# Patient Record
Sex: Male | Born: 1955 | Race: Black or African American | Hispanic: No | Marital: Married | Smoking: Current some day smoker
Health system: Southern US, Community
[De-identification: ages and names within clinical notes are randomized; demographics above are authoritative.]

## PROBLEM LIST (undated history)

## (undated) DIAGNOSIS — I10 Essential (primary) hypertension: Secondary | ICD-10-CM

## (undated) DIAGNOSIS — E119 Type 2 diabetes mellitus without complications: Secondary | ICD-10-CM

## (undated) DIAGNOSIS — D869 Sarcoidosis, unspecified: Secondary | ICD-10-CM

## (undated) DIAGNOSIS — J449 Chronic obstructive pulmonary disease, unspecified: Secondary | ICD-10-CM

## (undated) DIAGNOSIS — J189 Pneumonia, unspecified organism: Secondary | ICD-10-CM

## (undated) DIAGNOSIS — I219 Acute myocardial infarction, unspecified: Secondary | ICD-10-CM

## (undated) DIAGNOSIS — K219 Gastro-esophageal reflux disease without esophagitis: Secondary | ICD-10-CM

## (undated) DIAGNOSIS — G4733 Obstructive sleep apnea (adult) (pediatric): Secondary | ICD-10-CM

---

## 2003-09-30 ENCOUNTER — Emergency Department (HOSPITAL_COMMUNITY): Admission: EM | Admit: 2003-09-30 | Discharge: 2003-09-30 | Payer: Self-pay | Admitting: Emergency Medicine

## 2004-04-18 ENCOUNTER — Emergency Department (HOSPITAL_COMMUNITY): Admission: EM | Admit: 2004-04-18 | Discharge: 2004-04-18 | Payer: Self-pay | Admitting: Emergency Medicine

## 2005-11-07 ENCOUNTER — Emergency Department (HOSPITAL_COMMUNITY): Admission: EM | Admit: 2005-11-07 | Discharge: 2005-11-07 | Payer: Self-pay | Admitting: Emergency Medicine

## 2006-06-25 DIAGNOSIS — D860 Sarcoidosis of lung: Secondary | ICD-10-CM | POA: Diagnosis present

## 2006-08-08 ENCOUNTER — Ambulatory Visit: Payer: Self-pay | Admitting: Internal Medicine

## 2006-08-08 ENCOUNTER — Encounter (INDEPENDENT_AMBULATORY_CARE_PROVIDER_SITE_OTHER): Payer: Self-pay | Admitting: *Deleted

## 2006-08-08 ENCOUNTER — Inpatient Hospital Stay (HOSPITAL_COMMUNITY): Admission: EM | Admit: 2006-08-08 | Discharge: 2006-08-14 | Payer: Self-pay | Admitting: Emergency Medicine

## 2006-08-12 ENCOUNTER — Encounter (INDEPENDENT_AMBULATORY_CARE_PROVIDER_SITE_OTHER): Payer: Self-pay | Admitting: Specialist

## 2006-08-14 ENCOUNTER — Encounter (INDEPENDENT_AMBULATORY_CARE_PROVIDER_SITE_OTHER): Payer: Self-pay | Admitting: Internal Medicine

## 2006-08-14 DIAGNOSIS — K859 Acute pancreatitis without necrosis or infection, unspecified: Secondary | ICD-10-CM | POA: Insufficient documentation

## 2006-08-15 DIAGNOSIS — I10 Essential (primary) hypertension: Secondary | ICD-10-CM | POA: Insufficient documentation

## 2006-08-15 DIAGNOSIS — F1010 Alcohol abuse, uncomplicated: Secondary | ICD-10-CM | POA: Insufficient documentation

## 2006-08-16 ENCOUNTER — Encounter (INDEPENDENT_AMBULATORY_CARE_PROVIDER_SITE_OTHER): Payer: Self-pay | Admitting: Internal Medicine

## 2006-08-16 ENCOUNTER — Ambulatory Visit: Payer: Self-pay | Admitting: Internal Medicine

## 2006-08-16 LAB — CONVERTED CEMR LAB
BUN: 24 mg/dL — ABNORMAL HIGH (ref 6–23)
CO2: 23 meq/L (ref 19–32)
Calcium: 8.9 mg/dL (ref 8.4–10.5)
Creatinine, Ser: 1.84 mg/dL — ABNORMAL HIGH (ref 0.40–1.50)
Glucose, Bld: 123 mg/dL — ABNORMAL HIGH (ref 70–99)

## 2006-08-19 ENCOUNTER — Ambulatory Visit: Payer: Self-pay | Admitting: Internal Medicine

## 2006-08-19 ENCOUNTER — Inpatient Hospital Stay (HOSPITAL_COMMUNITY): Admission: EM | Admit: 2006-08-19 | Discharge: 2006-09-05 | Payer: Self-pay | Admitting: *Deleted

## 2006-08-20 ENCOUNTER — Ambulatory Visit: Payer: Self-pay | Admitting: Gastroenterology

## 2006-08-22 ENCOUNTER — Encounter (INDEPENDENT_AMBULATORY_CARE_PROVIDER_SITE_OTHER): Payer: Self-pay | Admitting: Internal Medicine

## 2006-09-08 ENCOUNTER — Encounter (INDEPENDENT_AMBULATORY_CARE_PROVIDER_SITE_OTHER): Payer: Self-pay | Admitting: Internal Medicine

## 2006-09-09 ENCOUNTER — Encounter (INDEPENDENT_AMBULATORY_CARE_PROVIDER_SITE_OTHER): Payer: Self-pay | Admitting: Internal Medicine

## 2006-09-24 ENCOUNTER — Ambulatory Visit: Payer: Self-pay | Admitting: *Deleted

## 2006-09-24 ENCOUNTER — Encounter (INDEPENDENT_AMBULATORY_CARE_PROVIDER_SITE_OTHER): Payer: Self-pay | Admitting: Internal Medicine

## 2006-09-24 DIAGNOSIS — K862 Cyst of pancreas: Secondary | ICD-10-CM | POA: Insufficient documentation

## 2006-09-24 DIAGNOSIS — K863 Pseudocyst of pancreas: Secondary | ICD-10-CM

## 2006-09-24 DIAGNOSIS — E639 Nutritional deficiency, unspecified: Secondary | ICD-10-CM | POA: Insufficient documentation

## 2006-09-24 LAB — CONVERTED CEMR LAB
Basophils Absolute: 0 10*3/uL (ref 0.0–0.1)
CO2: 27 meq/L (ref 19–32)
Calcium: 9.2 mg/dL (ref 8.4–10.5)
Chloride: 104 meq/L (ref 96–112)
Eosinophils Relative: 3 % (ref 0–5)
Lymphocytes Relative: 42 % (ref 12–46)
Neutro Abs: 3.7 10*3/uL (ref 1.7–7.7)
Neutrophils Relative %: 47 % (ref 43–77)
Platelets: 376 10*3/uL (ref 150–400)
Potassium: 4.2 meq/L (ref 3.5–5.3)
RDW: 15.1 % — ABNORMAL HIGH (ref 11.5–14.0)
Sodium: 138 meq/L (ref 135–145)

## 2006-10-02 ENCOUNTER — Encounter: Admission: RE | Admit: 2006-10-02 | Discharge: 2006-12-31 | Payer: Self-pay | Admitting: Emergency Medicine

## 2006-12-16 ENCOUNTER — Telehealth (INDEPENDENT_AMBULATORY_CARE_PROVIDER_SITE_OTHER): Payer: Self-pay | Admitting: Pharmacy Technician

## 2007-11-27 IMAGING — CT CT ABDOMEN W/ CM
2 of 5 series · 17 of 46 positions shown, 19 images · IV contrast (APPLIED)
Comparison: none

CLINICAL DATA: Pancreatitis, small bowel obstruction, abdominal pain

[Series 2: abd/pelv with 5.0 b31f st · axial · 0.72mm/px · z∈[-458,-8]mm · 14 of 102 slices shown, 16 images]
[im 6/102  soft-tissue]
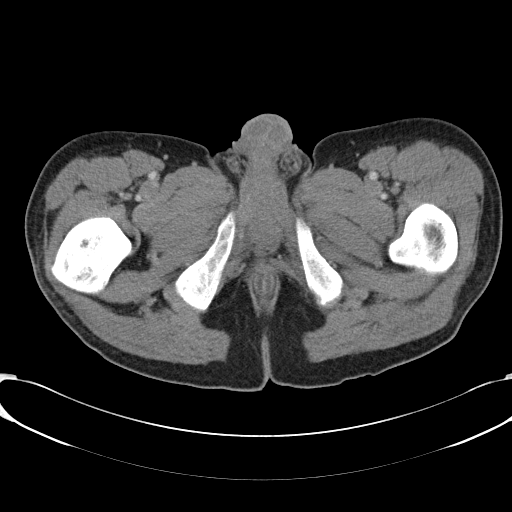
[im 6/102  bone]
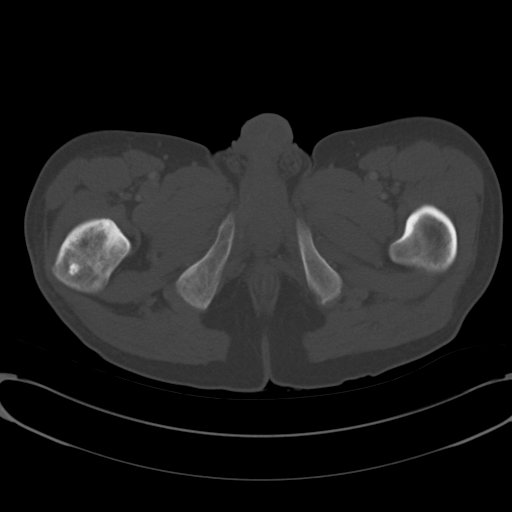
[im 16/102  soft-tissue]
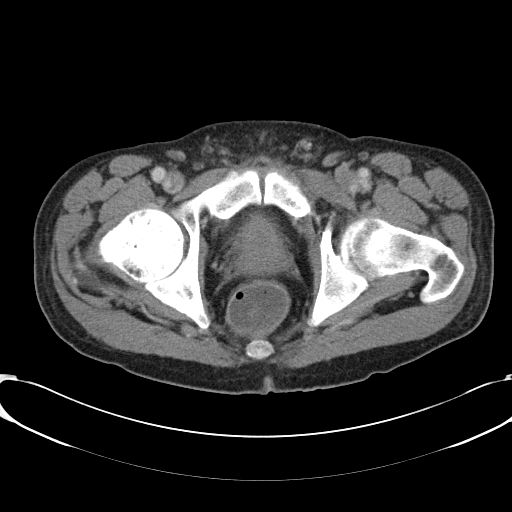
[im 21/102  soft-tissue]
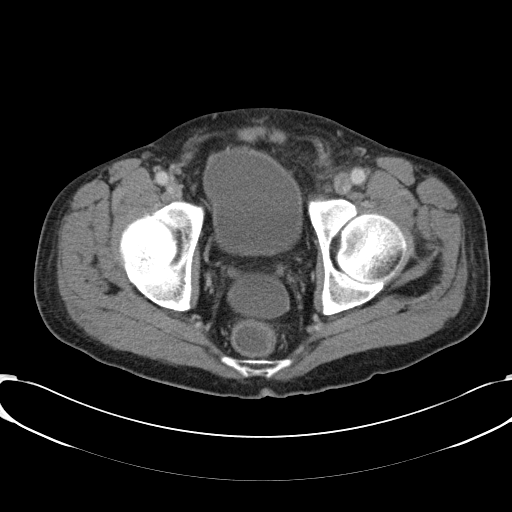
[im 26/102  soft-tissue]
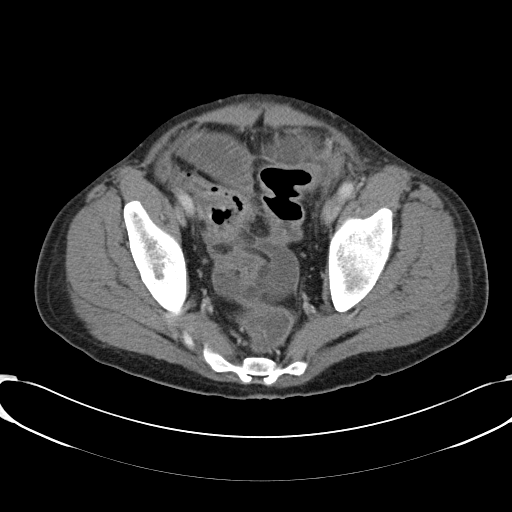
[im 36/102  soft-tissue]
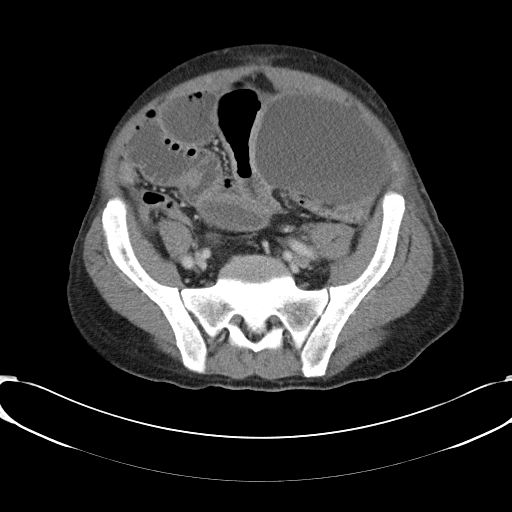
[im 41/102  soft-tissue]
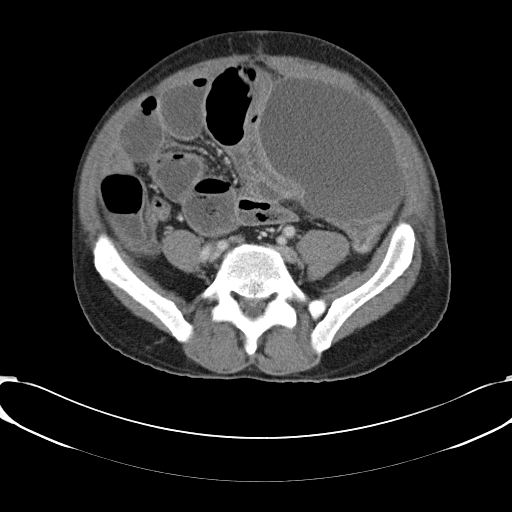
[im 46/102  soft-tissue]
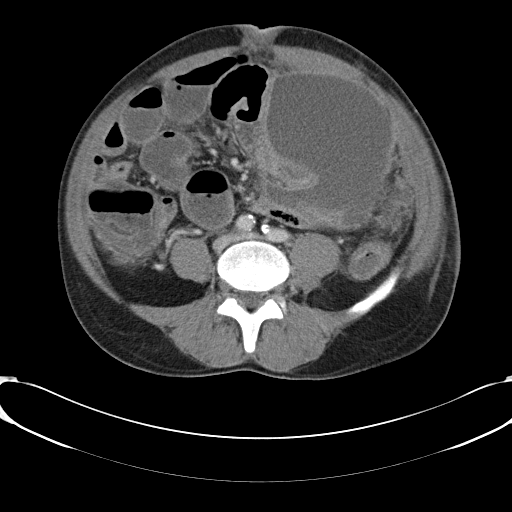
[im 56/102  soft-tissue]
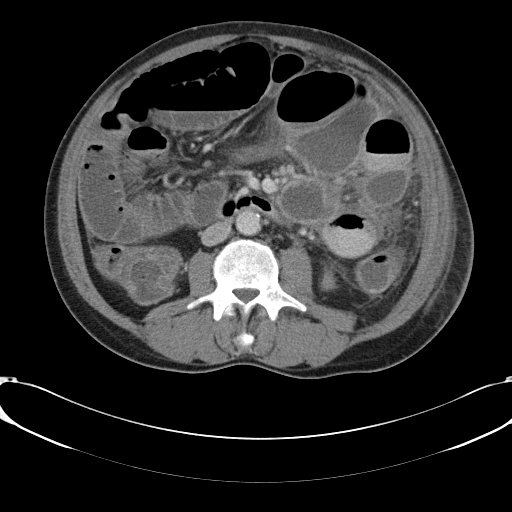
[im 61/102  soft-tissue]
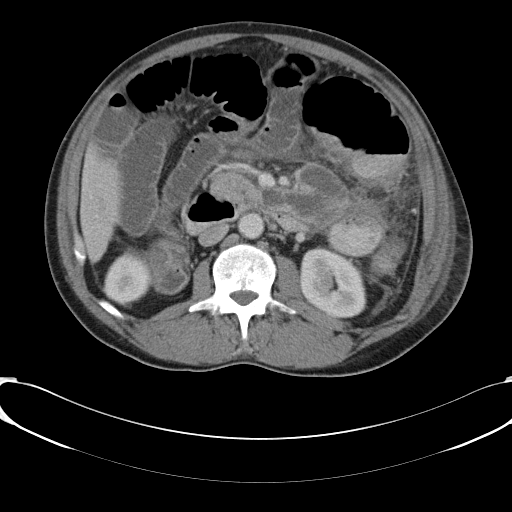
[im 61/102  bone]
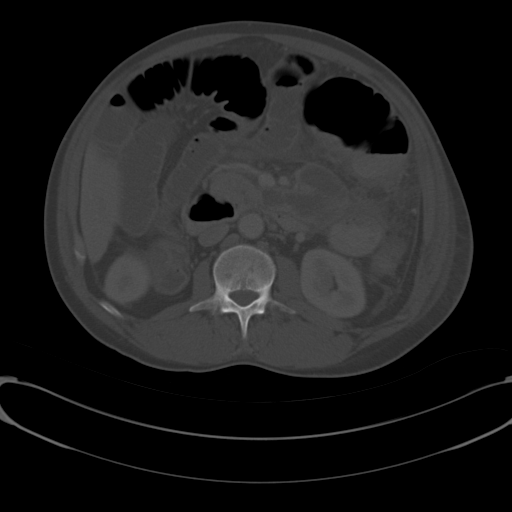
[im 66/102  soft-tissue]
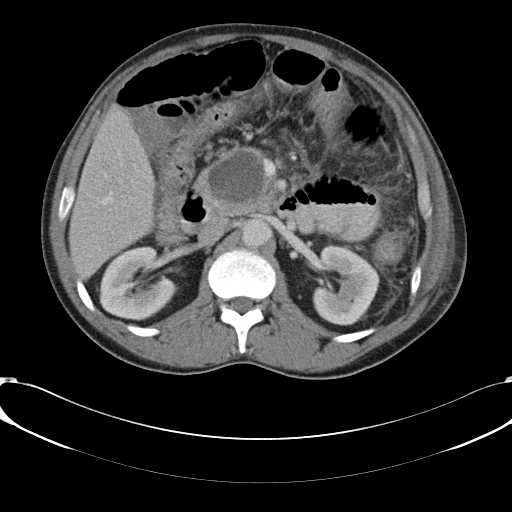
[im 76/102  soft-tissue]
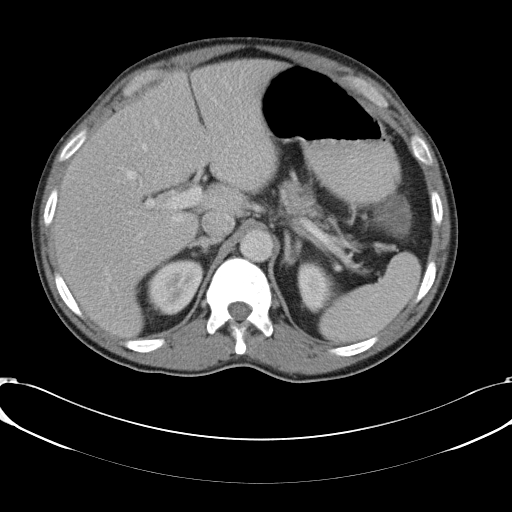
[im 81/102  soft-tissue]
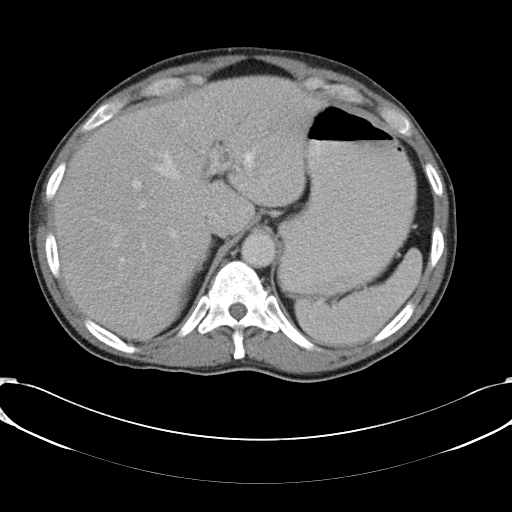
[im 86/102  soft-tissue]
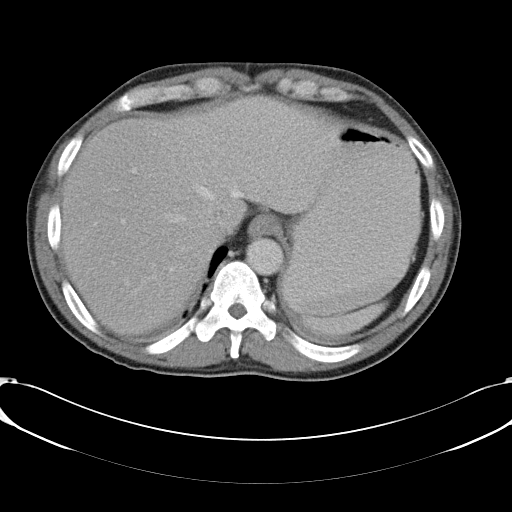
[im 96/102  soft-tissue]
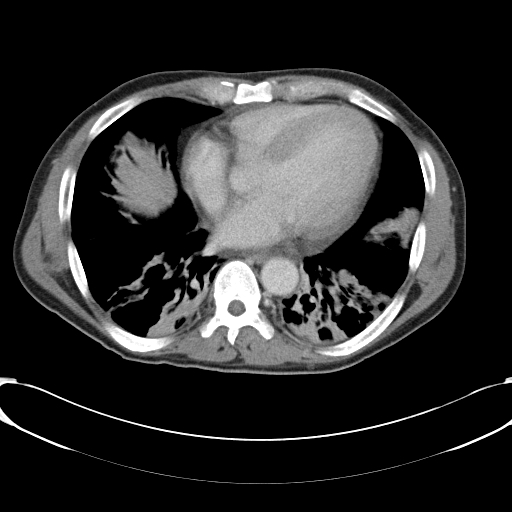

[Series 603: cor · coronal · 1.00mm/px · 3 of 132 slices shown]
[im 44/132  soft-tissue]
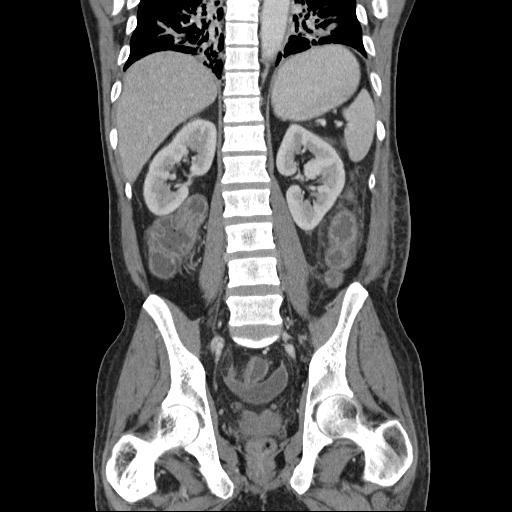
[im 59/132  soft-tissue]
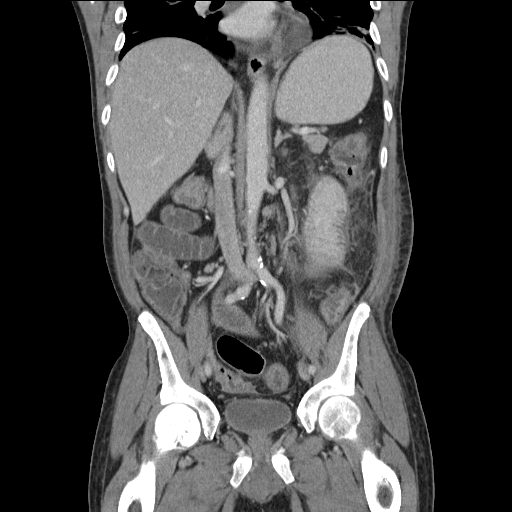
[im 73/132  soft-tissue]
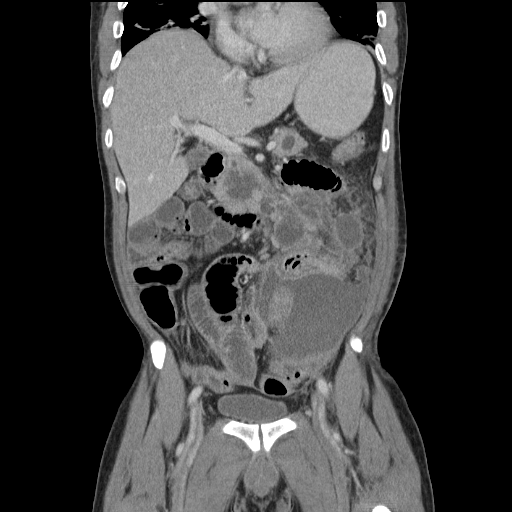

[17 of 46 positions shown; findings below may reference images not displayed]

CT abdomen with contrast:

Multidetector helical CT after 100 ml Imnipaque-WNN IV.
Comparison 08/08/2006. Patchy consolidation or atelectasis posteriorly in both
lower lobes with tiny bilateral pleural effusions. Unremarkable liver,
gallbladder, spleen, adrenal glands, kidneys. There's been development of a
number of  well-defined fluid collections adjacent to or within the pancreatic
parenchyma, largest in the region of the head measuring 4.2 cm diameter, 15 and
11 mm cystic collections noted the region the pancreatic body and tail. There
has been progression of interloop fluid collections in the left midabdomen, some
loculated and separate, others possibly confluent with a new large left lower
peritoneal fluid collection measured   14 cm transverse diameter. It  displaces
adjacent loops of bowel. There is poor progression of oral contrast material
with multiple fluid filled distended  mid small bowel loops. The distal ileum is
decompressed. There seems to be a transition zone in the right midabdomen
without any discrete etiology. No free air.
IMPRESSION: 1. Interval development of multiple peripancreatic and peritoneal fluid
collections which may represent pseudocysts or abscesses, largest in the left
lower abdomen 14 cm diameter.
2. Small bowel ileus or obstruction.

Findings were discussed Dr. Marlin at the time of interpretation.

CT pelvis with contrast:

The colon is nondilated, unremarkable. Small amount of pelvic fluid. Urinary
bladder incompletely distended. No adenopathy.
IMPRESSION: 1. Small amount of pelvic fluid.

## 2007-11-29 IMAGING — CT CT ABCESS DRAINAGE
1 series · 16 of 32 positions shown, 20 images · non-contrast
Comparison: none

CLINICAL DATA: Pancreatitis. Intraabdominal abscess.
 CT GUIDED LEFT LOWER QUADRANT ABSCESS DRAIN:
 Procedure:  The left lower quadrant was prepped and draped in a sterile fashion and Lidocaine was utilized for local anesthesia.  Under CT guidance, a 19-gauge needle was inserted into the left lower quadrant abscess and removed over an Amplatz wire.  A 12 French multipurpose locking drain was inserted. It was looped and string-fixed. It was sewn to the skin.  No complications. Frank pus was aspirated.

[Series 2: abd pelvis · axial · 0.70mm/px · z∈[-306,-146]mm · 16 of 74 slices shown, 20 images]
[im 5/74  soft-tissue]
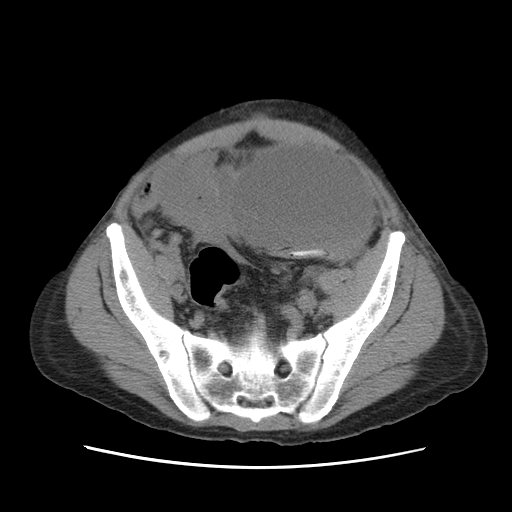
[im 5/74  bone]
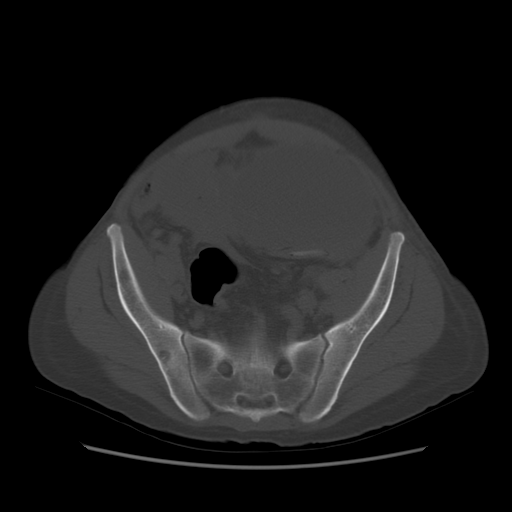
[im 10/74  soft-tissue]
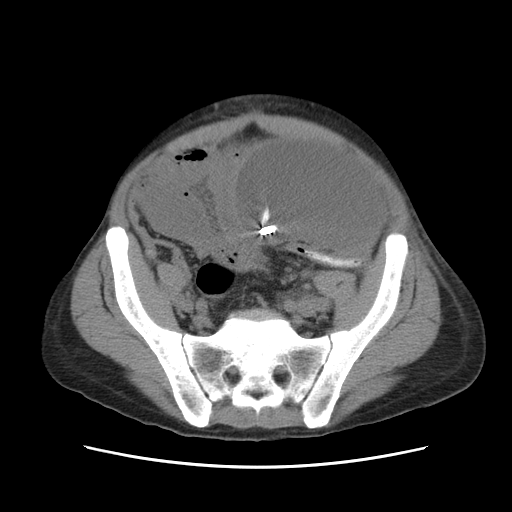
[im 15/74  soft-tissue]
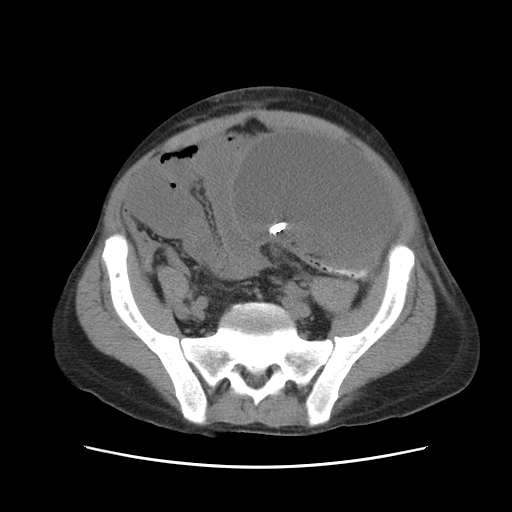
[im 19/74  soft-tissue]
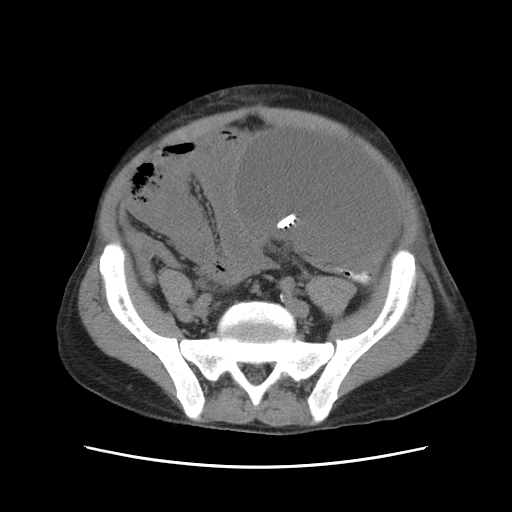
[im 24/74  soft-tissue]
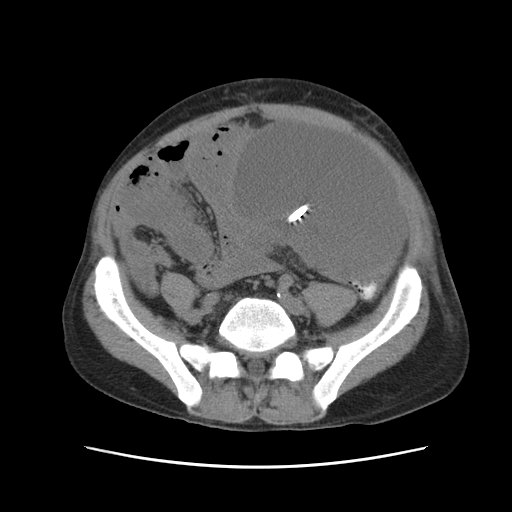
[im 29/74  soft-tissue]
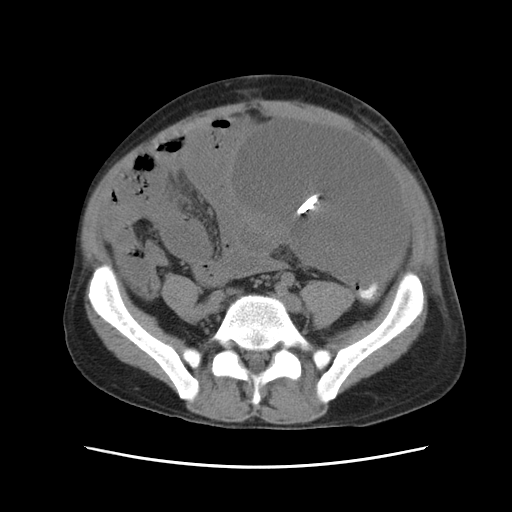
[im 33/74  soft-tissue]
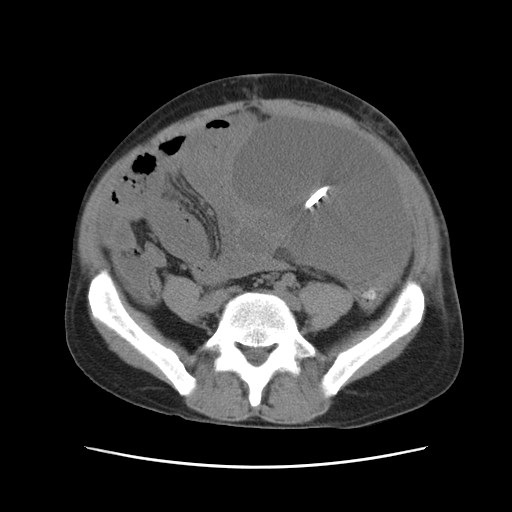
[im 41/74  soft-tissue]
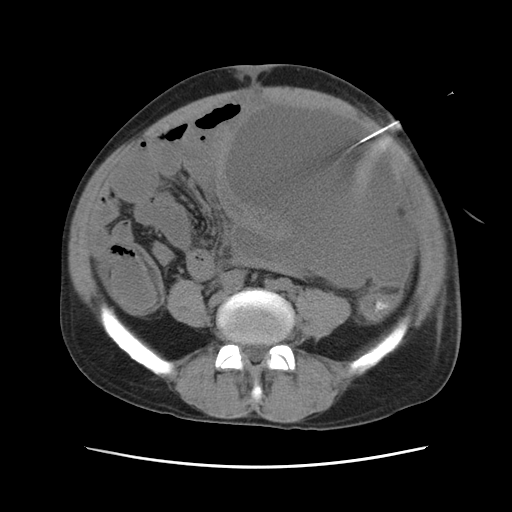
[im 45/74  soft-tissue]
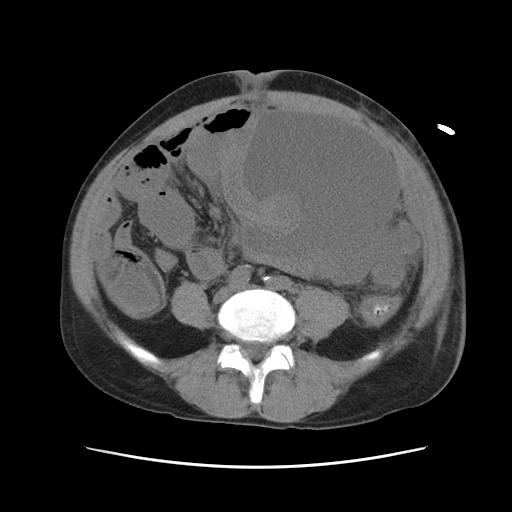
[im 45/74  bone]
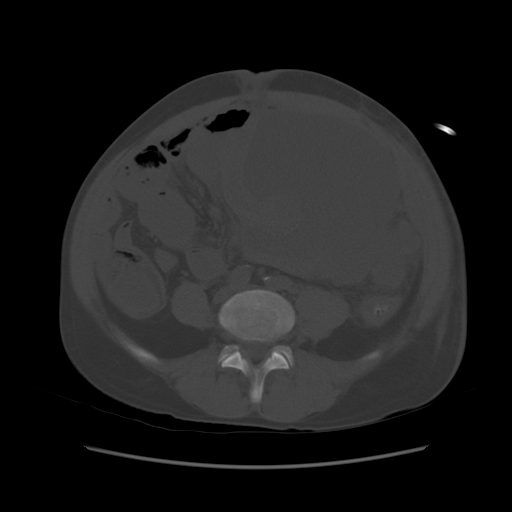
[im 50/74  soft-tissue]
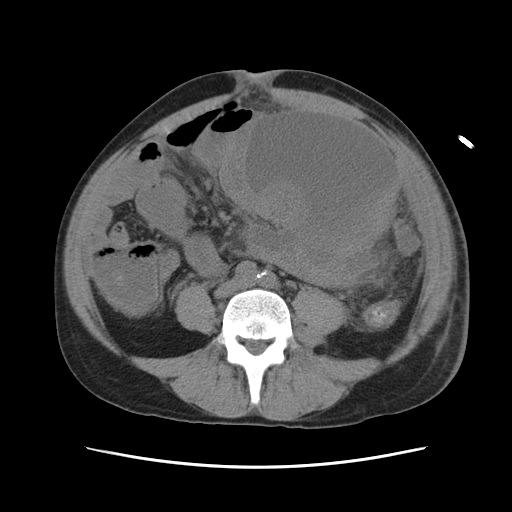
[im 55/74  soft-tissue]
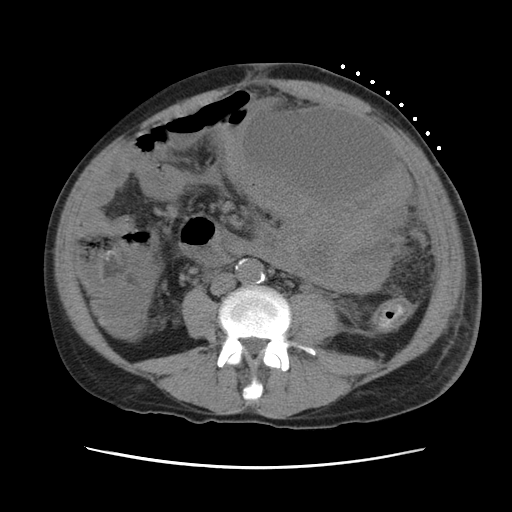
[im 59/74  soft-tissue]
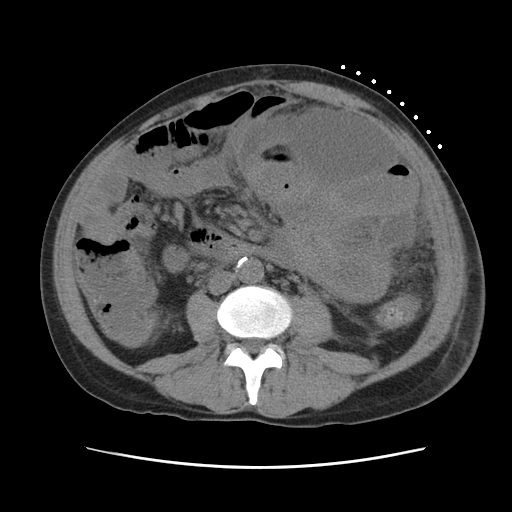
[im 64/74  soft-tissue]
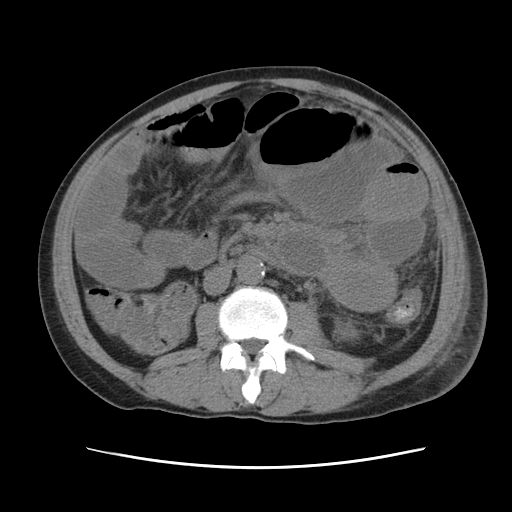
[im 64/74  lung]
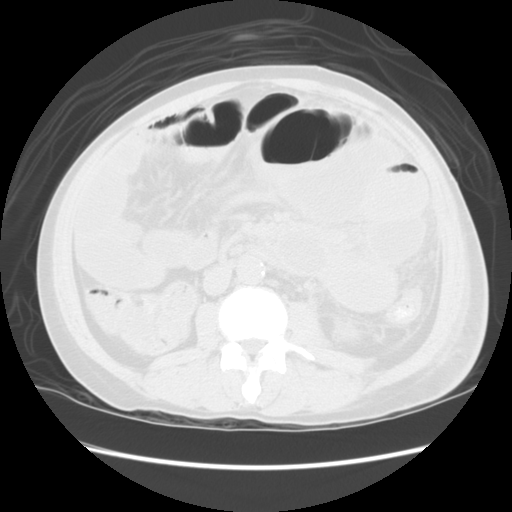
[im 66/74  lung]
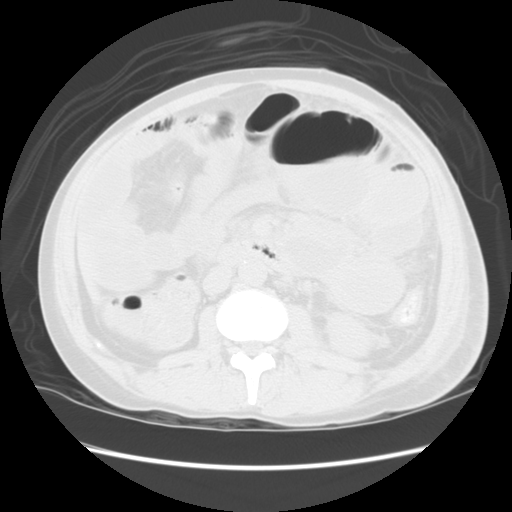
[im 69/74  soft-tissue]
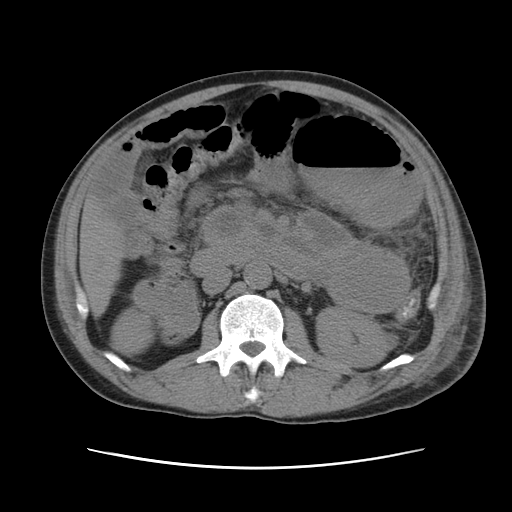
[im 69/74  lung]
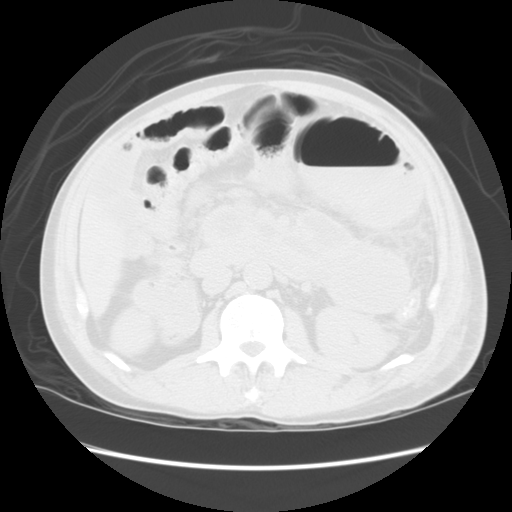
[im 71/74  lung]
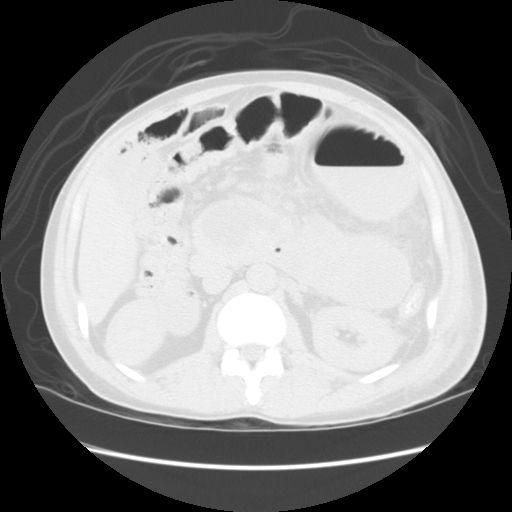

[16 of 32 positions shown; findings below may reference images not displayed]

FINDINGS: Images document placement of a 12 French multipurpose locking drain into a large left lower quadrant abscess.
IMPRESSION: Successful left lower quadrant abscess drainage.

## 2009-02-14 ENCOUNTER — Encounter: Admission: RE | Admit: 2009-02-14 | Discharge: 2009-02-14 | Payer: Self-pay | Admitting: Specialist

## 2009-04-01 ENCOUNTER — Ambulatory Visit (HOSPITAL_COMMUNITY): Admission: RE | Admit: 2009-04-01 | Discharge: 2009-04-01 | Payer: Self-pay | Admitting: Cardiology

## 2010-05-10 ENCOUNTER — Encounter: Admission: RE | Admit: 2010-05-10 | Discharge: 2010-05-10 | Payer: Self-pay | Admitting: Specialist

## 2010-07-16 ENCOUNTER — Encounter: Payer: Self-pay | Admitting: Internal Medicine

## 2010-09-28 LAB — BASIC METABOLIC PANEL
BUN: 16 mg/dL (ref 6–23)
Calcium: 9.6 mg/dL (ref 8.4–10.5)
GFR calc non Af Amer: 50 mL/min — ABNORMAL LOW (ref >60)
Glucose, Bld: 130 mg/dL — ABNORMAL HIGH (ref 70–99)
Potassium: 3.3 mEq/L — ABNORMAL LOW (ref 3.5–5.1)
Sodium: 142 mEq/L (ref 135–145)

## 2010-11-10 NOTE — Consult Note (Signed)
NAME:  JAMESON, TORMEY NO.:  [id number]   MEDICAL RECORD NO.:  [id number]          PATIENT TYPE:  INP   LOCATION:  2927                         FACILITY:  [facility]   PHYSICIAN:  Jordan Hawks. Elnoria Howard, MD    DATE OF BIRTH:  07-28-1955   DATE OF CONSULTATION:  08/09/2006  DATE OF DISCHARGE:                                 CONSULTATION   REASON FOR CONSULTATION:  Pancreatitis and pancreatic head mass.   HISTORY OF PRESENT ILLNESS:  This is a 55 year old gentleman without any  significant past medical history who presents to the emergency room with  complaints of abdominal pain.  The patient states that his pain started  one day of prior to this admission and became quite intense, that it was  not controlled with the use of Motrin.  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  The  patient does have a history of alcohol abuse; however, he states that he  has been decreasing his alcohol intake from less than a pint per day at  this time.  His last use of alcohol was on Monday.  Interventional  radiology was consulted to evaluate the patient for a percutaneous  biopsy of the pancreatic head mass; however, the window in order to  obtain samples was limited and, therefore, no biopsy was performed.  Subsequently, GI consultation is requested for further evaluation with  endoscopic ultrasound.   Past medical history and past surgical history is as stated above.  Family history is noncontributory.   SOCIAL HISTORY:  Positive for alcohol use and half pack per day smoker  for the past 40 years.  The patient is married and works. No history of  illicit drug use.   REVIEW OF SYSTEMS:  Negative for fevers, chills, chest pain, shortness  of breath, dizziness, vertigo or dysphagia, constipation, diarrhea,  dysuria, new skin rashes, arthritis, arthralgias or  neurologic  complaints.   MEDICATIONS:  Folic acid 1 mg p.o. daily, Lopressor 7.5 mg IV q.6h.,  morphine PCA, Protonix 40 mg q.12h., and vitamin B1, Reglan 10 mg IV  q.6h.   PHYSICAL EXAMINATION:  VITAL SIGNS:  Blood pressure was 201/114, heart  rate is 125, pulse ox 100% on room air.  GENERAL:  The patient is in no acute distress.  However, he does appear  to be slightly diaphoretic.  HEENT:  Normocephalic, atraumatic.  Extraocular muscles intact.  Pupils  equal, round, and reactive to light.  NECK:  Supple.  No lymphadenopathy.  LUNGS:  Clear to auscultation bilaterally.  CARDIOVASCULAR:  Regular rate and rhythm.  ABDOMEN:  Flat, soft, nontender, nondistended.  EXTREMITIES:  No clubbing, cyanosis or edema.   LABORATORY VALUES:  White blood cell count is 14.5, hemoglobin 12.8,  platelets at 275.  Sodium is 136, potassium 4.1, chloride is 106, CO2  23, BUN 15, creatinine 1.2, glucose is 101.   IMPRESSION:  1. Pancreatic head mass.  2. Pancreatitis.  3. Alcohol abuse.  4. Hypertension.   For further evaluation, I believe the patient does require an endoscopic  ultrasound for possible biopsy.  There is a possible hemorrhagic  component associated with this heterogenous pancreatic head mass,  however, Doppler ultrasounds can be performed during the procedure to  evaluate for any evidence of an aneurysm in that area.   PLAN:  1. Schedule for endoscopic ultrasound.  2. Continue with pain medications.  3. Check a CA99.  4. Discontinue Reglan.      Jordan Hawks Elnoria Howard, MD  Electronically Signed     PDH/MEDQ  D:  08/09/2006  T:  08/09/2006  Job:  [id number]

## 2010-11-10 NOTE — Consult Note (Signed)
NAME:  Charles Krueger, Charles Krueger NO.:  [id number]   MEDICAL RECORD NO.:  [id number]          PATIENT TYPE:  INP   LOCATION:  5155                         FACILITY:  [facility]   PHYSICIAN:  Sandria Bales. Ezzard Standing, M.D.  DATE OF BIRTH:  04-14-1956   DATE OF CONSULTATION:  08/19/2006  DATE OF DISCHARGE:                                 CONSULTATION   REASON FOR CONSULTATION:  Small bowel obstruction.   HISTORY OF PRESENT ILLNESS:  This is a 55 year old black male who has  gone to [facility] for his medical care.  He is being admitted  by the medicine teaching service.  He was recently hospitalized from  February 14 to August 14, 2006 for acute pancreatitis with  inflammatory pancreatic mass felt to be secondary to alcohol abuse.  His  other discharge diagnoses include:  1. Hypertension.  2. Tobacco abuse.  3. Airspace disease on CT.  4. Mediastinal lymphadenopathy which resolved.  5. Proteinuria.   He was recently admitted on February 14th and discharged on February  20th.  After discharge, he did well for a few days but then had  increasing abdominal distention, loose stools, worsening appetite and  represented to the [facility].   During his last admission, he was seen by Dr. Jordan Hawks. Hung from a GI  standpoint for this pancreatitis and mass in the head of his pancreas.   PAST MEDICAL HISTORY:  He was on Lopressor for his blood pressure.  He  has no allergies.   REVIEW OF SYSTEMS:  NEUROLOGICAL:  No seizure or loss of consciousness.  PULMONARY:  He smoked about a half to a pack of cigarettes a day until  the August 08, 2006.  He says he has quit smoking since that time.  CARDIAC:  He has been hypertensive for a while.  Again, discharged home  on antihypertensives.  GASTROINTESTINAL:  He denies any history of peptic ulcer disease, liver  disease.  Again, he had his recent pancreatitis but this was his first  bout of pancreatitis.  He had  never had any pancreatic trouble before  this recent episode.  He knows of colonic disease.  He had no prior  abdominal surgery.  UROLOGIC:  No history of kidney stones or kidney infections, though he  was diagnosed with proteinuria during his last admission.   PHYSICAL EXAMINATION:  He has no vital signs in the computer at this  time.  His height is 5 feet 9 inches.  His pulse is about 100.  GENERAL:  He is a well-nourished black gentleman who is alert and  cooperative with poor dentition.  NECK:  is supple without mass, without thyromegaly.  LUNGS: show symmetric breath sounds without rub.  HEART: has a regular rate and rhythm.  ABDOMEN: is distended.  He is somewhat tender along his left flank but  he has no guarding and no rebound.  He has active bowel sounds.  He has  no hernia that I could feel.  No organomegaly, though again he is  distended enough that may limit physical exam.  I did not do a rectal  exam on him.  EXTREMITIES: has good strength in all four extremities.  Neurologically  is grossly intact.   Labs in the computer show a white blood count of 18,900, hemoglobin of  13, hematocrit of 40.  His platelet count is 611,000.  His neutrophils  87%.  His sodium is 135, potassium 4.6, CO2 is 101, BUN 78, creatinine  2.3.  His alkaline phosphate is 141.  Albumin is 2.4.  His lipase is  135.   Review of his x-ray show dilated small bowel consistent with either  ileus versus small bowel obstruction.   DIAGNOSES:  1. Small bowel obstruction versus ileus, possibly related to his      recent pancreatitis.  If his symptoms do not rapidly resolve with      intravenous hydration and diet restriction, he will need a CT scan      to reevaluate his pancreatitis.  There is certainly a chance he      developed a pancreatic pseudocyst or increasing inflammation which      has led to these changes of his small bowel.  I do not think he has      intrinsic small bowel disease.  He has  had no prior abdominal surgery; therefore, I doubt he has a  history of adhesions.  I agree with the plan for IV fluids, n.p.o., repeat x-rays and labs in  the morning.  I would also get an amylase on him.  1. Leukocytosis:  I think it is probably more related to his      pancreatitis than small bowel disease.  2. Dehydration with elevated creatinine/BUN.  3. Elevated lipase secondary to residual pancreatitis.  4. History of alcohol abuse.  5. Recently stopped smoking.  6. Hypertension.  7. Proteinuria on his previous admission.  8. Malnutrition with an albumin of 2.4 in the face of dehydration, so      it is probably worse.      Sandria Bales. Ezzard Standing, M.D.  Electronically Signed     DHN/MEDQ  D:  08/20/2006  T:  08/20/2006  Job:  [id number]   cc:   Duncan Dull, M.D.  Chauncey Reading, D.O.  Jordan Hawks Elnoria Howard, MD

## 2010-11-10 NOTE — Discharge Summary (Signed)
NAME:  Charles Krueger, Charles Krueger NO.:  [id number]   MEDICAL RECORD NO.:  [id number]          PATIENT TYPE:  INP   LOCATION:  5736                         FACILITY:  [facility]   PHYSICIAN:  Fransisco Hertz, M.D.  DATE OF BIRTH:  03-May-1956   DATE OF ADMISSION:  DATE OF DISCHARGE:  09/05/2006                               DISCHARGE SUMMARY   DISCHARGE DIAGNOSES:  1. Acute pancreatitis with pseudocyst formation.  2. Multiple intraperitoneal abscesses, status post percutaneous      drainage.  3. Small bowel obstruction - resolved.  4. Clostridium difficile colitis - resolved.  5. Acute renal failure, likely secondary to ACE inhibitor - resolved.  6. Hypertension.  7. Anemia secondary to acute illness.  8. History of alcohol abuse.  9. History of tobacco abuse.  10.History of mediastinal lymphadenopathy seen on CT scan in May of      2007 - resolved.   DISCHARGE MEDICATIONS:  1. Norvasc p.o. daily.  2. Metoprolol 100 mg p.o. b.i.d.  3. Clonidine 0.2 mg p.o. b.i.d.  4. Protonix 40 mg p.o. daily.  5. Multivitamin one tablet p.o. daily.  6. Tylenol 650 mg p.o. q.6 h p.r.n. pain.  7. Resource Shake one can p.o. b.i.d.   DISPOSITION AND FOLLOW-UP:  At the time of discharge, the patient was in  stable condition.  His abdominal pain had resolved, as had his diarrhea.  He was tolerating a full diet without nausea, vomiting, or abdominal  discomfort.  He is scheduled to follow up in the [facility] with Dr. Okey Dupre on April 1st at 9:45 a.m.  The patient should be  referred back to Dr. Ezzard Standing at that time for follow-up evaluation.  He  was instructed to not return to work for at least one month following  discharge.   PROCEDURES PERFORMED:  1. Acute abdominal series:  This was performed on August 19, 2006,      and revealed mid to distal small bowel obstruction.  Low lung      volumes with bibasilar subsegmental atelectasis was also noted.  2. CT scan of the  abdomen and pelvis:  This was performed on August 21, 2006, and showed interval development of multiple      peripancreatic and peritoneal fluid collections which may represent      pseudocyst or abscesses.  The largest was located in the left lower      abdomen and measured 14 cm in diameter.  Small bowel ileus or      obstruction was also observed.  3. CT-guided percutaneous abscess drainage:  This was performed by Dr.      Maryclare Bean on August 23, 2006, with the successful placement of a      12-French multipurpose blocking drain inserted into the 14-cm fluid      collection in the left lower abdomen.  Purulent drainage was      obtained and sent for analysis.  4. CT of the abdomen with contrast:  This was performed on August 26, 2006,  and showed interloop abscesses in the left mid abdomen that      were slightly more prominent and demonstrated more enhancement and      irregularity than on previous studies.  The pseudocyst in the head      of the pancreas also demonstrated evidence of probable infection.      Decompression of the majority of the large abscess in the left      lower quadrant was noted.  A new superior mesenteric vein      thrombosis was also visualized.  5. CT-guided percutaneous abscess drain placement:  This was performed      by Dr. Irish Lack on August 30, 2006.  A 10-French drain was      successfully placed in the left peripancreatic abscess.  Brownish,      purulent fluid was aspirated and sent for analysis.  6. CT of the abdomen with contrast:  This was performed on September 04, 2006, and showed nearly complete resolution of the peripancreatic      abscess with only a tiny amount of fluid remaining adjacent to the      drainage catheter.  There was also significant interval decrease in      the size of the pseudocyst in the pancreatic head.  A non-occlusive      thrombus in the superior mesenteric vein extending into the portal      confluence  was found to be more prominent than in prior studies but      did not demonstrate over occlusion of the mesenteric or portal      vein.   CONSULTATIONS:  1. Dr. Ovidio Kin, [facility].  2. Dr. Maryclare Bean, [facility].  3. Dr. Irish Lack, [facility].   BRIEF ADMITTING HISTORY AND PHYSICAL:  Mr. Mcfadyen is a 55 year old man  with a history of alcoholic pancreatitis who was recently admitted to  the teaching service from February 14 through August 14, 2006, for  acute pancreatitis and an inflammatory pancreatic mass.  He presented  again to the emergency department with a 3-day history of increasing  left lower quadrant pain and swelling.  This had been accompanied by  loose, watery stools for 3 days.  Additionally, he had been unable to  eat anything since one day after his previous discharge; however, he had  been able to drink liquids without significant problems.  He denied  having any nausea or vomiting, as well as hematochezia or melena.   PHYSICAL EXAMINATION:  VITAL SIGNS:  Temperature 97.3, blood pressure  153/108, pulse 89, respirations 20, oxygen saturation 97% on room air.  GENERAL:  The patient is lying on a stretcher in no acute distress.  HEENT:  Pupils are equal, round, and reactive to light and  accommodation.  Extraocular eye movements are intact.  The sclerae are  anicteric.  The patient has fair dentition with moist mucous membranes.  NECK:  Supple without lymphadenopathy.  RESPIRATORY:  Good air movement is noted.  Bilateral basilar crackles  are auscultated.  CARDIOVASCULAR:  The patient has a regular rate and rhythm without  murmurs.  ABDOMEN:  Bowel sounds are absent.  The abdomen is firm to palpation  with prominent tenderness in the left lower quadrant.  No rebound or  guarding is present; however, the abdomen appears to be markedly  distended. RECTAL:  Exam reveals normal rectal tone with soft yellowish to light  brown  stool in the vault.  No gross blood is noted, and the stool is  fecal-occult negative.  EXTREMITIES:  No clubbing, cyanosis, or edema is present.  SKIN:  This is warm and dry without remarkable lesions.  NEUROLOGIC:  The exam is grossly intact.   LABS:  White blood cell count 18.9, ANC 16.5, hemoglobin 13.9, MCV 96,  platelets 611.  Sodium 135, potassium 4.6, chloride 100, bicarbonate 22,  BUN 78, creatinine 2.1 (discharge creatinine 1.4 on August 14, 2006),  glucose 105, anion gap 13, total bilirubin 0.6, alkaline phosphatase  141, AST 14, ALT 16, total protein 7.5, albumin 2.4, calcium 9.5, lipase  135 (previously 670 on August 08, 2006).  Urinalysis remarkable for 30  of protein.   HOSPITAL COURSE BY PROBLEM:  1. Acute pancreatitis with pseudocyst formation and multiple      intraabdominal abscesses:  Given Mr. Klemann history of recent      pancreatitis, we were concerned that he had a flare of this again.      He was admitted to the hospital for intravenous pain control and      was kept n.p.o.  Interestingly, his lipase, though elevated, was      much improved from the previous measurement during his last      hospitalization.  A CT scan obtained shortly after admission      revealed a new pancreatic pseudocyst, as well as multiple fluid      collections within the abdomen.  Empiric antibiotic therapy was      started with Unasyn.  Percutaneous drain placement was performed by      Interventional Radiology and revealed purulent material that showed      gram-positive cocci in pairs and gram-negative rods but no growth.      General Surgery was also consulted and felt that conservative      management with bowel rest and percutaneous abscess drainage would      be most appropriate.  A peripherally-inserted central catheter was      placed and parenteral nutrition was started.  The patient initially      improved but subsequently developed worsening abdominal pain and      one  episode of bloody drainage from his percutaneous abscess drain.      At that time, a repeat CT scan was performed and showed a new fluid      collection in the abdomen.  Antibiotic therapy was switched from      Unasyn to Primaxin.  The previous percutaneous drain was removed      and a new one placed into the new fluid collection.  This also      revealed purulent material that showed gram-positive coccyx in      pairs on gram stain but had no growth.  As drainage decreased, a      repeat CT scan was performed, which showed almost complete      resolution of this fluid collection on the day prior to discharge.      The drain was subsequently removed and antibiotic therapy      discontinued.  At that time, the patient's white blood cell count      had returned to normal and he had remained afebrile for more than      72 hours.  Oral intake was gradually restarted, which the patient     tolerated without difficulty.  At the time of discharge, he was      taking a full diet without pain,  nausea, or vomiting.  He was      encouraged to eat as tolerated, and to drink Resource supplements      twice daily.  The patient should be referred back to Dr. Ezzard Standing for      followup when he has been seen in the outpatient clinic.  2. Small bowel obstruction:  At the time of presentation, the      patient's abdomen was markedly distended.  Abdominal films obtained      at that time were consistent with a high-grade small bowel      obstruction.  The patient was placed on bowel rest but did not wish      to have a nasogastric tube placed.  A subsequent CT scan confirmed      either an ileus or small bowel obstruction, which was likely due to      his severe pancreatitis and intraabdominal abscesses.  As these      resolved, the small bowel obstruction also improved and his abdomen      became considerably less distended.  At the time of discharge, he      had no remaining signs or symptoms of bowel  obstruction.  He was      having normal bowel movements and denied nausea and vomiting while      taking a regular diet.  3. Clostridium difficile colitis:  At the time of admission, the      patient reported a 3-day history of watery diarrhea.  Since he had      been recently hospitalized and was taking Cipro following      aspiration of a pancreatic mass during his previous      hospitalization, Clostridium difficile was suspected.  The      patient's stool was positive for C. difficile toxin.  Because the      patient was unable to take anything by mouth, intravenous      metronidazole was started and continued for a total course of 14      days.  The patient's diarrhea rapidly improved, and he was stooling      normally at the time of discharge.  At the time of completion of      his metronidazole course, 3 stool samples were sent for Clostridium      difficile toxin analysis and were found to be negative.  4. Acute renal failure:  On admission, the patient's creatinine was      markedly increased at 2.1.  He has no known history of renal      disease, though a creatinine at the time of discharge during his      last hospitalization was mildly elevated at 1.4.  Additionally, a      follow-up basic metabolic panel obtained in the outpatient clinic      three days prior to presentation revealed a further elevation in      the patient's creatinine.  At that time, he was instructed to stop      taking lisinopril, as it was felt that the ACE inhibitor may have      precipitated this.  During this hospitalization, the patient was      initially hydrated aggressively with resolution of the acute renal      insufficiency.  Further renovascular evaluation was not performed;      however, ACE inhibitors and angiotensin receptor blockers should be      avoided in the future.  Additionally, because  of the patient's     longstanding hypertension, which has been somewhat difficult to      control,  evaluation for bilateral renal artery stenosis should be      considered in the future.  5. Hypertension:  The patient has a history of poorly-controlled      hypertension, though it was only moderately elevated at the time of      presentation.  Because he was unable to take any oral medications,      he was treated with a combination of intravenous metoprolol and as      needed labetalol.  His blood pressures remained significantly      elevated, at which time,transdermal Clonidine was added.  His blood      pressures improved and he was transitioned to a regimen of oral      metoprolol, clonidine, and amlodipine prior to discharge.  His      blood pressure should be rechecked when he returns for followup and      adjustments to his regimen made as necessary.  As noted above, ACE      inhibitors and angiotensin receptor blockers should be avoided      secondary to precipitation of acute renal failure in the past.  6. History of alcohol abuse:  Mr. Sar has a history of significant      alcohol abuse which likely precipitated his initial episode of      acute pancreatitis for which he was hospitalized last month.  He      denies drinking any additional alcohol since his last discharge.      He was initially started on thiamine and folic acid, which were      also supplemented through his parenteral nutrition.  The patient      was asked to continue taking a multivitamin after discharge.  He      was also counseled on the risks of continued alcohol use, including      recurrence or worsening of his pancreatitis.  He was provided with      contact information for [employer] at the time of      discharge.  7. Anemia:  The patient was found to have a normal hemoglobin at the      time of admission.  However, this subsequently trended downwards      and was 8.8 at the time of discharge.  It was felt that this was      most likely due to his acute disease superimosed on chronic  anemia      secondary to longstanding alcohol abuse.  He did not have any      active bleeding at the time of discharge and did not exibit any      signs or symptoms that would warrant transfusion.  Followup is      recommended when the patient is seen in the clinic.   DISCHARGE LABS:  White blood cell count 10.3, hemoglobin 8.8, hematocrit  25.6, platelets 416, sodium 141, potassium 3.5, chloride 107,  bicarbonate 25, BUN 13, creatinine 0.9, glucose 160, total bilirubin  0.4, alkaline phosphatase 321, AST 15, ALT 13, total protein 6.1,  albumin 1.9, calcium 8.3, phosphorus 3.4, magnesium 2.0, amylase 104,  lipase 108.   DISCHARGE VITAL SIGNS:  Temperature 98.6, blood pressure 176/85, pulse  72, respirations 18, oxygen saturation 94% on room air.      Yvonne Kendall, M.D.  Electronically Signed      Marcial Pacas  Lubertha Sayres, M.D.  Electronically Signed   CE/MEDQ  D:  09/07/2006  T:  09/07/2006  Job:  [id number]   cc:   Sandria Bales. Ezzard Standing, M.D.

## 2010-11-10 NOTE — Discharge Summary (Signed)
NAME:  Charles Krueger, Charles Krueger NO.:  [id number]   MEDICAL RECORD NO.:  [id number]          PATIENT TYPE:  INP   LOCATION:  5707                         FACILITY:  [facility]   PHYSICIAN:  Alvester Morin, M.D.  DATE OF BIRTH:  [birth year]   DATE OF ADMISSION:  08/08/2006  DATE OF DISCHARGE:  08/14/2006                               DISCHARGE SUMMARY   DISCHARGE DIAGNOSES:  1. Acute pancreatitis with inflammatory pancreatic mass.  2. Hypertensive urgency.  3. Alcohol abuse.  4. Tobacco abuse.  5. Asymptomatic patchy airspace disease seen on CT.  6. History of mediastinal lymphadenopathy - resolved.  7. Proteinuria.   DISCHARGE MEDICATIONS:  1. Metoprolol 100 mg p.o. b.i.d.  2. Lisinopril 5 mg p.o. daily.  3. Ciprofloxacin 500 mg p.o. b.i.d., last dose to be taken on the      morning of August 22, 2006.  4. Multivitamin 1 tablet p.o. daily.   DISPOSITION AND FOLLOWUP:  At the time of discharge, the patient was in  good condition.  His abdominal pain had completely resolved and he was  tolerating a regular diet without difficulty.  Additionally, his blood  pressure though still high, was much improved from the time of  admission.  He denied having a headache, dizziness, chest pain, and  shortness of breath.  He is scheduled to return to the outpatient clinic  for a stat basic metabolic panel on August 16, 2006 to monitor his  renal function.  He will also follow up with Dr. Elnoria Howard of [facility] on February 27 at 2 p.m. for reevaluation of his  pancreatitis and pancreatic mass.  A follow-up CT scan will be performed  at [facility] on September 11, 2006, to reevaluate his pancreatic  mass.  Finally, the patient will be contacted by the outpatient clinic  with a follow-up appointment so that he can be established with a  primary care physician.   PROCEDURES PERFORMED:  1. CT scan of the abdomen and pelvis without contrast.  This      examination  was severely limited without p.o. or IV contrast.  Soft      tissue and possibly blood centered at the uncinate process of the      pancreas and the third portion of the duodenum was seen.  A left      adrenal nodule, likely an adenoma, was also found.  Patchy      bibasilar air-space disease which may relate to infection was      noted.  A small amount of free fluid was seen in the pelvis.  The      right femoral head had a sclerotic appearance which could represent      avascular necrosis.  2. CT scan of the chest, abdomen, and pelvis with contrast.  This was      performed on August 08, 2006, and revealed patchy bilateral air-      space disease most consistent with infection.  Thoracic      lymphadenopathy had resolved from the previous scan.  Small lung  nodules previously noted were stable to improved since the prior      examination.  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      Involvement of the superior mesenteric vein was also noted, as well      as enlargement of nodes in the root of the small bowel mesentery.      Thickening of the descending colon and jejunal walls was felt to be      due to pancreatitis or secondary to superior mesentery vein      insufficiency and/or partial occlusion.  Small amounts of free      fluid in the pelvis and sclerosis of the right femoral head were      again noted.  3. Two-dimensional echocardiogram.  This was performed on August 08, 2006, and revealed normal left ventricular systolic function with      an ejection fraction of 60%.  No left ventricular regional wall      motion abnormalities were seen.  Left ventricular wall thickness      was mildly to moderately increased with normal diastolic function      parameters.  There was also mild aortic root dilation.  4. CT-guided biopsy of the pancreatic mass.  This was attempted on      August 09, 2006, but was canceled secondary to overlying colon.      It was felt that a percutaneous approach was unsafe.  5. Endoscopic ultrasound and fine-needle aspiration of pancreatic      mass.  This was performed on August 12, 2006, by Dr. Rob Bunting.  A large, irregular heterogeneous mass in the head of the      pancreas measuring 5.6 x 4.5 cm was seen.  The mass was sampled      with three passes, and preliminary cytologic review of the fine      needle aspirates showed acute inflammation.  Reactive -appearing      peripancreatic adenopathy was also seen.  No celiac adenopathy was      noted.  The remainder of the pancreas was lobular and contained      hyperechoic strands and foci and hypoechoic foci.   CONSULTATIONS:  1. Dr. Richarda Overlie, Interventional Radiology.  2. Dr. Jeani Hawking, Gastroenterology.  3. Dr. Rob Bunting, Gastroenterology.   BRIEF HISTORY AND PHYSICAL:  Charles Krueger is a 55 year old man with a past  medical history significant only for hypertension and previous thoracic  lymphadenopathy seen on CT scan in early 2007, who presented to the  emergency room with a two-day history of worsening epigastric pressure  and bloating.  He states that this began insidiously but became  extremely painful on the morning of presentation.  He denies having any  nausea or vomiting, but had a poor appetite.  He has not experienced any  chest pain, shortness of breath, cough, or fevers; however, he has been  having frequent headaches for several weeks to months for which he has  been taking approximately six to eight 200-mg ibuprofen tablets daily.  He denies having any melenic or frank bloody stools.  Of note, he has a  long history of alcohol abuse, and continues to drink approximately 5  beers per day.   PHYSICAL EXAMINATION:  VITAL SIGNS:  Temperature 97.0, blood pressure  227/149, pulse 101, respirations 18, oxygen saturation 98% on  room air,  weight 161 pounds. GENERAL:   The patient is a well-developed, well-nourished man in mild  discomfort but no acute distress.  HEENT:  Pupils are equal, round, and reactive to light and  accommodation.  Extraocular eye movements are intact.  The oropharynx is  dry with a black discoloration of the posterior tongue.  The patient has  poor dentition.  NECK:  Supple without lymphadenopathy or thyromegaly.  RESPIRATORY:  The patient has good air movement but dry crackles are  heard in the bases bilaterally.  CARDIOVASCULAR:  The patient has a regular rate and rhythm without  murmurs, rubs, or gallops.  ABDOMEN:  No bowel sounds are appreciated.  The abdomen is slightly firm  with mild to moderate epigastric tenderness.  No rebound or involuntary  guarding is noted.  RECTAL:  Normal rectal tone with a small amount of tan stool.  Fecal  occult blood test is negative.  EXTREMITIES:  No lower extremity edema is present.  NEUROLOGIC:  Cranial Nerves II-XII are intact.   ADMISSION LABORATORY DATA:  White blood cell count 11.7, hemoglobin  15.5, hematocrit 44.4, platelets 322, ANC 8.9, MCV 99.3, RDW 13.9.  Sodium 136, potassium 3.0, chloride 101, bicarbonate 24, BUN 14,  creatinine 1.2, glucose 147, total bilirubin 0.9, alkaline phosphatase  142, AST 30, ALT 22, total protein 7.6, albumin 3.1, calcium 9.3,  magnesium 1.8, amylase 628, lipase 670.  Urine drug screen negative.  Urinalysis notable for glucose of 100, greater than 300 protein, 0 to 2  white blood cells, 0 to 2 red blood cells, hyaline and granular casts,  and many bacteria.   HOSPITAL COURSE BY PROBLEM:  1. Acute pancreatitis with inflammatory mass.  Based on the presenting      symptoms and the physical examination on admission, we were      concerned about acute pancreatitis.  This was supported by the      markedly elevated lipase and amylase levels.  However, the large      pancreatic mass seen on CT scan was highly suggestive of pancreatic      cancer,  which was supported by an elevated CA 19-9 level.  The      patient was admitted to the stepdown unit where he was made n.p.o.      and placed on a morphine PCA.  However, his pain improved      significantly, and he required virtually no analgesics.      Percutaneous biopsy of the pancreatic mass was attempted but  could      not be performed secondary to overlying bowel.  At that time, a      gastroenterology consultation was obtained with Dr. Elnoria Howard.  After      reviewing the CT films, he was also very concerned about pancreatic      carcinoma and felt that an endoscopic ultrasound with biopsy would      be the most appropriate next step.  Dr. Christella Hartigan was consulted and      performed an endoscopic ultrasound.  Examination of the fine needle      aspirate revealed inflammatory cells most consistent with an      abscess.  Final pathologic review showed abundant acute      inflammation and rare small aggregates of epithelial cells with     slight nuclear enlargement and nucleoli.  These features are      atypical, but they are in the setting of acute inflammation and are  quantitatively insufficient for definitive diagnosis.  There are      also benign squamous cells present most likely of oropharyngeal      origin.  At that time, the patient was restarted on a clear liquid      diet which he tolerated without difficulty.  This was advanced, and      he was able to tolerate a full diet without pain or nausea.  He      will follow up with Dr. Elnoria Howard in approximately one week.  Following      Dr. Larae Grooms recommendations, the patient will be continued on a ten-      day course of b.i.d. Cipro.  The patient was strongly encouraged to      refrain from drinking alcohol as this was the most likely cause of      his acute pancreatitis.  A followup CT scan will be performed in      one month to reevaluate the pancreatic mass.  2. Hypertensive urgency.  When the patient first arrived in the       emergency room, he was found to be quite hypertensive at 227/149.      However, he had no evidence of end-organ damage.  He was initially      started on a labetalol drip but was transitioned to IV metoprolol.      In order to prevent precipitation of cerebral or myocardial      ischemia, his blood pressure was lowered gradually.  He      intermittently required boluses of hydralazine to maintain a      systolic blood pressure between 160 and 180.  Once taking oral      medications, he was transitioned to oral metoprolol and lisinopril.      He tolerated these medications without difficulty, though he had a      small rise in his creatinine on the day prior to admission.  Though      this was not significant enough to  discontinue ACE inhibitor use,      it should be monitored closely.  The patient will return two days      after discharge to the outpatient clinic for a basic metabolic      panel to monitor his renal function and electrolytes.  If his      creatinine has continued to climb, the ACE inhibitor should be held      and an alternative medication should be started to manage his blood      pressure.  Most likely, he will need further titration of these      medications when he returns for followup in the clinic.  3. Proteinuria.  The patient was found to have proteinuria on urine      Dipstick.  A subsequent spot urine for protein and creatinine was      elevated.  A 24-hour urine collection was performed for protein and      was elevated at 980 mg.  As noted above, the patient was started on      lisinopril, which should benefit both is blood pressure and      proteinuria.  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  However,      if he develops any other evidence of kidney disease, a more      thorough workup should be performed.  4. Patchy airspace disease and history  of thoracic lymphadenopathy.     Because  of the patient's history of thoracic lymphadenopathy, a      chest CT was ordered at the time of admission.  This revealed      patchy airspace disease consistent with pneumonia.  However, the      patient was asymptomatic with no cough, shortness of breath, or      chest pain.  Additionally, he was found to have only a mildly      elevated white blood cell count which was attributed to his acute      pancreatitis.  He remained afebrile throughout the hospitalization      and was not started on antibiotic therapy until after his      endoscopic ultrasound.  No further treatment is warranted for his      lung findings as he remained asymptomatic.  5. Alcohol and tobacco abuse.  The patient has a history of heavy      alcohol consumption and tobacco use.  These factors most likely      contributed to his acute pancreatitis.  The patient was monitored      for signs and symptoms of withdrawal and received as needed Ativan      for anxiety and agitation during the early stages of his      hospitalization.  He was also started on thiamine and folic acid.      At the time of discharge, he did not exhibit any signs or symptoms      of alcohol withdrawal.  He was counseled to refrain from using both      tobacco and alcohol, and he was adamant that he would stop both of      these.  He was also asked to continue taking a daily multivitamin.   DISCHARGE LABORATORY DATA:  White blood cell count 10.2, hemoglobin  12.2, hematocrit 35.5, platelets 271; sodium 136, potassium 4.1,  chloride 106, bicarbonate 21, BUN 16, creatinine 1.4, glucose 81,  calcium 9.0.  CA-19-9 antigen 128.2.  Vitamin B12 339, RBC folate 333;  total cholesterol 137, triglycerides 86, HDL 36, LDL 84, hemoglobin A1c  5.4.   DISCHARGE VITAL SIGNS:  Temperature 98.7, blood pressure 168 to 192/98  to 108, pulse 108, respirations 20, oxygen saturation 98% on room air.      Yvonne Kendall, M.D.  Electronically Signed       Alvester Morin, M.D.  Electronically Signed    CE/MEDQ  D:  08/14/2006  T:  08/15/2006  Job:  [id number]   cc:   Jordan Hawks. Elnoria Howard, MD  Rachael Fee, MD

## 2012-11-07 ENCOUNTER — Encounter: Payer: Self-pay | Admitting: Gastroenterology

## 2014-06-03 ENCOUNTER — Emergency Department (HOSPITAL_COMMUNITY)
Admission: EM | Admit: 2014-06-03 | Discharge: 2014-06-03 | Disposition: A | Discharge status: Home / Self Care | Payer: Federal, State, Local not specified - PPO | Attending: Emergency Medicine | Admitting: Emergency Medicine

## 2014-06-03 ENCOUNTER — Emergency Department (HOSPITAL_COMMUNITY): Payer: Federal, State, Local not specified - PPO

## 2014-06-03 ENCOUNTER — Encounter (HOSPITAL_COMMUNITY): Payer: Self-pay | Admitting: Cardiology

## 2014-06-03 DIAGNOSIS — Z79899 Other long term (current) drug therapy: Secondary | ICD-10-CM | POA: Insufficient documentation

## 2014-06-03 DIAGNOSIS — R739 Hyperglycemia, unspecified: Secondary | ICD-10-CM

## 2014-06-03 DIAGNOSIS — R0981 Nasal congestion: Secondary | ICD-10-CM | POA: Diagnosis not present

## 2014-06-03 DIAGNOSIS — E1165 Type 2 diabetes mellitus with hyperglycemia: Secondary | ICD-10-CM | POA: Diagnosis not present

## 2014-06-03 DIAGNOSIS — Z720 Tobacco use: Secondary | ICD-10-CM | POA: Diagnosis not present

## 2014-06-03 DIAGNOSIS — Z7982 Long term (current) use of aspirin: Secondary | ICD-10-CM | POA: Insufficient documentation

## 2014-06-03 DIAGNOSIS — H539 Unspecified visual disturbance: Secondary | ICD-10-CM | POA: Insufficient documentation

## 2014-06-03 DIAGNOSIS — R05 Cough: Secondary | ICD-10-CM | POA: Insufficient documentation

## 2014-06-03 HISTORY — DX: Type 2 diabetes mellitus without complications: E11.9

## 2014-06-03 LAB — COMPREHENSIVE METABOLIC PANEL
ALBUMIN: 4 g/dL (ref 3.5–5.2)
ALT: 23 U/L (ref 0–53)
AST: 17 U/L (ref 0–37)
Alkaline Phosphatase: 185 U/L — ABNORMAL HIGH (ref 39–117)
Anion gap: 22 — ABNORMAL HIGH (ref 5–15)
BILIRUBIN TOTAL: 0.4 mg/dL (ref 0.3–1.2)
BUN: 33 mg/dL — ABNORMAL HIGH (ref 6–23)
CO2: 20 meq/L (ref 19–32)
Calcium: 11.4 mg/dL — ABNORMAL HIGH (ref 8.4–10.5)
Chloride: 87 mEq/L — ABNORMAL LOW (ref 96–112)
Creatinine, Ser: 1.32 mg/dL (ref 0.50–1.35)
GFR calc Af Amer: 68 mL/min — ABNORMAL LOW (ref >90)
GFR calc non Af Amer: 58 mL/min — ABNORMAL LOW (ref >90)
Glucose, Bld: 500 mg/dL — ABNORMAL HIGH (ref 70–99)
Potassium: 4.5 mEq/L (ref 3.7–5.3)
Sodium: 129 mEq/L — ABNORMAL LOW (ref 137–147)
Total Protein: 8.5 g/dL — ABNORMAL HIGH (ref 6.0–8.3)

## 2014-06-03 LAB — CBC
HEMATOCRIT: 47.4 % (ref 39.0–52.0)
Hemoglobin: 17.7 g/dL — ABNORMAL HIGH (ref 13.0–17.0)
MCH: 34.8 pg — ABNORMAL HIGH (ref 26.0–34.0)
MCHC: 37.3 g/dL — AB (ref 30.0–36.0)
MCV: 93.1 fL (ref 78.0–100.0)
Platelets: 142 10*3/uL — ABNORMAL LOW (ref 150–400)
RBC: 5.09 MIL/uL (ref 4.22–5.81)
RDW: 11.7 % (ref 11.5–15.5)
WBC: 6.7 10*3/uL (ref 4.0–10.5)

## 2014-06-03 LAB — URINALYSIS, ROUTINE W REFLEX MICROSCOPIC
BILIRUBIN URINE: NEGATIVE
Glucose, UA: >1000 mg/dL — AB
Hgb urine dipstick: NEGATIVE
Ketones, ur: 15 mg/dL — AB
Leukocytes, UA: NEGATIVE
Nitrite: NEGATIVE
PROTEIN: NEGATIVE mg/dL
Specific Gravity, Urine: 1.03 (ref 1.005–1.030)
UROBILINOGEN UA: 0.2 mg/dL (ref 0.0–1.0)
pH: 5.5 (ref 5.0–8.0)

## 2014-06-03 LAB — CBG MONITORING, ED
GLUCOSE-CAPILLARY: 150 mg/dL — AB (ref 70–99)
Glucose-Capillary: 482 mg/dL — ABNORMAL HIGH (ref 70–99)
Glucose-Capillary: 549 mg/dL — ABNORMAL HIGH (ref 70–99)
Glucose-Capillary: 326 mg/dL — ABNORMAL HIGH (ref 70–99)

## 2014-06-03 LAB — URINE MICROSCOPIC-ADD ON

## 2014-06-03 MED ORDER — SODIUM CHLORIDE 0.9 % IV BOLUS (SEPSIS)
1000.0000 mL | Freq: Once | INTRAVENOUS | Status: AC
  Administered 2014-06-03: 1000 mL via INTRAVENOUS

## 2014-06-03 MED ORDER — INSULIN ASPART 100 UNIT/ML ~~LOC~~ SOLN
5.0000 [IU] | Freq: Once | SUBCUTANEOUS | Status: AC
Start: 2014-06-03 — End: 2014-06-03
  Administered 2014-06-03: 5 [IU] via INTRAVENOUS
  Filled 2014-06-03: qty 1

## 2014-06-03 MED ORDER — INSULIN ASPART 100 UNIT/ML ~~LOC~~ SOLN
10.0000 [IU] | Freq: Once | SUBCUTANEOUS | Status: AC
  Administered 2014-06-03: 10 [IU] via SUBCUTANEOUS
  Filled 2014-06-03: qty 1

## 2014-06-03 NOTE — ED Notes (Signed)
Pt reports that he went to the [facility] today because his blood sugar was elevated. States that he checked it at home and it read high. States he was given a total of 20 units of insulin and his CBG was still in the 400s. Denies any symptoms at this time.

## 2014-06-03 NOTE — ED Provider Notes (Signed)
CSN: [id number]     Arrival date & time 06/03/14  1427 History   First MD Initiated Contact with Patient 06/03/14 1501     Chief Complaint  Patient presents with  . Hyperglycemia     (Consider location/radiation/quality/duration/timing/severity/associated sxs/prior Treatment) HPI Comments: Patient presents with high blood sugars. He does have a history of diabetes mellitus. He is on metformin 500 mg twice a day. He states over the last 2-3 days his blood sugars have been reading high. He went to the [facility] today and his blood sugars were over 500. He got a total of 20 units of insulin his blood sugar was still in the 400 range. He was sent over here for further evaluation. He's been having a little bit of blurry vision and increased urine output. He's also had increased thirst. Otherwise been feeling okay. He has a little bit of cough and nasal congestion. He denies any fevers. He denies any chest pain or shortness of breath. He's been under a lot of stress recently as his sister died on Thanksgiving and he's been traveling back and forth from [place]. He does say he's been eating a lot of fast food because of his travel.  Patient is a 58 y.o. male presenting with hyperglycemia.  Hyperglycemia Associated symptoms: no abdominal pain, no chest pain, no diaphoresis, no dizziness, no fatigue, no fever, no nausea, no shortness of breath and no vomiting     Past Medical History  Diagnosis Date  . Diabetes mellitus without complication    History reviewed. No pertinent past surgical history. History reviewed. No pertinent family history. History  Substance Use Topics  . Smoking status: Current Some Day Smoker  . Smokeless tobacco: Not on file  . Alcohol Use: Yes    Review of Systems  Constitutional: Negative for fever, chills, diaphoresis and fatigue.  HENT: Positive for congestion. Negative for rhinorrhea and sneezing.   Eyes: Positive for visual disturbance.  Respiratory: Positive  for cough. Negative for chest tightness and shortness of breath.   Cardiovascular: Negative for chest pain and leg swelling.  Gastrointestinal: Negative for nausea, vomiting, abdominal pain, diarrhea and blood in stool.  Genitourinary: Positive for frequency. Negative for hematuria, flank pain and difficulty urinating.  Musculoskeletal: Negative for back pain and arthralgias.  Skin: Negative for rash.  Neurological: Negative for dizziness, speech difficulty, weakness, numbness and headaches.      Allergies  Lisinopril  Home Medications   Prior to Admission medications   Medication Sig Start Date End Date Taking? Authorizing Provider  amLODipine (NORVASC) 10 MG tablet Take 10 mg by mouth daily.   Yes Historical Provider, MD  aspirin 81 MG tablet Take 81 mg by mouth daily.   Yes Historical Provider, MD  atorvastatin (LIPITOR) 40 MG tablet Take 20 mg by mouth daily.   Yes Historical Provider, MD  hydrochlorothiazide (HYDRODIURIL) 25 MG tablet Take 25 mg by mouth daily.   Yes Historical Provider, MD  losartan (COZAAR) 100 MG tablet Take 100 mg by mouth daily.   Yes Historical Provider, MD  metFORMIN (GLUCOPHAGE) 500 MG tablet Take 500 mg by mouth 2 (two) times daily with a meal.   Yes Historical Provider, MD  metoprolol (LOPRESSOR) 50 MG tablet Take 50 mg by mouth 2 (two) times daily.   Yes Historical Provider, MD  Multiple Vitamin (MULTIVITAMIN WITH MINERALS) TABS tablet Take 1 tablet by mouth daily.   Yes Historical Provider, MD  OVER THE COUNTER MEDICATION Take 1 tablet by mouth daily.  Ultra Mega 3   Yes Historical Provider, MD   BP 122/74 mmHg  Pulse 88  Temp(Src) 99.1 F (37.3 C) (Oral)  Resp 22  Ht 5\' 9"  (1.753 m)  Wt 195 lb (88.451 kg)  BMI 28.78 kg/m2  SpO2 93% Physical Exam  Constitutional: He is oriented to person, place, and time. He appears well-developed and well-nourished.  HENT:  Head: Normocephalic and atraumatic.  Eyes: Pupils are equal, round, and reactive to  light.  Neck: Normal range of motion. Neck supple.  Cardiovascular: Normal rate, regular rhythm and normal heart sounds.   Pulmonary/Chest: Effort normal and breath sounds normal. No respiratory distress. He has no wheezes. He has no rales. He exhibits no tenderness.  Abdominal: Soft. Bowel sounds are normal. There is no tenderness. There is no rebound and no guarding.  Musculoskeletal: Normal range of motion. He exhibits no edema.  Lymphadenopathy:    He has no cervical adenopathy.  Neurological: He is alert and oriented to person, place, and time.  Skin: Skin is warm and dry. No rash noted.  Psychiatric: He has a normal mood and affect.    ED Course  Procedures (including critical care time) Labs Review Results for orders placed or performed during the hospital encounter of 06/03/14  Comprehensive metabolic panel  Result Value Ref Range   Sodium 129 (L) 137 - 147 mEq/L   Potassium 4.5 3.7 - 5.3 mEq/L   Chloride 87 (L) 96 - 112 mEq/L   CO2 20 19 - 32 mEq/L   Glucose, Bld 500 (H) 70 - 99 mg/dL   BUN 33 (H) 6 - 23 mg/dL   Creatinine, Ser 9.601.32 0.50 - 1.35 mg/dL   Calcium 45.411.4 (H) 8.4 - 10.5 mg/dL   Total Protein 8.5 (H) 6.0 - 8.3 g/dL   Albumin 4.0 3.5 - 5.2 g/dL   AST 17 0 - 37 U/L   ALT 23 0 - 53 U/L   Alkaline Phosphatase 185 (H) 39 - 117 U/L   Total Bilirubin 0.4 0.3 - 1.2 mg/dL   GFR calc non Af Amer 58 (L) >90 mL/min   GFR calc Af Amer 68 (L) >90 mL/min   Anion gap 22 (H) 5 - 15  CBC  Result Value Ref Range   WBC 6.7 4.0 - 10.5 K/uL   RBC 5.09 4.22 - 5.81 MIL/uL   Hemoglobin 17.7 (H) 13.0 - 17.0 g/dL   HCT 09.847.4 11.939.0 - 14.752.0 %   MCV 93.1 78.0 - 100.0 fL   MCH 34.8 (H) 26.0 - 34.0 pg   MCHC 37.3 (H) 30.0 - 36.0 g/dL   RDW 82.911.7 56.211.5 - 13.015.5 %   Platelets 142 (L) 150 - 400 K/uL  Urinalysis, Routine w reflex microscopic  Result Value Ref Range   Color, Urine YELLOW YELLOW   APPearance CLEAR CLEAR   Specific Gravity, Urine 1.030 1.005 - 1.030   pH 5.5 5.0 - 8.0    Glucose, UA >1000 (A) NEGATIVE mg/dL   Hgb urine dipstick NEGATIVE NEGATIVE   Bilirubin Urine NEGATIVE NEGATIVE   Ketones, ur 15 (A) NEGATIVE mg/dL   Protein, ur NEGATIVE NEGATIVE mg/dL   Urobilinogen, UA 0.2 0.0 - 1.0 mg/dL   Nitrite NEGATIVE NEGATIVE   Leukocytes, UA NEGATIVE NEGATIVE  Urine microscopic-add on  Result Value Ref Range   WBC, UA 0-2 <3 WBC/hpf   Bacteria, UA RARE RARE  CBG monitoring, ED  Result Value Ref Range   Glucose-Capillary 482 (H) 70 - 99 mg/dL  Dg Chest 2 View  06/03/2014   CLINICAL DATA:  58 year old male with hyperglycemia, cough. Initial encounter.  EXAM: CHEST  2 VIEW  COMPARISON:  02/14/2009 and earlier.  FINDINGS: Mildly lower lung volumes. Cardiomegaly appears regressed since 2010. Other mediastinal contours are within normal limits. Visualized tracheal air column is within normal limits. Chronic linear scarring or atelectasis at the left lung base, lingula. No acute pulmonary opacity, pneumothorax, pleural effusion or pulmonary edema. No acute osseous abnormality identified.  IMPRESSION: No acute cardiopulmonary abnormality. Chronic lingula scarring or atelectasis.   Electronically Signed   By: Augusto GambleLee  Hall M.D.   On: 06/03/2014 16:29      Imaging Review Dg Chest 2 View  06/03/2014   CLINICAL DATA:  58 year old male with hyperglycemia, cough. Initial encounter.  EXAM: CHEST  2 VIEW  COMPARISON:  02/14/2009 and earlier.  FINDINGS: Mildly lower lung volumes. Cardiomegaly appears regressed since 2010. Other mediastinal contours are within normal limits. Visualized tracheal air column is within normal limits. Chronic linear scarring or atelectasis at the left lung base, lingula. No acute pulmonary opacity, pneumothorax, pleural effusion or pulmonary edema. No acute osseous abnormality identified.  IMPRESSION: No acute cardiopulmonary abnormality. Chronic lingula scarring or atelectasis.   Electronically Signed   By: Augusto GambleLee  Hall M.D.   On: 06/03/2014 16:29      EKG Interpretation   Date/Time:  Thursday June 03 2014 15:40:46 EST Ventricular Rate:  95 PR Interval:  157 QRS Duration: 108 QT Interval:  335 QTC Calculation: 421 R Axis:   -31 Text Interpretation:  Sinus rhythm Inferior infarct, old Borderline ST  elevation, anterior leads Baseline wander in lead(s) V6 similar to prior  EKG Confirmed by Jourdin Gens  MD, Moet Mikulski ([id number]) on 06/03/2014 4:00:34 PM      MDM   Final diagnoses:  None    Pt given IVFs, insulin.  Will recheck sugar.  Dr. Micheline Mazeocherty to follow and dispo after fluids.    Rolan BuccoMelanie Etana Beets, MD 06/03/14 [phone]

## 2014-06-03 NOTE — Discharge Instructions (Signed)

## 2014-06-07 ENCOUNTER — Emergency Department (HOSPITAL_COMMUNITY)
Admission: EM | Admit: 2014-06-07 | Discharge: 2014-06-08 | Disposition: A | Discharge status: Home / Self Care | Payer: Federal, State, Local not specified - PPO | Attending: Emergency Medicine | Admitting: Emergency Medicine

## 2014-06-07 ENCOUNTER — Encounter (HOSPITAL_COMMUNITY): Payer: Self-pay | Admitting: Physical Medicine and Rehabilitation

## 2014-06-07 DIAGNOSIS — Z7982 Long term (current) use of aspirin: Secondary | ICD-10-CM | POA: Diagnosis not present

## 2014-06-07 DIAGNOSIS — R739 Hyperglycemia, unspecified: Secondary | ICD-10-CM

## 2014-06-07 DIAGNOSIS — E1165 Type 2 diabetes mellitus with hyperglycemia: Secondary | ICD-10-CM | POA: Insufficient documentation

## 2014-06-07 DIAGNOSIS — Z79899 Other long term (current) drug therapy: Secondary | ICD-10-CM | POA: Diagnosis not present

## 2014-06-07 DIAGNOSIS — Z720 Tobacco use: Secondary | ICD-10-CM | POA: Diagnosis not present

## 2014-06-07 DIAGNOSIS — Z794 Long term (current) use of insulin: Secondary | ICD-10-CM | POA: Insufficient documentation

## 2014-06-07 DIAGNOSIS — R631 Polydipsia: Secondary | ICD-10-CM | POA: Insufficient documentation

## 2014-06-07 LAB — URINE MICROSCOPIC-ADD ON

## 2014-06-07 LAB — COMPREHENSIVE METABOLIC PANEL
ALT: 26 U/L (ref 0–53)
ANION GAP: 15 (ref 5–15)
AST: 18 U/L (ref 0–37)
Albumin: 4 g/dL (ref 3.5–5.2)
Alkaline Phosphatase: 161 U/L — ABNORMAL HIGH (ref 39–117)
BUN: 32 mg/dL — AB (ref 6–23)
CO2: 23 mEq/L (ref 19–32)
Calcium: 11.2 mg/dL — ABNORMAL HIGH (ref 8.4–10.5)
Chloride: 91 mEq/L — ABNORMAL LOW (ref 96–112)
Creatinine, Ser: 1.51 mg/dL — ABNORMAL HIGH (ref 0.50–1.35)
GFR calc Af Amer: 57 mL/min — ABNORMAL LOW (ref >90)
GFR calc non Af Amer: 50 mL/min — ABNORMAL LOW (ref >90)
Glucose, Bld: 467 mg/dL — ABNORMAL HIGH (ref 70–99)
Potassium: 4.5 mEq/L (ref 3.7–5.3)
Sodium: 129 mEq/L — ABNORMAL LOW (ref 137–147)
Total Bilirubin: 0.5 mg/dL (ref 0.3–1.2)
Total Protein: 7.9 g/dL (ref 6.0–8.3)

## 2014-06-07 LAB — CBC WITH DIFFERENTIAL/PLATELET
BASOS PCT: 0 % (ref 0–1)
Basophils Absolute: 0 10*3/uL (ref 0.0–0.1)
Eosinophils Absolute: 0.1 10*3/uL (ref 0.0–0.7)
Eosinophils Relative: 2 % (ref 0–5)
HCT: 46.5 % (ref 39.0–52.0)
Hemoglobin: 17 g/dL (ref 13.0–17.0)
Lymphocytes Relative: 42 % (ref 12–46)
Lymphs Abs: 3.4 10*3/uL (ref 0.7–4.0)
MCH: 34.1 pg — ABNORMAL HIGH (ref 26.0–34.0)
MCHC: 36.6 g/dL — AB (ref 30.0–36.0)
MCV: 93.4 fL (ref 78.0–100.0)
MONOS PCT: 7 % (ref 3–12)
Monocytes Absolute: 0.6 10*3/uL (ref 0.1–1.0)
NEUTROS ABS: 4 10*3/uL (ref 1.7–7.7)
Neutrophils Relative %: 49 % (ref 43–77)
Platelets: 175 10*3/uL (ref 150–400)
RBC: 4.98 MIL/uL (ref 4.22–5.81)
RDW: 11.7 % (ref 11.5–15.5)
WBC: 8.1 10*3/uL (ref 4.0–10.5)

## 2014-06-07 LAB — CBG MONITORING, ED
GLUCOSE-CAPILLARY: 333 mg/dL — AB (ref 70–99)
Glucose-Capillary: 512 mg/dL — ABNORMAL HIGH (ref 70–99)

## 2014-06-07 LAB — URINALYSIS, ROUTINE W REFLEX MICROSCOPIC
Bilirubin Urine: NEGATIVE
HGB URINE DIPSTICK: NEGATIVE
KETONES UR: NEGATIVE mg/dL
Leukocytes, UA: NEGATIVE
Nitrite: NEGATIVE
PH: 5.5 (ref 5.0–8.0)
Protein, ur: NEGATIVE mg/dL
Specific Gravity, Urine: 1.024 (ref 1.005–1.030)
Urobilinogen, UA: 0.2 mg/dL (ref 0.0–1.0)

## 2014-06-07 MED ORDER — INSULIN ASPART 100 UNIT/ML ~~LOC~~ SOLN
20.0000 [IU] | Freq: Once | SUBCUTANEOUS | Status: AC
  Administered 2014-06-07: 20 [IU] via SUBCUTANEOUS
  Filled 2014-06-07: qty 1

## 2014-06-07 MED ORDER — INSULIN ASPART 100 UNIT/ML ~~LOC~~ SOLN: 10.0000 [IU] | Freq: Once | SUBCUTANEOUS | Status: DC

## 2014-06-07 MED ORDER — SODIUM CHLORIDE 0.9 % IV BOLUS (SEPSIS)
2000.0000 mL | Freq: Once | INTRAVENOUS | Status: AC
  Administered 2014-06-07: 2000 mL via INTRAVENOUS

## 2014-06-07 NOTE — ED Notes (Signed)
Pt presents to department for evaluation of hyperglycemia. Ongoing x1 week. Also reports blurred vision, increased thirst and headache. Reports CRITICAL HIGH blood sugar at home today. Pt is alert and oriented x4.

## 2014-06-07 NOTE — ED Notes (Signed)
CBG 512 in triage

## 2014-06-08 MED ORDER — INSULIN ASPART PROT & ASPART (70-30 MIX) 100 UNIT/ML ~~LOC~~ SUSP
15.0000 [IU] | Freq: Once | SUBCUTANEOUS | Status: AC
  Administered 2014-06-08: 15 [IU] via SUBCUTANEOUS
  Filled 2014-06-08: qty 10

## 2014-06-08 NOTE — Discharge Instructions (Signed)
Increase your nighttime insulin dose to 15 units, and her morning dose to 25 units. Call your doctor as soon as possible to schedule a follow-up appointment. If you're blood sugar is still high, you can increase your morning and evening insulin doses to 18 and 28 units.  Be sure to check her blood sugar regularly.   Hyperglycemia Hyperglycemia occurs when the glucose (sugar) in your blood is too high. Hyperglycemia can happen for many reasons, but it most often happens to people who do not know they have diabetes or are not managing their diabetes properly.  CAUSES  Whether you have diabetes or not, there are other causes of hyperglycemia. Hyperglycemia can occur when you have diabetes, but it can also occur in other situations that you might not be as aware of, such as: Diabetes  If you have diabetes and are having problems controlling your blood glucose, hyperglycemia could occur because of some of the following reasons:  Not following your meal plan.  Not taking your diabetes medications or not taking it properly.  Exercising less or doing less activity than you normally do.  Being sick. Pre-diabetes  This cannot be ignored. Before people develop Type 2 diabetes, they almost always have "pre-diabetes." This is when your blood glucose levels are higher than normal, but not yet high enough to be diagnosed as diabetes. Research has shown that some long-term damage to the body, especially the heart and circulatory system, may already be occurring during pre-diabetes. If you take action to manage your blood glucose when you have pre-diabetes, you may delay or prevent Type 2 diabetes from developing. Stress  If you have diabetes, you may be "diet" controlled or on oral medications or insulin to control your diabetes. However, you may find that your blood glucose is higher than usual in the hospital whether you have diabetes or not. This is often referred to as "stress hyperglycemia." Stress can  elevate your blood glucose. This happens because of hormones put out by the body during times of stress. If stress has been the cause of your high blood glucose, it can be followed regularly by your caregiver. That way he/she can make sure your hyperglycemia does not continue to get worse or progress to diabetes. Steroids  Steroids are medications that act on the infection fighting system (immune system) to block inflammation or infection. One side effect can be a rise in blood glucose. Most people can produce enough extra insulin to allow for this rise, but for those who cannot, steroids make blood glucose levels go even higher. It is not unusual for steroid treatments to "uncover" diabetes that is developing. It is not always possible to determine if the hyperglycemia will go away after the steroids are stopped. A special blood test called an A1c is sometimes done to determine if your blood glucose was elevated before the steroids were started. SYMPTOMS  Thirsty.  Frequent urination.  Dry mouth.  Blurred vision.  Tired or fatigue.  Weakness.  Sleepy.  Tingling in feet or leg. DIAGNOSIS  Diagnosis is made by monitoring blood glucose in one or all of the following ways:  A1c test. This is a chemical found in your blood.  Fingerstick blood glucose monitoring.  Laboratory results. TREATMENT  First, knowing the cause of the hyperglycemia is important before the hyperglycemia can be treated. Treatment may include, but is not be limited to:  Education.  Change or adjustment in medications.  Change or adjustment in meal plan.  Treatment for an  illness, infection, etc.  More frequent blood glucose monitoring.  Change in exercise plan.  Decreasing or stopping steroids.  Lifestyle changes. HOME CARE INSTRUCTIONS   Test your blood glucose as directed.  Exercise regularly. Your caregiver will give you instructions about exercise. Pre-diabetes or diabetes which comes on with  stress is helped by exercising.  Eat wholesome, balanced meals. Eat often and at regular, fixed times. Your caregiver or nutritionist will give you a meal plan to guide your sugar intake.  Being at an ideal weight is important. If needed, losing as little as 10 to 15 pounds may help improve blood glucose levels. SEEK MEDICAL CARE IF:   You have questions about medicine, activity, or diet.  You continue to have symptoms (problems such as increased thirst, urination, or weight gain). SEEK IMMEDIATE MEDICAL CARE IF:   You are vomiting or have diarrhea.  Your breath smells fruity.  You are breathing faster or slower.  You are very sleepy or incoherent.  You have numbness, tingling, or pain in your feet or hands.  You have chest pain.  Your symptoms get worse even though you have been following your caregiver's orders.  If you have any other questions or concerns. Document Released: 12/05/2000 Document Revised: 09/03/2011 Document Reviewed: 10/08/2011 [facility] Patient Information 2015 [place], [place]. This information is not intended to replace advice given to you by your health care provider. Make sure you discuss any questions you have with your health care provider.

## 2014-06-08 NOTE — ED Notes (Signed)
Discharge instructions given, voiced understanding.  Patient will follow up with his MD in 2 days.  Will call in the Am

## 2014-06-08 NOTE — ED Provider Notes (Signed)
CSN: [id number]     Arrival date & time 06/07/14  1715 History   First MD Initiated Contact with Patient 06/07/14 1903     Chief Complaint  Patient presents with  . Hyperglycemia     (Consider location/radiation/quality/duration/timing/severity/associated sxs/prior Treatment) HPI 58 year old male with a history of diabetes presents with hyperglycemia. He was here last week with elevated blood sugar, was treated, and sent home to follow up with his primary care doctor who saw him on Friday. He was started on insulin, NovoLog 70/30 20 units in the morning and 10 units at night. He also had his metformin increased. He says that over the weekend his blood sugars been persistently high, with all values greater than 300. This evening his sugar was reading high, too high to calculate on his home machine. He complains of some blurry vision, extreme thirst and polyuria.   Past Medical History  Diagnosis Date  . Diabetes mellitus without complication    History reviewed. No pertinent past surgical history. History reviewed. No pertinent family history. History  Substance Use Topics  . Smoking status: Current Some Day Smoker    Types: Cigarettes  . Smokeless tobacco: Not on file  . Alcohol Use: Yes    Review of Systems  Eyes: Positive for visual disturbance.  Endocrine: Positive for polydipsia and polyuria.  Musculoskeletal: Negative for back pain and gait problem.  All other systems reviewed and are negative.     Allergies  Lisinopril  Home Medications   Prior to Admission medications   Medication Sig Start Date End Date Taking? Authorizing Provider  amLODipine (NORVASC) 10 MG tablet Take 10 mg by mouth daily.   Yes Historical Provider, MD  aspirin 81 MG tablet Take 81 mg by mouth daily.   Yes Historical Provider, MD  atorvastatin (LIPITOR) 40 MG tablet Take 20 mg by mouth daily.   Yes Historical Provider, MD  hydrochlorothiazide (HYDRODIURIL) 25 MG tablet Take 25 mg by mouth  daily.   Yes Historical Provider, MD  insulin aspart (NOVOLOG) 100 UNIT/ML injection Inject 10-20 Units into the skin See admin instructions. 20units in the morning and 10 units in the afternoon   Yes Historical Provider, MD  losartan (COZAAR) 100 MG tablet Take 100 mg by mouth daily.   Yes Historical Provider, MD  metFORMIN (GLUCOPHAGE) 1000 MG tablet Take 1,000 mg by mouth 2 (two) times daily with a meal.   Yes Historical Provider, MD  metFORMIN (GLUCOPHAGE) 500 MG tablet Take 500 mg by mouth 2 (two) times daily with a meal.   Yes Historical Provider, MD  metoprolol (LOPRESSOR) 50 MG tablet Take 50 mg by mouth 2 (two) times daily.   Yes Historical Provider, MD  Multiple Vitamin (MULTIVITAMIN WITH MINERALS) TABS tablet Take 1 tablet by mouth daily.   Yes Historical Provider, MD  OVER THE COUNTER MEDICATION Take 1 tablet by mouth daily. Ultra Mega 3   Yes Historical Provider, MD   BP 116/70 mmHg  Pulse 65  Temp(Src) 97.9 F (36.6 C) (Oral)  Resp 16  Ht 5\' 9"  (1.753 m)  Wt 195 lb (88.451 kg)  BMI 28.78 kg/m2  SpO2 92% Physical Exam  Constitutional: He is oriented to person, place, and time. He appears well-developed and well-nourished. No distress.  HENT:  Head: Normocephalic and atraumatic.  Eyes: Conjunctivae are normal. Pupils are equal, round, and reactive to light.  Neck: Normal range of motion. Neck supple. No thyromegaly present.  Cardiovascular: Normal rate, regular rhythm and normal heart sounds.  Pulmonary/Chest: Effort normal and breath sounds normal. No respiratory distress.  Abdominal: Soft. Bowel sounds are normal. He exhibits no distension.  Musculoskeletal: Normal range of motion. He exhibits no edema or tenderness.  Neurological: He is alert and oriented to person, place, and time. No cranial nerve deficit.  Skin: Skin is warm and dry.  Psychiatric: He has a normal mood and affect.  Nursing note and vitals reviewed.   ED Course  Procedures (including critical care  time) Labs Review Labs Reviewed  CBC WITH DIFFERENTIAL - Abnormal; Notable for the following:    MCH 34.1 (*)    MCHC 36.6 (*)    All other components within normal limits  COMPREHENSIVE METABOLIC PANEL - Abnormal; Notable for the following:    Sodium 129 (*)    Chloride 91 (*)    Glucose, Bld 467 (*)    BUN 32 (*)    Creatinine, Ser 1.51 (*)    Calcium 11.2 (*)    Alkaline Phosphatase 161 (*)    GFR calc non Af Amer 50 (*)    GFR calc Af Amer 57 (*)    All other components within normal limits  URINALYSIS, ROUTINE W REFLEX MICROSCOPIC - Abnormal; Notable for the following:    Glucose, UA >1000 (*)    All other components within normal limits  CBG MONITORING, ED - Abnormal; Notable for the following:    Glucose-Capillary 512 (*)    All other components within normal limits  CBG MONITORING, ED - Abnormal; Notable for the following:    Glucose-Capillary 333 (*)    All other components within normal limits  URINE MICROSCOPIC-ADD ON    Imaging Review No results found.   EKG Interpretation None      MDM   Final diagnoses:  Hyperglycemia   58 year old male with diabetes. He presents with hyperglycemia, but no evidence of ketoacidosis or hyperosmolar state. Patient was hydrated with 2 L of fluid, and given 20 units of insulin which reduced his blood sugar significantly. Since he has had 3 days of all blood sugars over 200, he will need to increase his basal insulin dose. Will start conservatively by increasing his nighttime dose to 15 units, and his morning dose to 25 units. He will check his blood sugar regularly, and will be able to follow-up soon with his primary care doctor. No infectious etiology seen to be driving this.     Erskine Emeryhris Ishaan Villamar, MD 06/08/14 [id number]  Toy CookeyMegan Docherty, MD 06/08/14 [phone]

## 2019-12-22 ENCOUNTER — Telehealth: Payer: Self-pay | Admitting: Gastroenterology

## 2019-12-22 NOTE — Telephone Encounter (Signed)
Called patient to advise and schedule left voicemail. 

## 2019-12-22 NOTE — Telephone Encounter (Signed)
I reviewed a 61 page packet of information from the [place]. there was very little clinical information within the packet.  It looks like they are requesting a recall colonoscopy.  I was able to find a colonoscopy report from July 2018.  Colonoscopy at the [facility] July 2018: Indication?.  Findings hepatic flexure 1.6 cm sessile polyp cold snare resected (no mention in the report whether this was done in piecemeal fashion).  Sigmoid colon pedunculated polyp 1.8 cm hot snare resected.  Diverticulosis in both the left and right colon.  Pathology shows that both polyps were tubular adenomas.   He needs repeat colonoscopy at his soonest convenience.  [facility] seems appropriate.  Thank you

## 2019-12-22 NOTE — Telephone Encounter (Signed)
Hi Dr. Christella Hartigan,   We received a referral from [facility] for a repeat colonoscopy. Requested previous records from 2018 colonoscopy. Will be sending these to you for review. Please advise on scheduling.   Thank you

## 2020-02-02 NOTE — Telephone Encounter (Signed)
Called patient to schedule left voicemail. 

## 2020-10-13 DIAGNOSIS — N1832 Chronic kidney disease, stage 3b: Secondary | ICD-10-CM | POA: Diagnosis present

## 2020-12-08 ENCOUNTER — Emergency Department (HOSPITAL_COMMUNITY): Payer: No Typology Code available for payment source

## 2020-12-08 ENCOUNTER — Encounter (HOSPITAL_COMMUNITY): Payer: Self-pay | Admitting: *Deleted

## 2020-12-08 ENCOUNTER — Inpatient Hospital Stay (HOSPITAL_COMMUNITY): Payer: No Typology Code available for payment source

## 2020-12-08 ENCOUNTER — Other Ambulatory Visit: Payer: Self-pay

## 2020-12-08 ENCOUNTER — Inpatient Hospital Stay (HOSPITAL_COMMUNITY)
Admission: EM | Admit: 2020-12-08 | Discharge: 2020-12-17 | DRG: 286 | Disposition: A | Discharge status: Home / Self Care | Payer: No Typology Code available for payment source | Attending: Student in an Organized Health Care Education/Training Program | Admitting: Student in an Organized Health Care Education/Training Program

## 2020-12-08 DIAGNOSIS — Z7984 Long term (current) use of oral hypoglycemic drugs: Secondary | ICD-10-CM

## 2020-12-08 DIAGNOSIS — I2721 Secondary pulmonary arterial hypertension: Secondary | ICD-10-CM | POA: Diagnosis present

## 2020-12-08 DIAGNOSIS — Z794 Long term (current) use of insulin: Secondary | ICD-10-CM

## 2020-12-08 DIAGNOSIS — F17290 Nicotine dependence, other tobacco product, uncomplicated: Secondary | ICD-10-CM | POA: Diagnosis present

## 2020-12-08 DIAGNOSIS — E669 Obesity, unspecified: Secondary | ICD-10-CM | POA: Diagnosis present

## 2020-12-08 DIAGNOSIS — I5043 Acute on chronic combined systolic (congestive) and diastolic (congestive) heart failure: Secondary | ICD-10-CM | POA: Diagnosis present

## 2020-12-08 DIAGNOSIS — I420 Dilated cardiomyopathy: Secondary | ICD-10-CM | POA: Diagnosis present

## 2020-12-08 DIAGNOSIS — N189 Chronic kidney disease, unspecified: Secondary | ICD-10-CM

## 2020-12-08 DIAGNOSIS — J449 Chronic obstructive pulmonary disease, unspecified: Secondary | ICD-10-CM | POA: Insufficient documentation

## 2020-12-08 DIAGNOSIS — E213 Hyperparathyroidism, unspecified: Secondary | ICD-10-CM | POA: Diagnosis present

## 2020-12-08 DIAGNOSIS — K7460 Unspecified cirrhosis of liver: Secondary | ICD-10-CM | POA: Diagnosis present

## 2020-12-08 DIAGNOSIS — I255 Ischemic cardiomyopathy: Secondary | ICD-10-CM | POA: Diagnosis present

## 2020-12-08 DIAGNOSIS — I083 Combined rheumatic disorders of mitral, aortic and tricuspid valves: Secondary | ICD-10-CM | POA: Diagnosis present

## 2020-12-08 DIAGNOSIS — J9601 Acute respiratory failure with hypoxia: Secondary | ICD-10-CM

## 2020-12-08 DIAGNOSIS — E1122 Type 2 diabetes mellitus with diabetic chronic kidney disease: Secondary | ICD-10-CM | POA: Diagnosis present

## 2020-12-08 DIAGNOSIS — I2729 Other secondary pulmonary hypertension: Secondary | ICD-10-CM | POA: Diagnosis not present

## 2020-12-08 DIAGNOSIS — D860 Sarcoidosis of lung: Secondary | ICD-10-CM | POA: Diagnosis present

## 2020-12-08 DIAGNOSIS — I2510 Atherosclerotic heart disease of native coronary artery without angina pectoris: Secondary | ICD-10-CM | POA: Diagnosis present

## 2020-12-08 DIAGNOSIS — N1832 Chronic kidney disease, stage 3b: Secondary | ICD-10-CM | POA: Diagnosis present

## 2020-12-08 DIAGNOSIS — I5041 Acute combined systolic (congestive) and diastolic (congestive) heart failure: Secondary | ICD-10-CM | POA: Diagnosis not present

## 2020-12-08 DIAGNOSIS — R0602 Shortness of breath: Secondary | ICD-10-CM | POA: Diagnosis present

## 2020-12-08 DIAGNOSIS — Z833 Family history of diabetes mellitus: Secondary | ICD-10-CM

## 2020-12-08 DIAGNOSIS — I5033 Acute on chronic diastolic (congestive) heart failure: Secondary | ICD-10-CM | POA: Diagnosis not present

## 2020-12-08 DIAGNOSIS — E785 Hyperlipidemia, unspecified: Secondary | ICD-10-CM | POA: Diagnosis present

## 2020-12-08 DIAGNOSIS — R188 Other ascites: Secondary | ICD-10-CM | POA: Diagnosis present

## 2020-12-08 DIAGNOSIS — Z79899 Other long term (current) drug therapy: Secondary | ICD-10-CM

## 2020-12-08 DIAGNOSIS — N1831 Chronic kidney disease, stage 3a: Secondary | ICD-10-CM | POA: Diagnosis not present

## 2020-12-08 DIAGNOSIS — M109 Gout, unspecified: Secondary | ICD-10-CM | POA: Diagnosis present

## 2020-12-08 DIAGNOSIS — Z7982 Long term (current) use of aspirin: Secondary | ICD-10-CM | POA: Diagnosis not present

## 2020-12-08 DIAGNOSIS — I130 Hypertensive heart and chronic kidney disease with heart failure and stage 1 through stage 4 chronic kidney disease, or unspecified chronic kidney disease: Principal | ICD-10-CM | POA: Diagnosis present

## 2020-12-08 DIAGNOSIS — D509 Iron deficiency anemia, unspecified: Secondary | ICD-10-CM | POA: Diagnosis present

## 2020-12-08 DIAGNOSIS — I25119 Atherosclerotic heart disease of native coronary artery with unspecified angina pectoris: Secondary | ICD-10-CM | POA: Diagnosis not present

## 2020-12-08 DIAGNOSIS — G4733 Obstructive sleep apnea (adult) (pediatric): Secondary | ICD-10-CM | POA: Diagnosis present

## 2020-12-08 DIAGNOSIS — F1010 Alcohol abuse, uncomplicated: Secondary | ICD-10-CM | POA: Diagnosis present

## 2020-12-08 DIAGNOSIS — I5023 Acute on chronic systolic (congestive) heart failure: Secondary | ICD-10-CM | POA: Diagnosis not present

## 2020-12-08 DIAGNOSIS — Z8249 Family history of ischemic heart disease and other diseases of the circulatory system: Secondary | ICD-10-CM

## 2020-12-08 DIAGNOSIS — Z20822 Contact with and (suspected) exposure to covid-19: Secondary | ICD-10-CM | POA: Diagnosis present

## 2020-12-08 DIAGNOSIS — Z6832 Body mass index (BMI) 32.0-32.9, adult: Secondary | ICD-10-CM

## 2020-12-08 DIAGNOSIS — N049 Nephrotic syndrome with unspecified morphologic changes: Secondary | ICD-10-CM | POA: Diagnosis present

## 2020-12-08 DIAGNOSIS — E119 Type 2 diabetes mellitus without complications: Secondary | ICD-10-CM

## 2020-12-08 DIAGNOSIS — R590 Localized enlarged lymph nodes: Secondary | ICD-10-CM | POA: Insufficient documentation

## 2020-12-08 DIAGNOSIS — Z888 Allergy status to other drugs, medicaments and biological substances status: Secondary | ICD-10-CM

## 2020-12-08 DIAGNOSIS — I509 Heart failure, unspecified: Secondary | ICD-10-CM

## 2020-12-08 DIAGNOSIS — D649 Anemia, unspecified: Secondary | ICD-10-CM | POA: Insufficient documentation

## 2020-12-08 HISTORY — DX: Obstructive sleep apnea (adult) (pediatric): G47.33

## 2020-12-08 HISTORY — DX: Essential (primary) hypertension: I10

## 2020-12-08 LAB — CBC WITH DIFFERENTIAL/PLATELET
Abs Immature Granulocytes: 0 10*3/uL (ref 0.00–0.07)
Basophils Absolute: 0.3 10*3/uL — ABNORMAL HIGH (ref 0.0–0.1)
Basophils Relative: 4 %
Eosinophils Absolute: 0.8 10*3/uL — ABNORMAL HIGH (ref 0.0–0.5)
Eosinophils Relative: 11 %
HCT: 36 % — ABNORMAL LOW (ref 39.0–52.0)
Hemoglobin: 10 g/dL — ABNORMAL LOW (ref 13.0–17.0)
Lymphocytes Relative: 5 %
Lymphs Abs: 0.4 10*3/uL — ABNORMAL LOW (ref 0.7–4.0)
MCH: 23.1 pg — ABNORMAL LOW (ref 26.0–34.0)
MCHC: 27.8 g/dL — ABNORMAL LOW (ref 30.0–36.0)
MCV: 83.3 fL (ref 80.0–100.0)
Monocytes Absolute: 0.5 10*3/uL (ref 0.1–1.0)
Monocytes Relative: 7 %
Neutro Abs: 5.2 10*3/uL (ref 1.7–7.7)
Neutrophils Relative %: 73 %
Platelets: 276 10*3/uL (ref 150–400)
RBC: 4.32 MIL/uL (ref 4.22–5.81)
RDW: 17.9 % — ABNORMAL HIGH (ref 11.5–15.5)
WBC: 7.1 10*3/uL (ref 4.0–10.5)
nRBC: 0 % (ref 0.0–0.2)
nRBC: 1 /100 WBC — ABNORMAL HIGH

## 2020-12-08 LAB — MAGNESIUM: Magnesium: 1.9 mg/dL (ref 1.7–2.4)

## 2020-12-08 LAB — RESP PANEL BY RT-PCR (FLU A&B, COVID) ARPGX2
Influenza A by PCR: NEGATIVE
Influenza B by PCR: NEGATIVE
SARS Coronavirus 2 by RT PCR: NEGATIVE

## 2020-12-08 LAB — TROPONIN I (HIGH SENSITIVITY)
Troponin I (High Sensitivity): 16 ng/L (ref <18)
Troponin I (High Sensitivity): 15 ng/L (ref <18)

## 2020-12-08 LAB — COMPREHENSIVE METABOLIC PANEL
ALT: 18 U/L (ref 0–44)
AST: 14 U/L — ABNORMAL LOW (ref 15–41)
Albumin: 2.6 g/dL — ABNORMAL LOW (ref 3.5–5.0)
Alkaline Phosphatase: 348 U/L — ABNORMAL HIGH (ref 38–126)
Anion gap: 7 (ref 5–15)
BUN: 27 mg/dL — ABNORMAL HIGH (ref 8–23)
CO2: 22 mmol/L (ref 22–32)
Calcium: 9.6 mg/dL (ref 8.9–10.3)
Chloride: 110 mmol/L (ref 98–111)
Creatinine, Ser: 1.72 mg/dL — ABNORMAL HIGH (ref 0.61–1.24)
GFR, Estimated: 44 mL/min — ABNORMAL LOW (ref >60)
Glucose, Bld: 97 mg/dL (ref 70–99)
Potassium: 4.5 mmol/L (ref 3.5–5.1)
Sodium: 139 mmol/L (ref 135–145)
Total Bilirubin: 1.1 mg/dL (ref 0.3–1.2)
Total Protein: 7.1 g/dL (ref 6.5–8.1)

## 2020-12-08 LAB — URINALYSIS, ROUTINE W REFLEX MICROSCOPIC
Bacteria, UA: NONE SEEN
Bilirubin Urine: NEGATIVE
Glucose, UA: NEGATIVE mg/dL
Ketones, ur: NEGATIVE mg/dL
Leukocytes,Ua: NEGATIVE
Nitrite: NEGATIVE
Protein, ur: >=300 mg/dL — AB
Specific Gravity, Urine: 1.018 (ref 1.005–1.030)
pH: 5 (ref 5.0–8.0)

## 2020-12-08 LAB — BRAIN NATRIURETIC PEPTIDE: B Natriuretic Peptide: 1213 pg/mL — ABNORMAL HIGH (ref 0.0–100.0)

## 2020-12-08 LAB — PHOSPHORUS: Phosphorus: 3.9 mg/dL (ref 2.5–4.6)

## 2020-12-08 LAB — GLUCOSE, CAPILLARY: Glucose-Capillary: 98 mg/dL (ref 70–99)

## 2020-12-08 LAB — ETHANOL: Alcohol, Ethyl (B): <10 mg/dL (ref <10)

## 2020-12-08 LAB — LIPASE, BLOOD: Lipase: 35 U/L (ref 11–51)

## 2020-12-08 MED ORDER — CARVEDILOL 12.5 MG PO TABS
12.5000 mg | ORAL_TABLET | Freq: Two times a day (BID) | ORAL | Status: DC
  Administered 2020-12-09 – 2020-12-12 (×8): 12.5 mg via ORAL
  Filled 2020-12-08 (×8): qty 1

## 2020-12-08 MED ORDER — INSULIN ASPART 100 UNIT/ML IJ SOLN
0.0000 [IU] | Freq: Three times a day (TID) | INTRAMUSCULAR | Status: DC
  Administered 2020-12-09: 3 [IU] via SUBCUTANEOUS
  Administered 2020-12-13: 2 [IU] via SUBCUTANEOUS

## 2020-12-08 MED ORDER — ATORVASTATIN CALCIUM 10 MG PO TABS
20.0000 mg | ORAL_TABLET | Freq: Every day | ORAL | Status: DC
  Administered 2020-12-08 – 2020-12-14 (×7): 20 mg via ORAL
  Filled 2020-12-08 (×7): qty 2

## 2020-12-08 MED ORDER — SODIUM CHLORIDE 0.9% FLUSH
3.0000 mL | Freq: Two times a day (BID) | INTRAVENOUS | Status: DC
  Administered 2020-12-08 – 2020-12-16 (×12): 3 mL via INTRAVENOUS

## 2020-12-08 MED ORDER — NITROGLYCERIN 2 % TD OINT
1.0000 [in_us] | TOPICAL_OINTMENT | Freq: Four times a day (QID) | TRANSDERMAL | Status: DC
  Administered 2020-12-08 – 2020-12-14 (×23): 1 [in_us] via TOPICAL
  Filled 2020-12-08: qty 30
  Filled 2020-12-08: qty 1

## 2020-12-08 MED ORDER — ENOXAPARIN SODIUM 40 MG/0.4ML IJ SOSY
40.0000 mg | PREFILLED_SYRINGE | INTRAMUSCULAR | Status: DC
  Administered 2020-12-09 – 2020-12-13 (×6): 40 mg via SUBCUTANEOUS
  Filled 2020-12-08 (×6): qty 0.4

## 2020-12-08 MED ORDER — FUROSEMIDE 10 MG/ML IJ SOLN
INTRAMUSCULAR | Status: AC
  Administered 2020-12-08: 40 mg via INTRAVENOUS
  Filled 2020-12-08: qty 4

## 2020-12-08 MED ORDER — NICOTINE 7 MG/24HR TD PT24
7.0000 mg | MEDICATED_PATCH | Freq: Every day | TRANSDERMAL | Status: DC
  Filled 2020-12-08 (×3): qty 1

## 2020-12-08 MED ORDER — FUROSEMIDE 10 MG/ML IJ SOLN
40.0000 mg | Freq: Once | INTRAMUSCULAR | Status: AC
  Filled 2020-12-08: qty 4

## 2020-12-08 MED ORDER — ACETAMINOPHEN 325 MG PO TABS
650.0000 mg | ORAL_TABLET | Freq: Four times a day (QID) | ORAL | Status: DC | PRN
  Administered 2020-12-09 – 2020-12-10 (×2): 650 mg via ORAL
  Filled 2020-12-08 (×2): qty 2

## 2020-12-08 MED ORDER — ACETAMINOPHEN 650 MG RE SUPP: 650.0000 mg | Freq: Four times a day (QID) | RECTAL | Status: DC | PRN

## 2020-12-08 MED ORDER — CARVEDILOL 12.5 MG PO TABS: 25.0000 mg | ORAL_TABLET | Freq: Two times a day (BID) | ORAL | Status: DC

## 2020-12-08 NOTE — H&P (Addendum)
Date: 12/08/2020               Patient Name:  Charles Krueger MRN: [id number]  DOB: 11-11-1955 Age / Sex: 65 y.o., male   PCP: Pcp, No         Medical Service: Internal Medicine Teaching Service         Attending Physician: Dr. Evette Doffing, Mallie Mussel, *    First Contact: Sanjuana Letters, DO Pager: Maryjean Morn [phone]  Second Contact: Jose Persia, MD Pager: IB [phone]       After Hours (After 5p/  First Contact Pager: [phone]  weekends / holidays): Second Contact Pager: [phone]   SUBJECTIVE   Chief Complaint: Shortness of breath   History of Present Illness: Charles Krueger is a 65 y.o. male with a pertinent PMH of diabetes mellitus, ETOH uses disorder, acute pancreatitis (w/ Hx of pseudocyst), HTN, COPD, pulmonary HTN, sarcoidosis, HLD, CAD, CKD (stage 3a), OSA, who presents to [facility] with subacute onset of shortness of breath and diffuse swelling.  Patient states that his symptoms started roughly 3-4 months ago when he presented to [facility] for acute hypoxic respiratory failure thought to be secondary to new onset heart failure.  Patient felt like the treatment team was not communicating appropriately with him, and ultimately decided to leave the hospital prior to being discharged. He states that he remained short of breath after discharge until roughly 2 weeks ago when his symptoms progressed.  He states that he developed significant lower extremity edema, exercise intolerance, orthopnea and paroxysmal nocturnal dyspnea.  The symptoms have limited his activity and has subsequently required to sleep on the couch in order to avoid shortness of breath while sleeping.  He admits to an associated cough with clear sputum, but otherwise denies fevers, chest pain, palpitations, abdominal pain, nausea, diarrhea/constipation, dizziness, or presyncopal symptoms.  He denies any recent changes in his medications, sick contacts, or recent travel. He admits to taking all of his medications as  prescribed with out missed doses, but denies taking any p.o. diuretics.  He has significant abdominal bloating with a subjective weight gain over the past several months, but is unsure of his "dry weight". He denies any increased salt intake in his diet, but states that he has been drinking more fluids recently and attributes it to the warmer weather.  He does have a cardiologist, Dr. Marvel Plan, at the [facility] who he sees regularly.  He also has a pulmonologist for his COPD/pulmonary sarcoid, but admits to missing his last appointment due to being in the hospital in April.  He does admit to  [place] to Doc appointment at [facility] in [place] today. He states that he was having significantly more swelling and shortness of breath and his Doc told him to go hospital   Past Medical History: Vitamin D deficiency  Gout Pulmonary Sarcoidosis HTN HLD Type 2 Diabetes Mellitus  Adrenal Adenoma Allergic rhinitis  CKD (Stage 3a) Hyperparathyroidism COPD (unknown Gold Stage) Paget's Disease OSA  Past Surgical History:  none  Medications:  albuterol sulfate inhaler  fluticasone (FLOVENT DISKUS) 50 MCG/BLIST diskus inhaler  ipratropium (ATROVENT) 0.03% nasal spray 2 sprays  montelukast (SINGULAIR) 10 MG tablet Take 10 mg by mouth at bedtime. Olodaterol HCl 2.5 MCG/ACT AERS Inhale into the lungs.  tiotropium bromide monohydrate (SPIRIVA) 18 mcg inhalation capsule  allopurinol (ZYLOPRIM) 100 mg tablet Take 200 mg by mouth daily amLODIPine besylate (NORVASC) 10 mg tablet Take 10 mg by mouth daily. aspirin (ASPRI-LOW,ECOTRIN LOW DOSE) 81 mg  EC atorvastatin (LIPITOR) 20 mg  carvedilol (COREG) 25 mg tablet Take 25 mg by mouth 2 (two) times daily. cetirizine (ZYRTEC) 10 mg tablet Take 10 mg by mouth daily. ketotifen (ZADITOR,EYE ITCH RELIEF) 0.025% ophthalmic solution losartan-hydrochlorothiazide (HYZAAR) 100-12.5 MG per tablet magnesium oxide 420 mg tablet  metformin (GLUCOPHAGE) 1000 MG tablet Take 1,000 mg  by mouth 2 (two) times daily.  He denies taking any of his medications today   Family History: Mom - CAD with CABG, T2DM Siblings - healthy  Son - MS 4x daughter - healthy  Social:  Lives - [place] with wife Occupation - Soil scientist coordinate for the [employer]  Support - Wife support  Level of function - independent with ADLs [facility]  Substance use - Tobacco (3-4 cigars/week, for 10 year, but smoked cigarette for 20 years at a pack a day), ETOH (5-6 shot/week of brandy) but did have trouble with ETOH use disorder complicated by pancreatitis, no other non prescription drugs   Allergies: Allergies as of 12/08/2020 - Review Complete 12/08/2020  Allergen Reaction Noted   Lisinopril Other (See Comments)     Review of Systems: A complete ROS was negative except as per HPI.   OBJECTIVE:   Physical Exam: Blood pressure (!) 169/90, pulse 69, temperature 97.8 F (36.6 C), temperature source Oral, resp. rate 20, height _0  (1.727 m), weight 102 kg, SpO2 95 %. Physical Exam Constitutional:      General: He is not in acute distress.    Appearance: He is obese. He is not ill-appearing or diaphoretic.  HENT:     Head: Normocephalic and atraumatic.     Mouth/Throat:     Mouth: Mucous membranes are moist.     Pharynx: Oropharynx is clear.  Eyes:     Extraocular Movements: Extraocular movements intact.  Neck:     Vascular: JVD (to the mandible) present.  Cardiovascular:     Rate and Rhythm: Normal rate and regular rhythm.     Pulses: Normal pulses.     Heart sounds: No murmur heard.   No gallop.  Pulmonary:     Effort: Pulmonary effort is normal.     Breath sounds: Examination of the right-lower field reveals rales. Examination of the left-lower field reveals rales. Rales present. No wheezing.  Chest:     Chest wall: No tenderness.  Abdominal:     General: Bowel sounds are normal. There is distension.     Tenderness: There is no abdominal tenderness.   Musculoskeletal:        General: Swelling (diffuse swelling/anasarca) present.     Cervical back: Normal range of motion.     Right lower leg: Edema (3+ to hip) present.     Left lower leg: Edema (3+ to hip) present.  Skin:    General: Skin is warm and dry.     Comments: Clubbing of bilateral fingers  Neurological:     General: No focal deficit present.     Mental Status: He is alert and oriented to person, place, and time.    Labs: CBC    Component Value Date/Time   WBC 7.1 12/08/2020 1453   RBC 4.32 12/08/2020 1453   HGB 10.0 (L) 12/08/2020 1453   HCT 36.0 (L) 12/08/2020 1453   PLT 276 12/08/2020 1453   MCV 83.3 12/08/2020 1453   MCH 23.1 (L) 12/08/2020 1453   MCHC 27.8 (L) 12/08/2020 1453   RDW 17.9 (H) 12/08/2020 1453   LYMPHSABS 0.4 (L) 12/08/2020  1453   MONOABS 0.5 12/08/2020 1453   EOSABS 0.8 (H) 12/08/2020 1453   BASOSABS 0.3 (H) 12/08/2020 1453     CMP     Component Value Date/Time   NA 139 12/08/2020 1453   K 4.5 12/08/2020 1453   CL 110 12/08/2020 1453   CO2 22 12/08/2020 1453   GLUCOSE 97 12/08/2020 1453   BUN 27 (H) 12/08/2020 1453   CREATININE 1.72 (H) 12/08/2020 1453   CALCIUM 9.6 12/08/2020 1453   PROT 7.1 12/08/2020 1453   ALBUMIN 2.6 (L) 12/08/2020 1453   AST 14 (L) 12/08/2020 1453   ALT 18 12/08/2020 1453   ALKPHOS 348 (H) 12/08/2020 1453   BILITOT 1.1 12/08/2020 1453   GFRNONAA 44 (L) 12/08/2020 1453   GFRAA 57 (L) 06/07/2014 1735    Imaging:  Echocardiogram (10/13/2020) - Moderate diastolic dysfunction and elevated LA pressure - LVEF of 50-55% - Mild LA dilated - Mild mitral valve regurgitation  - Mild TV regurgitation  - Rt ventricle systolic pressure elevated > 60 mm Hg  CT Chest wo contrast (10/13/2020) - Mild groundglass and reticular opacities in the perihilar regions and lung bases. These may be due to some early edema, less likely atypical infection.  - Small right pleural effusion with adjacent atelectasis.  - Cardiac  lead with calcific coronary disease.  - Marked mediastinal and to lesser extent hilar adenopathy. This may be due to a benign etiology such as sarcoidosis or due to lymphoma/metastatic disease.  - Enlarged, slightly irregular contour of the liver which could reflect cirrhosis.  - Partially imaged indeterminate left adrenal nodule.  - 7 mm nodule in the lingula. Follow-up as per below.   US Abdomen (10/15/2020) - no ascites present  DG Chest 2 View (12/08/2020) - Stable cardiomediastinal silhouette with mild cardiomegaly.  - No pneumothorax.  - No pleural effusion.  - Mild diffuse hazy and linear parahilar lung opacities, new, favor mild-to-moderate pulmonary edema.   IMPRESSION: Mild-to-moderate congestive heart failure.  EKG: personally reviewed my interpretation is sinus rhythm with PACs and T wave inversions in 2 3 and aVF consistent with previous EKGs and T wave flattening in the lateral/precordial leads compared to previous EKG  ASSESSMENT & PLAN:   Active Problems:   Acute exacerbation of CHF (congestive heart failure) (HCC)   Anemia   Mediastinal lymphadenopathy   COPD (chronic obstructive pulmonary disease) (HCC)   OSA (obstructive sleep apnea)   CKD (chronic kidney disease)   Diabetes mellitus (HCC)   CAD (coronary artery disease)   Hyperlipidemia   Charles Krueger is a 65 y.o. with pertinent PMH of diabetes mellitus, ETOH uses disorder, acute pancreatitis (w/ Hx of pseudocyst), HTN, COPD, pulmonary HTN, sarcoidosis, HLD, CAD, CKD (stage 3a), OSA who presented with subacute worsening of SHOB and diffuse edema and admit for Acute hypoxic respiratory failure and significant volume overload on hospital day 0  #Hypervolemia:  #Pulmonary Edema: #Nephrotic range proteinuria with protein gap  #at risk for hepatic cirrhosis  Patient presents with diffuse edema, orthopnea, decreased exercise tolerance, and new oxygen requirement of 3L [place]. He denies a history of CHF and states that  most of these symptoms seems to have started in March when he was admitted at [facility] for acute hypoxic respiratory failure. Since that admission that patients symptoms have progressed. My differential consist of Nephrotic syndrome vs CHF exacerbation vs decompensated cirrhosis. During his las admission, an echocardiogram was performed and showed EF of 50-55%, mild LA dilation, mild MV/TV regurgitation, and  elevated right ventricular systolic pressure > 60 mm Hg. His increased right ventricular pressure indicated pulmonary etiology with resulting right heart failure, which could explain his significant lower extremity edema and elevated JVD. However, his CXR does show evidence of diffuse pulmonary edema but diffuse pulmonary edema, which suggest associated left heart failure, which is minimally reflected on his echo. He does have some ischemic changes in his inferior EKG leads and Ct scan showing CAD, but denies any chest pain and troponin's are negative. Alternatively, patient does have significant proteinuria (w/ protein gap) with progressive worsening of his underlying CKD . Although this could be secondary to DM nephropathy, it is difficult to exclude nephrotic syndrome secondary to sarcoidosis or paraproteinemia. Patient had a CT scan of his chest in march which showed some nodular irregularities of his liver concerning for cirrhosis. There was a follow up abdominal US that made not note of his cirrhosis, but stated no ascites present at that time. Patient was give furosemide 40 mg IV in the ED with good urine output. We will continue diuresis while exploring the etiology of his hypervolemia.  - Continue furosemide 40 mg IV  - replete electrolytes as needed - Repeat Echocardiogram to access acute changes to heart function - Strict INO and Daily weights (unsure of dry weight) - Continue supplemental O2 - order protein/creatine ration, random urine Na and creatine  - Will get a RUQ [place] to determine if  patient has underlying cirrhosis.   #Acute Hypoxic Respiratory Failure: #COPD #OSA #Pulmonary Sarcoidosis  Patient presents with acute hypoxia in the 80's requiring 3 L [place] supplemental O2. This is likely secondary to his hypervolemia/pulmonary edema, but cannot exclude mutlifactorial etiology in the setting of significant COPD, OSA, and pulmonary sarcoidosis. He does not have any PFTs on file but admits to taking numerous inhaled COPD medications including SABA, LAMA/LABA combo, and ICS. This makes me believe he is a GOLD stage C or D, although he denies multiple admission for COPD in the past year. HE admits to OSA but denies CPAP use currently. I cannot see his past work-up for pulmonary sarcoidosis, but he does have hypercalcemia on his metabolic panel and evidence of perihilar/mediastinal LAD on recent CT scan. Will need additional history regarding systemic/B symptoms and environmental exposures to further evaluate the likely hood of malignancy, fungal infections or pneumoconiosis. Particularly in the setting of his significant tobacco use history.  - Pharmacy consult for medication reconciliation - Restart home inhalers.  - Continue supplemental O2. Titrate to achieve a goal sat of 90-93%  #Chronic Kidney Disease (Stage 3a) Patient has a history of CKD stage 3a, although it is difficult to assess his baseline kidney function as I do not have the [place] records. His Cr is 1.72 and a GFR of 44, which would indicate progression of his CKD to stage 3b. Unsure if this is acute or chronic. He denies any hematuria, dysuria, or oliguria leading up to this admission. His kidney disease is likely secondary to longstanding diabetes, but due to the degree of peripheral edema and proteinuria, it is difficult to exclude a new diagnosis of nephrotic syndrome. Furthermore, patient does meat 3/4 CRAB criteria for MM, including hypercalcemia, renal dysfunction, and anemia. Will get preliminary work up and consider  nephrology consult for guidance with this complex case.  - Random urine Na and Cr - Protein/Cr ration  - Consider SPEP/UPEP/ IF/light chains.  - Monitor kidney function and electrolytes  - Renal US - Avoid nephrotoxic medications, including ACEI/ARBS  #  Normocytic Anemia: Presents with a normocytic anemia. Although likely due to his CKD, I cannot see where this has been worked up in the past. He had a colonoscopy in 2018 with two tubular adenomas removed. He does have an elevated RDW, which likely indicated a component of iron deficiency anemia.  - Reticulocyte count - Ferritin - B12 and folate   #Diabetes Mellitus, Type 2: Patient has a longstanding history of type 2 diabetes. His last a1c was 5.9 back in March of 2022. He is currently on metformin 1000 mg Bid. He does have a history of poorly controled diabetes in the past. - CBG monitoring - Will start SSI  - Will hold off metformin in the setting of worsening kidney function.   #Hypertension  Patient states that he has a history of HTN on losartan-HCTz 100-12.5 mg and carvedilol 25 mg BID. He states that he did not take this medication today.  - Hold losartan-HCTz while diuresing - Continue carvedilol at reduced dose of 12.5 mg BID  #Hyperlipidemia  #CAD Has a history of HLD on atorvastatin 20 mg daily. He had a recent CT chest showing coronary calcifications. He denies any history of chest pain or ACS. Had a EKG showing some T waves changes in inferior leads.  - Continue ASA 81 mg - Continue atorvastatin 20 mg  - Continue carvedilol 12.5 mg BID  #Elevated Alkaline Phosphatase:  #Hypercalcemia: Patient has a history of elevated alk phos and hypercalcemia. Chart reviews shows a hisotry of sarcoid, Paget's disease and CKD, which complicate the etiology. Since he is a [place] patient, I cannot see the original work of for sarcoid or paget's.  - [facility] - [facility] encouraged   Tobacco-use  disorder: Patient states that he is smoked cigarettes for a large portion of his life particularly while he was in the [employer].  Currently he is smoking 3 to 5 cigars daily.  I counseled him on the importance of tobacco cessation. -Consult for [facility] for tobacco cessation resources. -We will likely need nicotine replacement during the hospital.  ETOH use disorder: Patient has a history etoh use disorder and apparent had an episode of withdrawal several years ago. He states that he only drinks 3-5 shots of brandy per week.  - CIWA without ativan.   Diet: Heart Healthy VTE: Enoxaparin IVF: None,None Code: Full  Prior to Admission Living Arrangement: Home, living wife Anticipated Discharge Location: Home Barriers to Discharge: further work up  Dispo: Admit patient to Inpatient with expected length of stay greater than 2 midnights.  Signed: Lawerance Cruel, D.O.  Internal Medicine Resident, PGY-2 [facility]  Pager: [phone] 11:47 PM, 12/08/2020   Please contact the on call pager after 5 pm and on weekends at [phone].

## 2020-12-08 NOTE — ED Notes (Signed)
Attempted to call report to floor. Per Diplomatic Services operational officer, room assignments are being re-arranged to make accommodations. Phone number and name left for callback.

## 2020-12-08 NOTE — ED Triage Notes (Signed)
Pt reports going to [facility] hospital for follow up appt and sent here due to swelling in his legs and abd. Spo2 82% on room air, improves to 95% on O2, pt denies using home O2 on regular basis.

## 2020-12-08 NOTE — ED Provider Notes (Signed)
Emergency Medicine Provider Triage Evaluation Note  Charles Krueger , a 65 y.o. male  was evaluated in triage.  Pt complains of shortness of breath and lower extremity swelling over the past several days.  Review of Systems  Positive: Shortness of breath; lower extremity edema, abdominal distention Negative: Chest pain, syncope, vomiting, diarrhea, abdominal pain  Physical Exam  BP (!) 182/102 (BP Location: Right Arm)   Pulse 82   Temp 98 F (36.7 C) (Oral)   Resp (!) 26   SpO2 (!) 82%  Gen:   Awake, no distress   Resp:  Increased effort, especially with ambulation.  SPO2 dropped to 82% on room air.  Recovered to 95% on 3 L supplemental O2.  Lung sounds diminished MSK:   Moves extremities without difficulty, bilateral pitting lower extremity edema Other:    Medical Decision Making  Medically screening exam initiated at 2:51 PM.  Appropriate orders placed.  Charles Krueger was informed that the remainder of the evaluation will be completed by another provider, this initial triage assessment does not replace that evaluation, and the importance of remaining in the ED until their evaluation is complete.     Charles Pancoast, PA-C 12/08/20 1455    Charles Distance, MD 12/08/20 1710

## 2020-12-08 NOTE — Hospital Course (Addendum)
Nodules greater than 6 and up to 21mm:  If low risk, follow up CT at 6-12 months, then at 18-24 months if unchanged.  For patients at increased risk of malignancy, follow up CT at 3-6 months, then 9-12, and at 24 months if nochange.      Creatinine from March (1.58) to yesterday [facility] (1.66)  Albumin yest 2.5, calcium 9.5   LDL yesterday 37 Cholesterol 86 a1c yesterday 6.9% tsh 1.84   (06/13/2020) SPEP: Negative for M-spike UPEP : Negative for M-spike   UPCR: 2.211 g  UACR: 1.511 g   Hepatitis B surface antigen: Negative  Hepatitis B immunity noted Hepatitis C antibody: Negative  ANA: Negative ANCA: Negative  Kappa: 86.9  Lambda: 36.1  K/L ratio: 2.41  C3: 179  C4: 47  Creatinine: 1.60    ?? Anti-PLA2R ?? HIV   Referred for colonoscopy in January 2022 CA-19 elevated at 53 in August 29, 2020.  Vitamin D: 14.25 (March 22,2022) Colonoscopy: (March 18,2022): polpys removed 8, (all tubular adenoma, one tubulovillous adenoma)   Concern for primary hyperparathyroidism   Coreg was increased to 25 mg BID on 08/31/2020. He was also on Amlodipine 10.

## 2020-12-08 NOTE — ED Provider Notes (Signed)
[facility] EMERGENCY DEPARTMENT Provider Note   CSN: [id number] Arrival date & time: 12/08/20  1409     History Chief Complaint  Patient presents with   Leg Swelling   Shortness of Breath    Charles Krueger is a 65 y.o. male.  HPI Patient reports that he has gotten increasingly swollen and short of breath over the past week.  He reports he has been on Lasix in the past but is not a regular medication for him.  He reports that his legs and scrotum got very swollen.  He reports are little less swollen today than they were previously.  He went into the [facility] hospital today for a checkup for the symptoms and was identified to be hypoxic in significantly volume overloaded.  He was instructed to come to the emergency department for admission to the hospital.    Past Medical History:  Diagnosis Date   Diabetes mellitus without complication [facility])     Patient Active Problem List   Diagnosis Date Noted   DEFICIENCY, NUTRITIONAL NOS 09/24/2006   PSEUDOCYST, PANCREAS 09/24/2006   ALCOHOL ABUSE 08/15/2006   HYPERTENSION 08/15/2006   Acute pancreatitis 08/14/2006    History reviewed. No pertinent surgical history.     History reviewed. No pertinent family history.  Social History   Tobacco Use   Smoking status: Some Days    Pack years: 0.00    Types: Cigarettes  Substance Use Topics   Alcohol use: Yes   Drug use: No    Home Medications Prior to Admission medications   Medication Sig Start Date End Date Taking? Authorizing Provider  amLODipine (NORVASC) 10 MG tablet Take 10 mg by mouth daily.    [provider]  aspirin 81 MG tablet Take 81 mg by mouth daily.    [provider]  atorvastatin (LIPITOR) 40 MG tablet Take 20 mg by mouth daily.    [provider]  hydrochlorothiazide (HYDRODIURIL) 25 MG tablet Take 25 mg by mouth daily.    [provider]  insulin aspart (NOVOLOG) 100 UNIT/ML injection Inject 10-20 Units into  the skin See admin instructions. 20units in the morning and 10 units in the afternoon    [provider]  losartan (COZAAR) 100 MG tablet Take 100 mg by mouth daily.    [provider]  metFORMIN (GLUCOPHAGE) 1000 MG tablet Take 1,000 mg by mouth 2 (two) times daily with a meal.    [provider]  metFORMIN (GLUCOPHAGE) 500 MG tablet Take 500 mg by mouth 2 (two) times daily with a meal.    [provider]  metoprolol (LOPRESSOR) 50 MG tablet Take 50 mg by mouth 2 (two) times daily.    [provider]  Multiple Vitamin (MULTIVITAMIN WITH MINERALS) TABS tablet Take 1 tablet by mouth daily.    [provider]  OVER THE COUNTER MEDICATION Take 1 tablet by mouth daily. Ultra Mega 3    [provider]    Allergies    Lisinopril  Review of Systems   Review of Systems 10 Systems reviewed and negative except as per HPI Physical Exam Updated Vital Signs BP (!) 174/87   Pulse 79   Temp 98.9 F (37.2 C)   Resp 19   SpO2 91%   Physical Exam Constitutional:      Comments: Moderate increased work of breathing at rest.  Mild exertion patient is visibly dyspneic.  Mental Status clear.  Answering questions in full sentences  HENT:  Mouth/Throat:     Pharynx: Oropharynx is clear.  Eyes:     Extraocular Movements: Extraocular movements intact.  Cardiovascular:     Rate and Rhythm: Normal rate and regular rhythm.  Pulmonary:     Comments: Moderate increased work of breathing.  Crackles to mid lung fields Abdominal:     Comments: Abdomen is distended and edematous on the lower abdomen.  Genitourinary:    Comments: Scrotum diffuse and symmetric mild to large edema. Musculoskeletal:     Comments: 3+ tight edema bilateral lower extremities up to the thighs and scrotum  Skin:    General: Skin is warm and dry.  Neurological:     General: No focal deficit present.     Mental Status: He is oriented to person, place, and time.      Motor: No weakness.     Coordination: Coordination normal.  Psychiatric:        Mood and Affect: Mood normal.    ED Results / Procedures / Treatments   Labs (all labs ordered are listed, but only abnormal results are displayed) Labs Reviewed  COMPREHENSIVE METABOLIC PANEL - Abnormal; Notable for the following components:      Result Value   BUN 27 (*)    Creatinine, Ser 1.72 (*)    Albumin 2.6 (*)    AST 14 (*)    Alkaline Phosphatase 348 (*)    GFR, Estimated 44 (*)    All other components within normal limits  CBC WITH DIFFERENTIAL/PLATELET - Abnormal; Notable for the following components:   Hemoglobin 10.0 (*)    HCT 36.0 (*)    MCH 23.1 (*)    MCHC 27.8 (*)    RDW 17.9 (*)    Lymphs Abs 0.4 (*)    Eosinophils Absolute 0.8 (*)    Basophils Absolute 0.3 (*)    nRBC 1 (*)    All other components within normal limits  BRAIN NATRIURETIC PEPTIDE - Abnormal; Notable for the following components:   B Natriuretic Peptide 1,213.0 (*)    All other components within normal limits  RESP PANEL BY RT-PCR (FLU A&B, COVID) ARPGX2  ETHANOL  LIPASE, BLOOD  URINALYSIS, ROUTINE W REFLEX MICROSCOPIC  MAGNESIUM  PHOSPHORUS  TROPONIN I (HIGH SENSITIVITY)  TROPONIN I (HIGH SENSITIVITY)    EKG EKG Interpretation  Date/Time:  Thursday December 08 2020 14:26:41 EDT Ventricular Rate:  97 PR Interval:  198 QRS Duration: 106 QT Interval:  332 QTC Calculation: 421 R Axis:   -14 Text Interpretation: Sinus rhythm with Premature atrial complexes Inferior infarct , age undetermined Possible Anterior infarct , age undetermined Abnormal ECG lateral t wave flattening more pronounced c/w previous Confirmed by Arby Barrette [phone]) on 12/08/2020 4:34:35 PM  Radiology DG Chest 2 View  Result Date: 12/08/2020 CLINICAL DATA:  Lower extremity edema and dyspnea for 2 EXAM: CHEST - 2 VIEW COMPARISON:  06/03/2014 chest radiograph. FINDINGS: Stable cardiomediastinal silhouette with mild cardiomegaly. No  pneumothorax. No pleural effusion. Mild diffuse hazy and linear parahilar lung opacities, new, favor mild-to-moderate pulmonary edema. IMPRESSION: Mild-to-moderate congestive heart failure. Electronically Signed   By: Delbert Phenix M.D.   On: 12/08/2020 15:48    Procedures Procedures  CRITICAL CARE Performed by: Arby Barrette   Total critical care time: 30 minutes  Critical care time was exclusive of separately billable procedures and treating other patients.  Critical care was necessary to treat or prevent imminent or life-threatening deterioration.  Critical care was time spent personally by me on the  following activities: development of treatment plan with patient and/or surrogate as well as nursing, discussions with consultants, evaluation of patient's response to treatment, examination of patient, obtaining history from patient or surrogate, ordering and performing treatments and interventions, ordering and review of laboratory studies, ordering and review of radiographic studies, pulse oximetry and re-evaluation of patient's condition.  Medications Ordered in ED Medications  nitroGLYCERIN (NITROGLYN) 2 % ointment 1 inch (1 inch Topical Given 12/08/20 1720)  furosemide (LASIX) injection 40 mg (40 mg Intravenous Given 12/08/20 1708)    ED Course  I have reviewed the triage vital signs and the nursing notes.  Pertinent labs & imaging results that were available during my care of the patient were reviewed by me and considered in my medical decision making (see chart for details).    MDM Rules/Calculators/A&P                       Patient is sent from [facility] for congestive heart failure.  Patient has vascular congestion on chest x-ray.  BNP greater than thousand.  Clinically patient appears significantly volume overloaded.  Patient is maintaining his own airway.  His mental status is clear.  Blood pressures are moderately hypertensive.  Patient started on Lasix and nitroglycerin  paste.  Tolerating this well and starting to have urine output.  Patient will need admission for acute CHF exacerbation with hypoxic respiratory failure requiring 3 L oxygen.  Baseline, patient reports he does not wear oxygen at home.  Does wear nighttime CPAP. Final Clinical Impression(s) / ED Diagnoses Final diagnoses:  Acute on chronic congestive heart failure, unspecified heart failure type (HCC)  Acute hypoxemic respiratory failure (HCC)    Rx / DC Orders ED Discharge Orders     None        Arby Barrette, MD 12/08/20 [phone]

## 2020-12-09 ENCOUNTER — Encounter (HOSPITAL_COMMUNITY): Payer: Self-pay | Admitting: Student in an Organized Health Care Education/Training Program

## 2020-12-09 ENCOUNTER — Inpatient Hospital Stay (HOSPITAL_COMMUNITY): Payer: No Typology Code available for payment source

## 2020-12-09 DIAGNOSIS — M8889 Osteitis deformans of multiple sites: Secondary | ICD-10-CM | POA: Insufficient documentation

## 2020-12-09 DIAGNOSIS — I5033 Acute on chronic diastolic (congestive) heart failure: Secondary | ICD-10-CM

## 2020-12-09 LAB — PROTEIN / CREATININE RATIO, URINE
Creatinine, Urine: 159.02 mg/dL
Protein Creatinine Ratio: 3.07 mg/mg{Cre} — ABNORMAL HIGH (ref 0.00–0.15)
Total Protein, Urine: 488 mg/dL

## 2020-12-09 LAB — ECHOCARDIOGRAM COMPLETE
AR max vel: 1.95 cm2
AV Area VTI: 1.96 cm2
AV Area mean vel: 1.84 cm2
AV Mean grad: 4 mmHg
AV Peak grad: 8.5 mmHg
Ao pk vel: 1.46 m/s
Area-P 1/2: 6.27 cm2
Height: 68 in
MV M vel: 3.62 m/s
MV Peak grad: 52.4 mmHg
Radius: 0.2 cm
S' Lateral: 4.2 cm
Single Plane A2C EF: 37.5 %
Weight: 3576.74 oz

## 2020-12-09 LAB — COMPREHENSIVE METABOLIC PANEL
ALT: 18 U/L (ref 0–44)
AST: 17 U/L (ref 15–41)
Albumin: 2.6 g/dL — ABNORMAL LOW (ref 3.5–5.0)
Alkaline Phosphatase: 350 U/L — ABNORMAL HIGH (ref 38–126)
Anion gap: 8 (ref 5–15)
BUN: 25 mg/dL — ABNORMAL HIGH (ref 8–23)
CO2: 24 mmol/L (ref 22–32)
Calcium: 9.7 mg/dL (ref 8.9–10.3)
Chloride: 106 mmol/L (ref 98–111)
Creatinine, Ser: 1.7 mg/dL — ABNORMAL HIGH (ref 0.61–1.24)
GFR, Estimated: 45 mL/min — ABNORMAL LOW (ref >60)
Glucose, Bld: 95 mg/dL (ref 70–99)
Potassium: 4.1 mmol/L (ref 3.5–5.1)
Sodium: 138 mmol/L (ref 135–145)
Total Bilirubin: 0.6 mg/dL (ref 0.3–1.2)
Total Protein: 7.6 g/dL (ref 6.5–8.1)

## 2020-12-09 LAB — CBC
HCT: 36.6 % — ABNORMAL LOW (ref 39.0–52.0)
Hemoglobin: 10.5 g/dL — ABNORMAL LOW (ref 13.0–17.0)
MCH: 23.6 pg — ABNORMAL LOW (ref 26.0–34.0)
MCHC: 28.7 g/dL — ABNORMAL LOW (ref 30.0–36.0)
MCV: 82.4 fL (ref 80.0–100.0)
Platelets: 294 10*3/uL (ref 150–400)
RBC: 4.44 MIL/uL (ref 4.22–5.81)
RDW: 17.9 % — ABNORMAL HIGH (ref 11.5–15.5)
WBC: 6.9 10*3/uL (ref 4.0–10.5)
nRBC: 0.4 % — ABNORMAL HIGH (ref 0.0–0.2)

## 2020-12-09 LAB — VITAMIN B12: Vitamin B-12: 485 pg/mL (ref 180–914)

## 2020-12-09 LAB — RETICULOCYTES
Immature Retic Fract: 30.1 % — ABNORMAL HIGH (ref 2.3–15.9)
RBC.: 4.43 MIL/uL (ref 4.22–5.81)
Retic Count, Absolute: 57.1 10*3/uL (ref 19.0–186.0)
Retic Ct Pct: 1.3 % (ref 0.4–3.1)

## 2020-12-09 LAB — GLUCOSE, CAPILLARY
Glucose-Capillary: 107 mg/dL — ABNORMAL HIGH (ref 70–99)
Glucose-Capillary: 229 mg/dL — ABNORMAL HIGH (ref 70–99)
Glucose-Capillary: 89 mg/dL (ref 70–99)
Glucose-Capillary: 140 mg/dL — ABNORMAL HIGH (ref 70–99)

## 2020-12-09 LAB — SODIUM, URINE, RANDOM: Sodium, Ur: 38 mmol/L

## 2020-12-09 LAB — IRON AND TIBC
Iron: 24 ug/dL — ABNORMAL LOW (ref 45–182)
Saturation Ratios: 5 % — ABNORMAL LOW (ref 17.9–39.5)
TIBC: 438 ug/dL (ref 250–450)
UIBC: 414 ug/dL

## 2020-12-09 LAB — CK: Total CK: 77 U/L (ref 49–397)

## 2020-12-09 LAB — PROTIME-INR
INR: 1.2 (ref 0.8–1.2)
Prothrombin Time: 15.7 seconds — ABNORMAL HIGH (ref 11.4–15.2)

## 2020-12-09 LAB — APTT: aPTT: 38 seconds — ABNORMAL HIGH (ref 24–36)

## 2020-12-09 LAB — FOLATE: Folate: 7 ng/mL (ref >5.9)

## 2020-12-09 LAB — CREATININE, URINE, RANDOM: Creatinine, Urine: 154.24 mg/dL

## 2020-12-09 LAB — HIV ANTIBODY (ROUTINE TESTING W REFLEX): HIV Screen 4th Generation wRfx: NONREACTIVE

## 2020-12-09 LAB — FERRITIN: Ferritin: 23 ng/mL — ABNORMAL LOW (ref 24–336)

## 2020-12-09 MED ORDER — FUROSEMIDE 10 MG/ML IJ SOLN
40.0000 mg | Freq: Once | INTRAMUSCULAR | Status: AC
  Administered 2020-12-09: 40 mg via INTRAVENOUS
  Filled 2020-12-09: qty 4

## 2020-12-09 MED ORDER — FUROSEMIDE 10 MG/ML IJ SOLN
40.0000 mg | Freq: Two times a day (BID) | INTRAMUSCULAR | Status: DC
  Administered 2020-12-09: 40 mg via INTRAVENOUS
  Filled 2020-12-09: qty 4

## 2020-12-09 MED ORDER — ALBUTEROL SULFATE (2.5 MG/3ML) 0.083% IN NEBU: 2.5000 mg | INHALATION_SOLUTION | Freq: Four times a day (QID) | RESPIRATORY_TRACT | Status: DC | PRN

## 2020-12-09 MED ORDER — UMECLIDINIUM BROMIDE 62.5 MCG/INH IN AEPB
1.0000 | INHALATION_SPRAY | Freq: Every day | RESPIRATORY_TRACT | Status: DC
  Administered 2020-12-10 – 2020-12-17 (×8): 1 via RESPIRATORY_TRACT
  Filled 2020-12-09: qty 7

## 2020-12-09 MED ORDER — IPRATROPIUM BROMIDE 0.03 % NA SOLN
2.0000 | Freq: Every day | NASAL | Status: DC
  Filled 2020-12-09 (×2): qty 30

## 2020-12-09 MED ORDER — ARFORMOTEROL TARTRATE 15 MCG/2ML IN NEBU
15.0000 ug | INHALATION_SOLUTION | Freq: Two times a day (BID) | RESPIRATORY_TRACT | Status: DC
  Administered 2020-12-09 – 2020-12-17 (×17): 15 ug via RESPIRATORY_TRACT
  Filled 2020-12-09 (×18): qty 2

## 2020-12-09 MED ORDER — ALBUTEROL SULFATE HFA 108 (90 BASE) MCG/ACT IN AERS: 2.0000 | INHALATION_SPRAY | Freq: Four times a day (QID) | RESPIRATORY_TRACT | Status: DC | PRN

## 2020-12-09 MED ORDER — LOSARTAN POTASSIUM 25 MG PO TABS
25.0000 mg | ORAL_TABLET | Freq: Every day | ORAL | Status: DC
  Administered 2020-12-09 – 2020-12-12 (×4): 25 mg via ORAL
  Filled 2020-12-09 (×3): qty 1

## 2020-12-09 MED ORDER — IPRATROPIUM BROMIDE 0.06 % NA SOLN
1.0000 | Freq: Every day | NASAL | Status: DC
  Administered 2020-12-09 – 2020-12-17 (×8): 1 via NASAL
  Filled 2020-12-09: qty 15

## 2020-12-09 NOTE — Progress Notes (Addendum)
HD#1 Subjective:  Overnight Events: No acute events overnight  Charles Krueger is sitting up at the edge of the bed with his wife at the bedside.  He is able to speak in complete sentences without any evidence of shortness of breath.  Patient denies being on a diuretic at home.  Discussed with him that we will continue to diurese him during his hospitalization and try to determine what caused his heart failure.  He was having protein leak out of his kidney and that this can also lead to swelling.  We are working to determine the cause of this as well.  Patient acknowledges understanding.  Discussing compression stockings patient states that he is willing to try but in the past he is not been able to wear them secondary to pain.  Objective:  Vital signs in last 24 hours: Vitals:   12/09/20 0624 12/09/20 0733 12/09/20 0846 12/09/20 1124  BP: (!) 169/94 (!) 171/99  (!) 156/96  Pulse: 75 71  75  Resp:  20  18  Temp:  98.9 F (37.2 C)  97.9 F (36.6 C)  TempSrc:  Oral  Oral  SpO2: 91% 95% 94% 92%  Weight:      Height:       Supplemental O2: Nasal Cannula SpO2: 92 % O2 Flow Rate (L/min): 3 L/min   Physical Exam:  Constitutional: No acute distress, comfortable appearing HENT: normocephalic atraumatic Eyes: conjunctiva non-erythematous Neck: supple Cardiovascular: regular rate and rhythm, no m/r/g.  JVD.  +4 pitting edema up to his waist. Pulmonary/Chest: Normal work of breathing on 3 L nasal cannula, crackles to lung bases Abdominal: soft, non-tender, distended MSK: normal bulk and tone Neurological: alert & oriented x 3 Skin: Extremities are warm Psych: Normal mood and thought process Filed Weights   12/08/20 2100 12/08/20 2342 12/09/20 0353  Weight: 105.2 kg 102 kg 101.4 kg     Intake/Output Summary (Last 24 hours) at 12/09/2020 1338 Last data filed at 12/09/2020 1200 Gross per 24 hour  Intake 965 ml  Output 2400 ml  Net -1435 ml   Net IO Since Admission: -1,435 mL  [12/09/20 1338]  Pertinent Labs: CBC Latest Ref Rng & Units 12/09/2020 12/08/2020 06/07/2014  WBC 4.0 - 10.5 K/uL 6.9 7.1 8.1  Hemoglobin 13.0 - 17.0 g/dL 10.5(L) 10.0(L) 17.0  Hematocrit 39.0 - 52.0 % 36.6(L) 36.0(L) 46.5  Platelets 150 - 400 K/uL 294 276 175    CMP Latest Ref Rng & Units 12/09/2020 12/08/2020 06/07/2014  Glucose 70 - 99 mg/dL 95 97 161(W)  BUN 8 - 23 mg/dL 96(E) 45(W) 09(W)  Creatinine 0.61 - 1.24 mg/dL 1.19(J) 4.78(G) 9.56(O)  Sodium 135 - 145 mmol/L 138 139 129(L)  Potassium 3.5 - 5.1 mmol/L 4.1 4.5 4.5  Chloride 98 - 111 mmol/L 106 110 91(L)  CO2 22 - 32 mmol/L 24 22 23   Calcium 8.9 - 10.3 mg/dL 9.7 9.6 11.2(H)  Total Protein 6.5 - 8.1 g/dL 7.6 7.1 7.9  Total Bilirubin 0.3 - 1.2 mg/dL 0.6 1.1 0.5  Alkaline Phos 38 - 126 U/L 350(H) 348(H) 161(H)  AST 15 - 41 U/L 17 14(L) 18  ALT 0 - 44 U/L 18 18 26     Imaging: DG Chest 2 View  Result Date: 12/08/2020 CLINICAL DATA:  Lower extremity edema and dyspnea for 2 EXAM: CHEST - 2 VIEW COMPARISON:  06/03/2014 chest radiograph. FINDINGS: Stable cardiomediastinal silhouette with mild cardiomegaly. No pneumothorax. No pleural effusion. Mild diffuse hazy and linear parahilar lung opacities,  new, favor mild-to-moderate pulmonary edema. IMPRESSION: Mild-to-moderate congestive heart failure. Electronically Signed   By: Delbert Phenix M.D.   On: 12/08/2020 15:48   US Abdomen Complete  Result Date: 12/08/2020 CLINICAL DATA:  65 year old male with cirrhosis and chronic kidney disease. EXAM: ABDOMEN ULTRASOUND COMPLETE COMPARISON:  None. FINDINGS: Gallbladder: No gallstone. Mildly thickened gallbladder wall, likely related to underlying liver disease and ascites. Negative sonographic Murphy's sign. Common bile duct: Diameter: 7 mm Liver: Morphologic changes of cirrhosis. Portal vein is patent on color Doppler imaging with normal direction of blood flow towards the liver. IVC: No abnormality visualized. Pancreas: Visualized portion  unremarkable. Spleen: Size and appearance within normal limits. Right Kidney: Length: 11 cm. There is mild increased renal echogenicity. There is a 14 mm interpolar cyst. No hydronephrosis or shadowing stone Left Kidney: Length: 11 mm. Mildly increased echogenicity. No hydronephrosis or shadowing stone. Abdominal aorta: No aneurysm visualized. Other findings: None. IMPRESSION: 1. Cirrhosis. 2. Small ascites. 3. Patent main portal vein with hepatopetal flow. 4. Echogenic kidneys in keeping with chronic kidney disease. No hydronephrosis or shadowing stone. Electronically Signed   By: Elgie Collard M.D.   On: 12/08/2020 22:30   ECHOCARDIOGRAM COMPLETE  Result Date: 12/09/2020    ECHOCARDIOGRAM REPORT   Patient Name:   Charles Krueger Date of Exam: 12/09/2020 Medical Rec #:  [id number]    Height:       68.0 in Accession #:    [id number]   Weight:       223.5 lb Date of Birth:  1955/08/09    BSA:          2.143 m Patient Age:    64 years     BP:           169/94 mmHg Patient Gender: M            HR:           71 bpm. Exam Location:  Inpatient Procedure: 2D Echo, Cardiac Doppler and Color Doppler                     STAT ECHO Reported to: Lina Sayre on 12/09/2020 8:28:00 AM. Indications:    CHF  History:        Patient has no prior history of Echocardiogram examinations.                 Risk Factors:Diabetes and Hypertension.  Sonographer:    Neomia Dear RDCS Referring Phys: BENJAMIN COE IMPRESSIONS  1. Left ventricular ejection fraction, by estimation, is 30 to 35%. The left ventricle has moderately decreased function. The left ventricle has no regional wall motion abnormalities. Left ventricular diastolic parameters are indeterminate.  2. The RV wall motion is not seen as well as we would like but there is contractility of the apex with reduced contractility of the mid RV and base ( McConnels Sign)     Consider evaluation for pulmonary embolus is clinically indicated.     . Right ventricular systolic function is  mildly reduced. The right ventricular size is moderately enlarged. There is mildly elevated pulmonary artery systolic pressure. The estimated right ventricular systolic pressure is 38.8 mmHg.  3. Right atrial size was mildly dilated.  4. The mitral valve is grossly normal. Mild mitral valve regurgitation.  5. Tricuspid valve regurgitation is mild to moderate.  6. The aortic valve is grossly normal. Aortic valve regurgitation is not visualized. Mild aortic valve stenosis. FINDINGS  Left Ventricle: Left ventricular ejection fraction,  by estimation, is 30 to 35%. The left ventricle has moderately decreased function. The left ventricle has no regional wall motion abnormalities. The left ventricular internal cavity size was normal in size. There is no left ventricular hypertrophy. Left ventricular diastolic parameters are indeterminate. Right Ventricle: The RV wall motion is not seen as well as we would like but there is contractility of the apex with reduced contractility of the mid RV and base ( McConnels Sign) Consider evaluation for pulmonary embolus is clinically indicated. The right ventricular size is moderately enlarged. Right vetricular wall thickness was not well visualized. Right ventricular systolic function is mildly reduced. There is mildly elevated pulmonary artery systolic pressure. The tricuspid regurgitant velocity is 2.99 m/s, and with an assumed right atrial pressure of 3 mmHg, the estimated right ventricular systolic pressure is 38.8 mmHg. Left Atrium: Left atrial size was normal in size. Right Atrium: Right atrial size was mildly dilated. Pericardium: There is no evidence of pericardial effusion. Mitral Valve: The mitral valve is grossly normal. Mild mitral valve regurgitation. Tricuspid Valve: The tricuspid valve is normal in structure. Tricuspid valve regurgitation is mild to moderate. Aortic Valve: The aortic valve is grossly normal. Aortic valve regurgitation is not visualized. Mild aortic  stenosis is present. Aortic valve mean gradient measures 4.0 mmHg. Aortic valve peak gradient measures 8.5 mmHg. Aortic valve area, by VTI measures 1.96 cm. Pulmonic Valve: The pulmonic valve was grossly normal. Pulmonic valve regurgitation is not visualized. Aorta: The aortic root and ascending aorta are structurally normal, with no evidence of dilitation. IAS/Shunts: The atrial septum is grossly normal.  LEFT VENTRICLE PLAX 2D LVIDd:         6.10 cm     Diastology LVIDs:         4.20 cm     LV e' medial:    5.43 cm/s LV PW:         1.20 cm     LV E/e' medial:  21.4 LV IVS:        1.50 cm     LV e' lateral:   5.56 cm/s LVOT diam:     2.10 cm     LV E/e' lateral: 20.9 LV SV:         54 LV SV Index:   25 LVOT Area:     3.46 cm  LV Volumes (MOD) LV vol d, MOD A2C: 98.3 ml LV vol d, MOD A4C: 71.9 ml LV vol s, MOD A2C: 61.4 ml LV SV MOD A2C:     36.9 ml LV SV MOD A4C:     71.9 ml RIGHT VENTRICLE RV Basal diam:  4.10 cm RV Mid diam:    2.30 cm RV S prime:     9.75 cm/s TAPSE (M-mode): 1.6 cm LEFT ATRIUM             Index       RIGHT ATRIUM           Index LA Vol (A2C):   65.3 ml 30.48 ml/m RA Area:     22.20 cm LA Vol (A4C):   63.8 ml 29.77 ml/m RA Volume:   73.00 ml  34.07 ml/m LA Biplane Vol: 64.7 ml 30.19 ml/m  AORTIC VALVE                   PULMONIC VALVE AV Area (Vmax):    1.95 cm    PV Vmax:       0.91 m/s AV Area (Vmean):  1.84 cm    PV Vmean:      62.100 cm/s AV Area (VTI):     1.96 cm    PV VTI:        0.192 m AV Vmax:           146.00 cm/s PV Peak grad:  3.3 mmHg AV Vmean:          96.500 cm/s PV Mean grad:  2.0 mmHg AV VTI:            0.277 m AV Peak Grad:      8.5 mmHg AV Mean Grad:      4.0 mmHg LVOT Vmax:         82.00 cm/s LVOT Vmean:        51.400 cm/s LVOT VTI:          0.157 m LVOT/AV VTI ratio: 0.57  AORTA Ao Root diam: 3.90 cm Ao Asc diam:  3.40 cm MITRAL VALVE                 TRICUSPID VALVE MV Area (PHT): 6.27 cm      TR Peak grad:   35.8 mmHg MV Decel Time: 121 msec      TR Vmax:         299.00 cm/s MR Peak grad:    52.4 mmHg MR Vmax:         362.00 cm/s SHUNTS MR PISA:         0.25 cm    Systemic VTI:  0.16 m MR PISA Eff ROA: 2 mm       Systemic Diam: 2.10 cm MR PISA Radius:  0.20 cm MV E velocity: 116.00 cm/s MV A velocity: 79.80 cm/s MV E/A ratio:  1.45 Kristeen Miss MD Electronically signed by Kristeen Miss MD Signature Date/Time: 12/09/2020/9:08:26 AM    Final     Assessment/Plan:   Principal Problem:   Acute exacerbation of CHF (congestive heart failure) (HCC) Active Problems:   OSA (obstructive sleep apnea)   Diabetes mellitus (HCC)   Pulmonary sarcoidosis (HCC)   Stage 3b chronic kidney disease (HCC)   Patient Summary: Charles Krueger is a 65 y.o. with a pertinent PMH of pulmonary sarcoidosis, type 2 diabetes, COPD, OSA, Paget's disease, alcohol use disorder who presented with shortness of breath and was admitted for acute hypoxic respiratory failure and significant volume overload  Acute on chronic heart failure with reduced ejection fraction LVEF 30 to 35%, moderately decreased function.  No regional wall abnormalities.  RV wall motion was poorly visualized but did note some reduced contractility of the mid RV and base. RV moderately enlarged.  Estimated right ventricular systolic pressure 38 mmHg.  On last echocardiogram patient had EF of 50 to 55% with right ventricular systolic pressure over 60 mmHg.  Troponins of 15, 16 and EKG changes of flat T waves in the lateral leads, but no other ST wave changes noted. While patient may not be having an acute ischemic event, he does have a history of CAD putting him at risk for ischemic cardiomyopathy.  Patient also at risk for infiltrative disease in setting of sarcoidosis.  Other potential etiologies are alcohol use disorder or his CKD.  Patient is quite hypervolemic with +4 pitting edema, will need IV diuretics and monitoring of his electrolytes as well as monitoring for progression of his CKD.  Do not believe inotropes are  needed at this time.  We will attempt to obtain further records from the [place] [facility] [place]  in determining possible etiologies for acute exacerbation of heart failure.  Of note, cardiologist reading echocardiogram note McConnell sign, and he did note consideration of pulmonary emboli.  At this time I believe patient has other etiologies for acute reduction of RV contractility; left-sided heart failure, history of sarcoidosis and COPD causing increased pulmonary pressures. -IV Lasix 40 mg twice daily -Strict ins and outs and daily weights -Continue carvedilol 12.5 mg twice daily -Start losartan 25 mg daily -Daily BMP, magnesium levels -Unna boots -[facility]  CKD stage IIITexasa Nephrotic range proteinuria Uncertain baseline creatinine and GFR, however on 06/13/20, patient's Cr was 1.6, GFR of 53. His protein to creatinine ratio at this time was 2.  Protein to creatinine ratio during this admission of 3.  Creatinine of 1.70, GFR of 45.  Overall, patient may be baseline, difficult to assess with his acute heart failure exacerbation.  We will continue to monitor his creatinine and GFR as we diurese and decrease the amount of renal congestion.  The proteinuria is in nephrotic range, and this may also be contributing to patient's anasarca. Worsening of kidney function with diuresis, patient may need nephrology consult during his admission.  [facility], patient's hepatitis C and B were negative, ANA negative, SPEP and UPEP negative for M spike, ANCA negative.  C3 179, C4 47. -Start losartan 25 mg daily -Continue diuresing as long as no further progression of his CKD. -Daily BMP  EtOH use disorder Continue CIWA without Ativan.  Patient not showing signs or symptoms of withdrawal.  History of pulmonary sarcoidosis Patient with history of pulmonary sarcoidosis, follows up with pulmonology at the [facility].  Of note patient did have an increase in absolute eosinophil count on admission.   We will continue to monitor and try to attain her the records from the [place].  Overall suspect this may be contributing to patient's increase in pulmonary pressures and right-sided heart strain.  History of COPD Uncertain of patient's Gold stage class, will restart home inhalers. -Continue Atrovent, Proventil, Incruse Ellipta, Brovana  History of OSA -We will order CPAP  Diet: Heart Healthy IVF: None,None VTE: Enoxaparin Code: Full PT/OT recs: Pending, none. Family Update: Patient's wife at bedside   Dispo: Anticipated discharge to Home pending further evaluation and work-up of his  Thalia Bloodgood DO Internal Medicine Resident PGY-1 Pager [phone] Please contact the on call pager after 5 pm and on weekends at [phone].

## 2020-12-09 NOTE — Progress Notes (Signed)
Patient evaluated at bedside, resting in bed comfortably.  I updated him on his echocardiogram findings that he has a worsening ejection fraction.  Patient was quite tearful upon hearing this information and stated that we will continue to investigate for the underlying etiology and continue to diurese him.  I agreed with him that this was in fact the plan.  He had no other questions and did not feel like discussing the matter further.

## 2020-12-09 NOTE — Progress Notes (Signed)
Notified by CCMD & monitoring strip saved of 20+ beat of vtach, per primary RN Ebra, pt asleep & asymptomatic. BP 158/86, HR 80's. On call provider Dr. Cyndie Chime notified via [facility] page. Per returned call from provider- no new orders. Will continue to monitor

## 2020-12-09 NOTE — Progress Notes (Signed)
*  PRELIMINARY RESULTS* Echocardiogram 2D Echocardiogram has been performed.  Neomia Dear RDCS 12/09/2020, 8:23 AM

## 2020-12-10 LAB — GLUCOSE, CAPILLARY
Glucose-Capillary: 117 mg/dL — ABNORMAL HIGH (ref 70–99)
Glucose-Capillary: 169 mg/dL — ABNORMAL HIGH (ref 70–99)
Glucose-Capillary: 177 mg/dL — ABNORMAL HIGH (ref 70–99)
Glucose-Capillary: 106 mg/dL — ABNORMAL HIGH (ref 70–99)
Glucose-Capillary: 181 mg/dL — ABNORMAL HIGH (ref 70–99)

## 2020-12-10 LAB — CBC
HCT: 33.8 % — ABNORMAL LOW (ref 39.0–52.0)
Hemoglobin: 9.6 g/dL — ABNORMAL LOW (ref 13.0–17.0)
MCH: 23.5 pg — ABNORMAL LOW (ref 26.0–34.0)
MCHC: 28.4 g/dL — ABNORMAL LOW (ref 30.0–36.0)
MCV: 82.6 fL (ref 80.0–100.0)
Platelets: 218 10*3/uL (ref 150–400)
RBC: 4.09 MIL/uL — ABNORMAL LOW (ref 4.22–5.81)
RDW: 17.8 % — ABNORMAL HIGH (ref 11.5–15.5)
WBC: 6.8 10*3/uL (ref 4.0–10.5)
nRBC: 0 % (ref 0.0–0.2)

## 2020-12-10 LAB — BASIC METABOLIC PANEL
Anion gap: 6 (ref 5–15)
BUN: 28 mg/dL — ABNORMAL HIGH (ref 8–23)
CO2: 25 mmol/L (ref 22–32)
Calcium: 9.4 mg/dL (ref 8.9–10.3)
Chloride: 108 mmol/L (ref 98–111)
Creatinine, Ser: 1.7 mg/dL — ABNORMAL HIGH (ref 0.61–1.24)
GFR, Estimated: 45 mL/min — ABNORMAL LOW (ref >60)
Glucose, Bld: 139 mg/dL — ABNORMAL HIGH (ref 70–99)
Potassium: 4 mmol/L (ref 3.5–5.1)
Sodium: 139 mmol/L (ref 135–145)

## 2020-12-10 LAB — MAGNESIUM: Magnesium: 1.8 mg/dL (ref 1.7–2.4)

## 2020-12-10 LAB — TSH: TSH: 1.457 u[IU]/mL (ref 0.350–4.500)

## 2020-12-10 MED ORDER — FUROSEMIDE 10 MG/ML IJ SOLN
60.0000 mg | Freq: Two times a day (BID) | INTRAMUSCULAR | Status: DC
  Administered 2020-12-10 – 2020-12-12 (×5): 60 mg via INTRAVENOUS
  Filled 2020-12-10 (×8): qty 6

## 2020-12-10 MED ORDER — SODIUM CHLORIDE 0.9 % IV SOLN
510.0000 mg | Freq: Once | INTRAVENOUS | Status: AC
  Administered 2020-12-10: 510 mg via INTRAVENOUS
  Filled 2020-12-10: qty 17

## 2020-12-10 MED ORDER — MAGNESIUM SULFATE IN D5W 1-5 GM/100ML-% IV SOLN
1.0000 g | Freq: Once | INTRAVENOUS | Status: AC
  Administered 2020-12-10: 1 g via INTRAVENOUS
  Filled 2020-12-10: qty 100

## 2020-12-10 NOTE — Progress Notes (Addendum)
HD#2 Subjective:  Overnight Events: No acute events overnight  Mr. Charles Krueger states that he slept well last night.  He continues to have shortness of breath and lower extremity swelling but feels like it is getting better slowly.    We discussed again his echo results from yesterday and he feels like he still needs more time to absorb the information.  We discussed extensively the implications of heart failure and next steps, including need for additional work-up to find the etiology.  We discussed that the priority at this time is to work on diuresis.  He notes that his cardiologist, Dr. Senaida Ores, at the [facility] has never mentioned possible cardiac sarcoidosis, however his pulmonologist did mention it, he believes.  He has never had a cath in the past.  Objective:  Vital signs in last 24 hours: Vitals:   12/09/20 1927 12/09/20 2317 12/10/20 0436 12/10/20 0437  BP:  (!) 157/92 (!) 158/88   Pulse:  76 78   Resp:  17 20   Temp:  (!) 97.5 F (36.4 C) 98.6 F (37 C)   TempSrc:  Oral Oral   SpO2: 94% 94% 93%   Weight:    101.7 kg  Height:       Supplemental O2: Nasal Cannula SpO2: 93 % O2 Flow Rate (L/min): 3 L/min  Physical Exam:   Constitutional: No acute distress, comfortable appearing HENT: normocephalic atraumatic Eyes: conjunctiva non-erythematous Neck: supple Cardiovascular: regular rate and rhythm, no m/r/g. + JVD. +4 pitting edema up to his waist. Pulmonary/Chest: Normal work of breathing on 3 L nasal cannula, crackles to the left lung base, right-sided lung fields with decreased breath sounds Abdominal: soft, non-tender, distended MSK: normal bulk and tone Neurological: alert & oriented x 3 Skin: Extremities are warm Psych: Normal mood and thought process  Filed Weights   12/08/20 2342 12/09/20 0353 12/10/20 0437  Weight: 102 kg 101.4 kg 101.7 kg     Intake/Output Summary (Last 24 hours) at 12/10/2020 0826 Last data filed at 12/09/2020 2300 Gross per 24 hour  Intake  240 ml  Output 1775 ml  Net -1535 ml    Net IO Since Admission: -2,570 mL [12/10/20 0826]  Pertinent Labs: CBC Latest Ref Rng & Units 12/10/2020 12/09/2020 12/08/2020  WBC 4.0 - 10.5 K/uL 6.8 6.9 7.1  Hemoglobin 13.0 - 17.0 g/dL 5.4(Y) 10.5(L) 10.0(L)  Hematocrit 39.0 - 52.0 % 33.8(L) 36.6(L) 36.0(L)  Platelets 150 - 400 K/uL 218 294 276    CMP Latest Ref Rng & Units 12/10/2020 12/09/2020 12/08/2020  Glucose 70 - 99 mg/dL 503(T) 95 97  BUN 8 - 23 mg/dL 46(F) 68(L) 27(N)  Creatinine 0.61 - 1.24 mg/dL 1.70(Y) 1.74(B) 4.49(Q)  Sodium 135 - 145 mmol/L 139 138 139  Potassium 3.5 - 5.1 mmol/L 4.0 4.1 4.5  Chloride 98 - 111 mmol/L 108 106 110  CO2 22 - 32 mmol/L 25 24 22   Calcium 8.9 - 10.3 mg/dL 9.4 9.7 9.6  Total Protein 6.5 - 8.1 g/dL - 7.6 7.1  Total Bilirubin 0.3 - 1.2 mg/dL - 0.6 1.1  Alkaline Phos 38 - 126 U/L - 350(H) 348(H)  AST 15 - 41 U/L - 17 14(L)  ALT 0 - 44 U/L - 18 18    Imaging: No results found.  Assessment/Plan:   Principal Problem:   Acute exacerbation of CHF (congestive heart failure) (HCC) Active Problems:   OSA (obstructive sleep apnea)   Diabetes mellitus (HCC)   Pulmonary sarcoidosis (HCC)   Stage  3b chronic kidney disease [facility])   Patient Summary: Charles Krueger is a 65 y.o. with a pertinent PMH of pulmonary sarcoidosis, type 2 diabetes, COPD, OSA, Paget's disease, alcohol use disorder who presented with shortness of breath and was admitted for acute hypoxic respiratory failure and significant volume overload  Acute on chronic heart failure with reduced ejection fraction Echo obtained during this admission shows LVEF 30 to 35% with moderately decreased function. EF is markedly decreased since his last echo approximately 2 months ago at which time he had EF of 50 to 55%. Troponins are flat without EKG changes so low suspicion for an acute ischemic event, although patient does have a history of CAD and is certainly at risk for acute coronary events. Patient  also at risk for infiltrative disease in setting of pulmonary sarcoidosis. Other potential etiologies are alcohol use disorder versus CKD.  Of note, RV abnormalities in the setting of pulmonary sarcoidosis and COPD.  In the past 24 hours, patient diuresed approximately 1.7 L however his weight has increased ever so slightly.  Due to this we will uptitrate his Lasix  -Increase Lasix to 60 mg IV twice daily -Strict ins and outs and daily weights -Continue carvedilol 12.5 mg twice daily -Continue losartan 25 mg daily -Daily BMP, magnesium levels -Unna boots -Obtain records from [place]  CKD stage IIIa Nephrotic range proteinuria Per chart review, baseline creatinine around 1.5-1.6 with previous proteinuria noted in the past up to approximately 2.2 g.  [facility], nephrology has begun a work-up for his proteinuria that includes hepatitis C and B negative, ANA negative, SPEP and UPEP negative for M spike, ANCA negative. C3 179, C4 47.  His renal function has remained stable during diuresis.  -Continue losartan 25 mg daily -Monitor renal function closely while diuresing -Daily BMP  History of pulmonary sarcoidosis Patient with history of pulmonary sarcoidosis, follows up with pul[facility]ology at the [facility].  Of note patient did have an increase in absolute eosinophil count on admission.  We will continue to monitor and try to attain her the records from the [place].  Overall suspect this may be contributing to patient's increase in pulmonary pressures and right-sided heart strain.  Iron Deficiency Anemia Hemoglobin is stable at this time, however iron panel on admission consistent with severe IDA. Per chart review, patient has a hx of polyps with last colonoscopy in March 2022. Will need to follow up with outpatient GI.  -Will consider IV iron given benefit in the setting of HFrEF  History of COPD Uncertain of patient's Gold stage class, will restart home inhalers.  -Continue  Atrovent, Proventil, Incruse Ellipta, Brovana  History of OSA -Continue to encourage nightly CPAP  Hx of EtOH use disorder -Patient not showing signs or symptoms of withdrawal.  Diet: Heart Healthy IVF: None,None VTE: Enoxaparin Code: Full PT/OT recs: Pending, none. Family Update: Patient's wife at bedside  Dispo: Anticipated discharge to Home pending further evaluation and work-up of his  Dr. Verdene Lennert Internal Medicine PGY-2  Pager: [phone] After 5pm on weekdays and 1pm on weekends: On Call pager [phone]  12/10/2020, 8:26 AM

## 2020-12-10 NOTE — Progress Notes (Signed)
Orthopedic Tech Progress Note Patient Details:  Charles Krueger 03-28-1956 [id number]  Ortho Devices Type of Ortho Device: Radio broadcast assistant Ortho Device/Splint Location: Bilateral Ortho Device/Splint Interventions: Ordered, Application   Post Interventions Patient Tolerated: Well  [name] 12/10/2020, 5:10 PM

## 2020-12-11 DIAGNOSIS — I5023 Acute on chronic systolic (congestive) heart failure: Secondary | ICD-10-CM

## 2020-12-11 LAB — BASIC METABOLIC PANEL
Anion gap: 9 (ref 5–15)
BUN: 27 mg/dL — ABNORMAL HIGH (ref 8–23)
CO2: 25 mmol/L (ref 22–32)
Calcium: 10 mg/dL (ref 8.9–10.3)
Chloride: 104 mmol/L (ref 98–111)
Creatinine, Ser: 1.65 mg/dL — ABNORMAL HIGH (ref 0.61–1.24)
GFR, Estimated: 46 mL/min — ABNORMAL LOW (ref >60)
Glucose, Bld: 109 mg/dL — ABNORMAL HIGH (ref 70–99)
Potassium: 3.9 mmol/L (ref 3.5–5.1)
Sodium: 138 mmol/L (ref 135–145)

## 2020-12-11 LAB — CBC WITH DIFFERENTIAL/PLATELET
Abs Immature Granulocytes: 0.05 10*3/uL (ref 0.00–0.07)
Basophils Absolute: 0 10*3/uL (ref 0.0–0.1)
Basophils Relative: 1 %
Eosinophils Absolute: 0.4 10*3/uL (ref 0.0–0.5)
Eosinophils Relative: 6 %
HCT: 33.4 % — ABNORMAL LOW (ref 39.0–52.0)
Hemoglobin: 9.5 g/dL — ABNORMAL LOW (ref 13.0–17.0)
Immature Granulocytes: 1 %
Lymphocytes Relative: 25 %
Lymphs Abs: 1.6 10*3/uL (ref 0.7–4.0)
MCH: 23.1 pg — ABNORMAL LOW (ref 26.0–34.0)
MCHC: 28.4 g/dL — ABNORMAL LOW (ref 30.0–36.0)
MCV: 81.3 fL (ref 80.0–100.0)
Monocytes Absolute: 0.5 10*3/uL (ref 0.1–1.0)
Monocytes Relative: 9 %
Neutro Abs: 3.8 10*3/uL (ref 1.7–7.7)
Neutrophils Relative %: 58 %
Platelets: 239 10*3/uL (ref 150–400)
RBC: 4.11 MIL/uL — ABNORMAL LOW (ref 4.22–5.81)
RDW: 18 % — ABNORMAL HIGH (ref 11.5–15.5)
WBC: 6.4 10*3/uL (ref 4.0–10.5)
nRBC: 0 % (ref 0.0–0.2)

## 2020-12-11 LAB — GLUCOSE, CAPILLARY
Glucose-Capillary: 146 mg/dL — ABNORMAL HIGH (ref 70–99)
Glucose-Capillary: 135 mg/dL — ABNORMAL HIGH (ref 70–99)
Glucose-Capillary: 136 mg/dL — ABNORMAL HIGH (ref 70–99)
Glucose-Capillary: 175 mg/dL — ABNORMAL HIGH (ref 70–99)

## 2020-12-11 LAB — MAGNESIUM: Magnesium: 1.9 mg/dL (ref 1.7–2.4)

## 2020-12-11 NOTE — Progress Notes (Signed)
HD#3 Subjective:   Overnight Events: No acute events overnight  This morning, Mr. Fei has no acute complaints at this time, although he finds the Unna boots uncomfortable.  He states he is willing to wear them for now and understands that they will help with diuresis.  He states that his breathing is unchanged and still not quite to his baseline.  He denies any chest pain or palpitations.  Overall he feels like his volume overload is improving.  Objective:  Vital signs in last 24 hours: Vitals:   12/10/20 1925 12/10/20 1929 12/10/20 2100 12/11/20 0500  BP:   (!) 142/69 (!) 161/86  Pulse:  75 66 80  Resp:   18 20  Temp:   (!) 97.5 F (36.4 C) 98.5 F (36.9 C)  TempSrc:   Axillary Oral  SpO2: (!) 89% 91% 94% 94%  Weight:    100.5 kg  Height:       Supplemental O2: Nasal Cannula SpO2: 94 % O2 Flow Rate (L/min): 2 L/min FiO2 (%): 28 %  Physical Exam:   Constitutional: No acute distress, comfortable appearing HENT: normocephalic atraumatic Eyes: conjunctiva non-erythematous Neck: supple Cardiovascular: regular rate and rhythm, no m/r/g. + JVD. +3 pitting edema up to his waist. Pulmonary/Chest: Normal work of breathing on 3 L nasal cannula, fine crackles bilaterally throughout all lung fields, but less so in the upper bilateral lung fields. Abdominal: soft, non-tender, distended MSK: normal bulk and tone.  Unna boots in place bilaterally Neurological: alert & oriented x 3 Skin: Extremities are warm Psych: Normal mood and thought process  Filed Weights   12/09/20 0353 12/10/20 0437 12/11/20 0500  Weight: 101.4 kg 101.7 kg 100.5 kg    Intake/Output Summary (Last 24 hours) at 12/11/2020 0651 Last data filed at 12/11/2020 0640 Gross per 24 hour  Intake 1540 ml  Output 4050 ml  Net -2510 ml    Net IO Since Admission: -5,080 mL [12/11/20 0651]  Pertinent Labs: CBC Latest Ref Rng & Units 12/11/2020 12/10/2020 12/09/2020  WBC 4.0 - 10.5 K/uL 6.4 6.8 6.9  Hemoglobin 13.0  - 17.0 g/dL 0.7(M) 2.2(Q) 10.5(L)  Hematocrit 39.0 - 52.0 % 33.4(L) 33.8(L) 36.6(L)  Platelets 150 - 400 K/uL 239 218 294    CMP Latest Ref Rng & Units 12/11/2020 12/10/2020 12/09/2020  Glucose 70 - 99 mg/dL 333(L) 456(Y) 95  BUN 8 - 23 mg/dL 56(L) 89(H) 73(S)  Creatinine 0.61 - 1.24 mg/dL 2.87(G) 8.11(X) 7.26(O)  Sodium 135 - 145 mmol/L 138 139 138  Potassium 3.5 - 5.1 mmol/L 3.9 4.0 4.1  Chloride 98 - 111 mmol/L 104 108 106  CO2 22 - 32 mmol/L 25 25 24   Calcium 8.9 - 10.3 mg/dL 9.4 9.7  Total Protein 6.5 - 8.1 g/dL - - 7.6  Total Bilirubin 0.3 - 1.2 mg/dL - - 0.6  Alkaline Phos 38 - 126 U/L - - 350(H)  AST 15 - 41 U/L - - 17  ALT 0 - 44 U/L - - 18   Imaging: No results found.  Assessment/Plan:   Principal Problem:   Acute exacerbation of CHF (congestive heart failure) (HCC) Active Problems:   OSA (obstructive sleep apnea)   Diabetes mellitus (HCC)   Pulmonary sarcoidosis (HCC)   Stage 3b chronic kidney disease (HCC)   Patient Summary: Charles Krueger is a 65 y.o. with a pertinent PMH of pulmonary sarcoidosis, type 2 diabetes, COPD, OSA, Paget's disease, alcohol use disorder who presented with shortness of breath and was  admitted for acute hypoxic respiratory failure and significant volume overload  Acute on chronic heart failure with reduced ejection fraction Echo obtained during this admission shows LVEF 30 to 35% with moderately decreased function. EF is markedly decreased since his last echo approximately 2 months ago at which time he had EF of 50 to 55%. Troponins are flat without EKG changes so low suspicion for an acute ischemic event, although patient does have a history of CAD and is certainly at risk for acute coronary events. Patient also at risk for infiltrative disease in setting of pulmonary sarcoidosis. Other potential etiologies are alcohol use disorder versus CKD.  Of note, RV abnormalities in the setting of pulmonary sarcoidosis and COPD.  Diuresis has  improved dramatically with increased dose of Lasix, approximately 4 L out in the last 24 hours.  Patient is down 1.2 kg in the past 24 hours, a total of 4.7 kg since admission 3 days ago.  -Continue Lasix to 60 mg IV twice daily -Strict ins and outs and daily weights -Continue carvedilol 12.5 mg twice daily. Consider increasing dose to 25 mg twice daily tomorrow given that was his home dose -Continue losartan 25 mg daily -Daily BMP, magnesium levels -Unna boots -Patient will likely require a cardiology consult once further diuresed to evaluate for ischemic etiology via LHC as well as measurement of pulmonary pressures with RHC  CKD stage IIIa Nephrotic range proteinuria Per chart review, baseline creatinine around 1.5-1.6 with previous proteinuria noted in the past up to approximately 2.2 g.  [facility], nephrology has begun a work-up for his proteinuria that includes hepatitis C and B negative, ANA negative, SPEP and UPEP negative for M spike, ANCA negative. C3 179, C4 47.  Stable at this time.  -Continue losartan 25 mg daily.  Will consider increasing to 50 mg daily. -Monitor renal function closely while diuresing -Daily BMP  History of pulmonary sarcoidosis Patient with history of pulmonary sarcoidosis, follows up with pulmonology at the [facility].  Of note patient did have an increase in absolute eosinophil count on admission.  We will continue to monitor and try to attain her the records from the [place].  Overall suspect this may be contributing to patient's increase in pulmonary pressures and right-sided heart strain.  Iron Deficiency Anemia Hemoglobin is stable at this time, however iron panel on admission consistent with severe IDA. Per chart review, patient has a hx of polyps with last colonoscopy in March 2022. Will need to follow up with outpatient GI.  He received 1 dose of Feraheme on 6/18.  No further intervention at this time.  History of COPD Stable at this  time there is no evidence of wheezing on examination.  -Continue Atrovent, Proventil, Incruse Ellipta, Brovana  History of OSA -Continue to encourage nightly CPAP  Hx of EtOH use disorder -Patient not showing signs or symptoms of withdrawal.  Diet: Heart Healthy IVF: None,None VTE: Enoxaparin Code: Full  PT/OT recs: Pending.  Dispo: Anticipated discharge to Home pending further evaluation and work-up of heart failure.  Dr. Verdene Lennert Internal Medicine PGY-2  Pager: [phone] After 5pm on weekdays and 1pm on weekends: On Call pager [phone]  12/11/2020, 6:51 AM

## 2020-12-12 LAB — BASIC METABOLIC PANEL
Anion gap: 10 (ref 5–15)
BUN: 23 mg/dL (ref 8–23)
CO2: 28 mmol/L (ref 22–32)
Calcium: 9.8 mg/dL (ref 8.9–10.3)
Chloride: 100 mmol/L (ref 98–111)
Creatinine, Ser: 1.61 mg/dL — ABNORMAL HIGH (ref 0.61–1.24)
GFR, Estimated: 48 mL/min — ABNORMAL LOW (ref >60)
Glucose, Bld: 112 mg/dL — ABNORMAL HIGH (ref 70–99)
Potassium: 3.6 mmol/L (ref 3.5–5.1)
Sodium: 138 mmol/L (ref 135–145)

## 2020-12-12 LAB — CBC WITH DIFFERENTIAL/PLATELET
Abs Immature Granulocytes: 0.03 10*3/uL (ref 0.00–0.07)
Basophils Absolute: 0 10*3/uL (ref 0.0–0.1)
Basophils Relative: 1 %
Eosinophils Absolute: 0.7 10*3/uL — ABNORMAL HIGH (ref 0.0–0.5)
Eosinophils Relative: 11 %
HCT: 34.3 % — ABNORMAL LOW (ref 39.0–52.0)
Hemoglobin: 9.6 g/dL — ABNORMAL LOW (ref 13.0–17.0)
Immature Granulocytes: 1 %
Lymphocytes Relative: 29 %
Lymphs Abs: 1.9 10*3/uL (ref 0.7–4.0)
MCH: 23.1 pg — ABNORMAL LOW (ref 26.0–34.0)
MCHC: 28 g/dL — ABNORMAL LOW (ref 30.0–36.0)
MCV: 82.5 fL (ref 80.0–100.0)
Monocytes Absolute: 0.8 10*3/uL (ref 0.1–1.0)
Monocytes Relative: 12 %
Neutro Abs: 3.1 10*3/uL (ref 1.7–7.7)
Neutrophils Relative %: 46 %
Platelets: 232 10*3/uL (ref 150–400)
RBC: 4.16 MIL/uL — ABNORMAL LOW (ref 4.22–5.81)
RDW: 18.5 % — ABNORMAL HIGH (ref 11.5–15.5)
WBC: 6.5 10*3/uL (ref 4.0–10.5)
nRBC: 0.3 % — ABNORMAL HIGH (ref 0.0–0.2)

## 2020-12-12 LAB — GLUCOSE, CAPILLARY
Glucose-Capillary: 118 mg/dL — ABNORMAL HIGH (ref 70–99)
Glucose-Capillary: 194 mg/dL — ABNORMAL HIGH (ref 70–99)
Glucose-Capillary: 159 mg/dL — ABNORMAL HIGH (ref 70–99)

## 2020-12-12 LAB — MAGNESIUM: Magnesium: 1.8 mg/dL (ref 1.7–2.4)

## 2020-12-12 LAB — HIV ANTIBODY (ROUTINE TESTING W REFLEX): HIV Screen 4th Generation wRfx: NONREACTIVE

## 2020-12-12 MED ORDER — LOSARTAN POTASSIUM 50 MG PO TABS
50.0000 mg | ORAL_TABLET | Freq: Every day | ORAL | Status: DC
  Administered 2020-12-13: 50 mg via ORAL
  Filled 2020-12-12: qty 1

## 2020-12-12 MED ORDER — FUROSEMIDE 10 MG/ML IJ SOLN
80.0000 mg | Freq: Two times a day (BID) | INTRAMUSCULAR | Status: DC
  Administered 2020-12-12 – 2020-12-14 (×4): 80 mg via INTRAVENOUS
  Filled 2020-12-12 (×4): qty 8

## 2020-12-12 MED ORDER — POTASSIUM CHLORIDE CRYS ER 20 MEQ PO TBCR
40.0000 meq | EXTENDED_RELEASE_TABLET | Freq: Two times a day (BID) | ORAL | Status: AC
  Administered 2020-12-12 (×2): 40 meq via ORAL
  Filled 2020-12-12 (×2): qty 2

## 2020-12-12 NOTE — Progress Notes (Signed)
PT Cancellation Note  Patient Details Name: Charles Krueger MRN: [id number] DOB: Apr 04, 1956   Cancelled Treatment:    Reason Eval/Treat Not Completed: Patient declined, no reason specified.  "Not today.Marland KitchenMarland KitchenI'm tired." 12/12/2020  Jacinto Halim., PT Acute Rehabilitation Services [phone]  (pager) [phone]  (office)   Eliseo Gum [name] 12/12/2020, 4:14 PM

## 2020-12-12 NOTE — Progress Notes (Signed)
Heart Failure Stewardship Pharmacist Progress Note   PCP: Pcp, No PCP-Cardiologist: None    HPI:  65 yo M with PMH of pulmonary sarcoidosis, diabetes, COPD, and OSA. He [facility] with shortness of breath, LE edema, and abdominal distention. CXR with findings of mild to moderate congestive heart failure. An ECHO was done on 6/17 and found to have LVEF 30-35% and mildly reduced RV systolic function. Prior ECHO showed LVEF 50-55%.  Current HF Medications: Furosemide 60 mg IV BID Carvedilol 12.5 mg BID Losartan 50 mg daily  Prior to admission HF Medications: Carvedilol 25 mg BID Losartan 100 mg daily (with HCTZ 12.5 mg daily)  Pertinent Lab Values: Serum creatinine 1.61, BUN 23, Potassium 3.6, Sodium 138, BNP 1213, Magnesium 1.8  Vital Signs: Weight: 217 lbs (admission weight: 224 lbs) Blood pressure: 160/80s Heart rate: 70s   Medication Assistance / Insurance Benefits Check: Does the patient have prescription insurance?  Yes Type of insurance plan: [facility] + [facility]  Outpatient Pharmacy:  Prior to admission outpatient pharmacy: [facility] Is the patient willing to use [facility] TOC pharmacy at discharge? Yes Is the patient willing to transition their outpatient pharmacy to utilize a [facility] outpatient pharmacy?   No    Assessment: 1. Acute on chronic systolic CHF (EF 56-25%), due to unknown etiology. Pending cardiology consult for possible ischemic evaluation. NYHA class III symptoms. - Continue furosemide 60 mg IV BID - Continue carvedilol 12.5 mg BID - Continue losartan 50 mg daily. Consider optimizing to Entresto 49/51 mg BID for further BP reduction and optimization for HFrEF. - Consider starting spironolactone 25 mg daily prior to discharge - Consider starting Jardiance 10 mg daily prior to discharge   Plan: 1) Medication changes recommended at this time: - Stop losartan and start Entresto 49/51 mg BID  2) Patient assistance: - [facility]  ins[facility]: both [place] and [place] on [facility] formulary - HF TOC appt made for 7/1  3)  Education  - To be completed prior to discharge  Sharen Hones, PharmD, BCPS Heart Failure Engineer, building services Phone [phone]

## 2020-12-12 NOTE — Progress Notes (Signed)
HD#4 Subjective:  Overnight Events: None  Charles Krueger is standing in room after walking around room.  He denies feeling short of breath when walking throughout his room.  He endorses frequent urination.  Overall, he states he feels good without specific discomfort or pain and notes that his swelling has decreased. He has been able to sleep without difficulty. No difficulty with bowel movements.  Patient was offered to follow-up with his cardiologist on an outpatient basis or be evaluated by [facility] cardiology here, patient states while he is here he would like to be evaluated by [facility] cardiology team.  Prior to admission, he states he could walk up two flights of stairs without difficulty or SOB. He notes that many years ago, he had to wear oxygen "for a similar situation." He believes it was in 2007 or 2008.   Patient would like [place] to call his daughter, Charles Krueger, who is a pediatric resident.  Objective:  Vital signs in last 24 hours: Vitals:   12/11/20 1351 12/11/20 2025 12/11/20 2031 12/12/20 0458  BP: (!) 144/80 (!) 169/85  (!) 156/83  Pulse:  76  76  Resp: $Remo'18 18  15  'PXuVR$ Temp: 98.4 F (36.9 C) 98.4 F (36.9 C)  98.4 F (36.9 C)  TempSrc: Oral Oral  Oral  SpO2:  95% 95% 96%  Weight:    98.6 kg  Height:       Supplemental O2: Nasal Cannula SpO2: 96 % O2 Flow Rate (L/min): 2 L/min FiO2 (%): 28 %   Physical Exam:  Constitutional: Resting comfortably, no acute distress HENT: normocephalic atraumatic Eyes: conjunctiva non-erythematous Neck: supple Cardiovascular: regular rate and rhythm, no m/r/g. 3-4+ pitting edema of lower extremities up to hip Pulmonary/Chest: normal work of breathing on 2 L nasal cannula, lungs clear to auscultation bilaterally Abdominal: soft, non-tender, non-distended MSK: normal bulk and tone Neurological: alert & oriented x 3 Skin: warm and dry.  Unna boots in place.  Psych: Normal mood and thought process  Filed Weights   12/10/20 0437 12/11/20  0500 12/12/20 0458  Weight: 101.7 kg 100.5 kg 98.6 kg     Intake/Output Summary (Last 24 hours) at 12/12/2020 0610 Last data filed at 12/12/2020 0015 Gross per 24 hour  Intake 723 ml  Output 3100 ml  Net -2377 ml   Net IO Since Admission: -7,307 mL [12/12/20 0610]  Pertinent Labs: CBC Latest Ref Rng & Units 12/12/2020 12/11/2020 12/10/2020  WBC 4.0 - 10.5 K/uL 6.5 6.4 6.8  Hemoglobin 13.0 - 17.0 g/dL 9.6(L) 9.5(L) 9.6(L)  Hematocrit 39.0 - 52.0 % 34.3(L) 33.4(L) 33.8(L)  Platelets 150 - 400 K/uL 232 239 218    CMP Latest Ref Rng & Units 12/11/2020 12/10/2020 12/09/2020  Glucose 70 - 99 mg/dL 109(H) 139(H) 95  BUN 8 - 23 mg/dL 27(H) 28(H) 25(H)  Creatinine 0.61 - 1.24 mg/dL 1.65(H) 1.70(H) 1.70(H)  Sodium 135 - 145 mmol/L 138 139 138  Potassium 3.5 - 5.1 mmol/L 3.9 4.0 4.1  Chloride 98 - 111 mmol/L 104 108 106  CO2 22 - 32 mmol/L $RemoveB'25 25 24  'nJWdghBI$ Calcium 8.9 - 10.3 mg/dL 10.0 9.4 9.7  Total Protein 6.5 - 8.1 g/dL - - 7.6  Total Bilirubin 0.3 - 1.2 mg/dL - - 0.6  Alkaline Phos 38 - 126 U/L - - 350(H)  AST 15 - 41 U/L - - 17  ALT 0 - 44 U/L - - 18    Imaging: No results found.  Assessment/Plan:   Principal Problem:  Acute exacerbation of CHF (congestive heart failure) (HCC) Active Problems:   OSA (obstructive sleep apnea)   Diabetes mellitus ([place])   Pulmonary sarcoidosis (HCC)   Stage 3b chronic kidney disease ([place])  Patient Summary: Charles Krueger is a 65 y.o. with a pertinent PMH of pulmonary sarcoidosis, type 2 diabetes, COPD, OSA, Paget's disease, alcohol use disorder who presented with shortness of breath and was admitted for acute hypoxic respiratory failure and significant volume overload  Acute on chronic heart failure with reduced ejection fraction Echo obtained during this admission shows LVEF 30 to 35% with moderately decreased function. EF is markedly decreased since his last echo approximately 2 months ago at which time he had EF of 50 to 55%.  Patient does not appear  to be having an acute ischemic event upon admission.  Per chart review patient with left heart cath in 2010 with an EF of 45% with global hypokinesis.  His marginal arteries and RCA were found to be 60 to 70% stenosed but nothing was amenable to PCI.  Patient continues to have adequate diuresis, -2 L of the past 24 hours, -7 L since admission.  Uncertain of his dry weight but has lost 3 to 4 kg since admission.  Once diuresed, will plan to consult cardiology for further work-up of his worsening heart failure.  Patient still requiring supplemental oxygen, will wean as tolerable and plan for PT OT evaluation prior to discharge. -Continue Lasix to 60 mg IV twice daily -Strict ins and outs and daily weights -Increase carvedilol to 25 mg twice daily, his home dose -Continue losartan 25 mg daily -Daily BMP, magnesium levels. 40 meq potassium chloride ordered -Consult cardiology once diuresed more -PT/OT consult placed   CKD stage IIIa Nephrotic syndrome Per chart review, baseline creatinine around 1.5-1.6 with previous proteinuria noted in the past up to approximately 2.2 g.  Upon admission total protein over 3 g, patient with hypoalbuminemia and edema, meeting criteria for nephrotic syndrome.  Do not believe he needs anticoagulation per KDIGO guidelines, albumin is 2.6.  He has had significant work-up with his nephrologist at the [facility] for multiple myeloma, reversible causes such as hepatitis B and C and autoimmune etiologies.  None have been positive, making his diabetes and hypertension more likely the cause of his nephrotic syndrome.  We will also place order for antiphospholipid A2.  He will need outpatient follow-up with his nephrologist with possible kidney biopsy.  Creatinine stable and at baseline at this time. -Continue losartan 25 mg daily.  Consider increasing to 50 mg tomorrow -HIV pending -Anti-PLA2 ordered -Monitor renal function closely while diuresing -Daily BMP   History of COPD Stable at  this time, without evidence of wheezing on examination. -Continue Atrovent, Proventil, Incruse Ellipta, Brovana   History of OSA -Continue to encourage nightly CPAP   Hx of EtOH use disorder -Patient not showing signs or symptoms of withdrawal.  Diet: Heart Healthy IVF: None,None VTE: Enoxaparin Code: Full TOC recs: None at this time Family Update: Plan to call patient's daughter, Charles Krueger this afternoon  Dispo: Anticipated discharge to Home in 3 to 4 days pending further diuresis with IV medications and cardiology work-up  [facility] Resident PGY-1 Pager [phone] Please contact the on call pager after 5 pm and on weekends at [phone].

## 2020-12-13 DIAGNOSIS — D860 Sarcoidosis of lung: Secondary | ICD-10-CM

## 2020-12-13 DIAGNOSIS — N1832 Chronic kidney disease, stage 3b: Secondary | ICD-10-CM

## 2020-12-13 DIAGNOSIS — I5043 Acute on chronic combined systolic (congestive) and diastolic (congestive) heart failure: Secondary | ICD-10-CM

## 2020-12-13 LAB — BASIC METABOLIC PANEL
Anion gap: 6 (ref 5–15)
BUN: 22 mg/dL (ref 8–23)
CO2: 29 mmol/L (ref 22–32)
Calcium: 9.8 mg/dL (ref 8.9–10.3)
Chloride: 102 mmol/L (ref 98–111)
Creatinine, Ser: 1.64 mg/dL — ABNORMAL HIGH (ref 0.61–1.24)
GFR, Estimated: 47 mL/min — ABNORMAL LOW (ref >60)
Glucose, Bld: 169 mg/dL — ABNORMAL HIGH (ref 70–99)
Potassium: 4.1 mmol/L (ref 3.5–5.1)
Sodium: 137 mmol/L (ref 135–145)

## 2020-12-13 LAB — GLUCOSE, CAPILLARY
Glucose-Capillary: 163 mg/dL — ABNORMAL HIGH (ref 70–99)
Glucose-Capillary: 115 mg/dL — ABNORMAL HIGH (ref 70–99)
Glucose-Capillary: 157 mg/dL — ABNORMAL HIGH (ref 70–99)
Glucose-Capillary: 136 mg/dL — ABNORMAL HIGH (ref 70–99)

## 2020-12-13 LAB — MAGNESIUM: Magnesium: 1.7 mg/dL (ref 1.7–2.4)

## 2020-12-13 MED ORDER — CARVEDILOL 25 MG PO TABS
25.0000 mg | ORAL_TABLET | Freq: Two times a day (BID) | ORAL | Status: DC
  Administered 2020-12-13 – 2020-12-17 (×10): 25 mg via ORAL
  Filled 2020-12-13 (×2): qty 1
  Filled 2020-12-13: qty 2
  Filled 2020-12-13 (×5): qty 1
  Filled 2020-12-13: qty 2
  Filled 2020-12-13 (×2): qty 1

## 2020-12-13 MED ORDER — SODIUM CHLORIDE 0.9 % IV SOLN: INTRAVENOUS | Status: DC

## 2020-12-13 MED ORDER — MAGNESIUM SULFATE 2 GM/50ML IV SOLN
2.0000 g | Freq: Once | INTRAVENOUS | Status: AC
  Administered 2020-12-13: 2 g via INTRAVENOUS
  Filled 2020-12-13: qty 50

## 2020-12-13 MED ORDER — SODIUM CHLORIDE 0.9% FLUSH
3.0000 mL | Freq: Two times a day (BID) | INTRAVENOUS | Status: DC
  Administered 2020-12-13 – 2020-12-14 (×2): 3 mL via INTRAVENOUS

## 2020-12-13 MED ORDER — SODIUM CHLORIDE 0.9% FLUSH: 3.0000 mL | INTRAVENOUS | Status: DC | PRN

## 2020-12-13 MED ORDER — ASPIRIN 81 MG PO CHEW
81.0000 mg | CHEWABLE_TABLET | Freq: Every day | ORAL | Status: DC
  Administered 2020-12-13 – 2020-12-17 (×4): 81 mg via ORAL
  Filled 2020-12-13 (×5): qty 1

## 2020-12-13 MED ORDER — SODIUM CHLORIDE 0.9 % IV SOLN: 250.0000 mL | INTRAVENOUS | Status: DC | PRN

## 2020-12-13 MED ORDER — ASPIRIN 81 MG PO CHEW
81.0000 mg | CHEWABLE_TABLET | ORAL | Status: AC
  Administered 2020-12-14: 81 mg via ORAL

## 2020-12-13 NOTE — Progress Notes (Signed)
Pt reused to use urinal. Educated pt on the importance of keeping track of UOP as pt is on IV lasix

## 2020-12-13 NOTE — Plan of Care (Signed)
  Shared Decision Making/Informed Consent  The risks [stroke (1 in 1000), death (1 in 1000), kidney failure [usually temporary] (1 in 500), bleeding (1 in 200), allergic reaction [possibly serious] (1 in 200)], benefits (diagnostic support and management of coronary artery disease) and alternatives of a cardiac catheterization were discussed in detail with Charles Krueger and he is willing to proceed.  NPO after midnight, right and left cardiac cath scheduled tomorrow 12/14/20 at 2:30 PM. HOLD losartan tomorrow. Trend BMP daily.

## 2020-12-13 NOTE — H&P (View-Only) (Signed)
Cardiology Consultation:   Patient ID: Charles Krueger MRN: [id number]; DOB: 08-01-1955  Admit date: 12/08/2020 Date of Consult: 12/13/2020  PCP:  Oneita Hurt No   [facility] Providers Cardiologist:  [facility] Dr[facility] Senaida Ores  Patient Profile:   Charles Krueger is a 65 y.o. male with a hx of pulmonary sarcoidosis, type 2 diabetes, non-oxygen dependent COPD, OSA, EtOH abuse, history of pancreatitis with pseudocyst and abscess, gout, adrenal adenoma, hypertension, hyperlipidemia,CAD without prior intervention, CKD stage III, cirrhosis, who is being seen 12/13/2020 for the evaluation of CHF at the request of Dr Austin Miles.  History of Present Illness:   Charles Krueger follows cardiology Dr. Senaida Ores at [facility]. He has known coronary artery disease, no stent or CABG in the past. He is not a great historian due to limited knowledge of his medical conditions.   Patient reports new onset of SOB with exertion started 3 - 4 month ago, initially presented to [facility] hospital in April 2022 and was found to be acutely hypoxic due to presumed new onset heart failure.  He felt communication was lacking and decided to leave the hospital before discharge.  He presented to [facility] on 12/08/2020 for progressively worsening shortness of breath with exertion, lower extremity edema up to bilateral thighs, abdominal bloating, activity intolerance, orthopnea, PND.  He was on HCTZ at the time for HTN, not taking  any loop diuretics historically. He states he followed up with [facility] cardiology last year, he was told everything is stable. He states he does not recall having a heart cath in 2010, denied history of stent placement or CABG. He states he is being monitored by pulmonology at [facility] for his sarcoidosis with yearly lung testing and is unaware of other organ involvement. He continue to smokes daily with various amount of cigarettes. He quit heavy ETOH use in 2010, did drink heavily for >30 years, currently still drinks 1-2 shots of liquor 4-5 times  weekly.  He denied ever having any chest pain, chest pressure, syncope, dizziness in the past.   Admission diagnostic including CXR revealed mild to moderate pulmonary edema.  BNP was1213.  CMP revealed elevated creatinine 1.72, albumin 2.6, elevated alkaline phos 348, and GFR 44.  Hs troponin negative x2. EKG revealed sinus rhythm with PAC,ventricular rate of 97 bpm, T wave flattening of V3-V6.  He was admitted for hypoxic respiratory failure due to pulmonary edema to family medicine.  He was concerned for new onset of CHF versus nephrotic syndrome, started on IV Lasix 40 mg initially.   Echocardiogram repeated on 12/09/20 which showed LVEF 30 to 35%, no RWMA, diastolic parameter indeterminate, RV wall motion limited review, contractility of apex with reduced contractility of mid RV and base, RV systolic function mildly reduced, RV size moderately enlarged, mild elevated PASP, RVSP 38.26mmHg, RA mildly dilated, mild MR, mild to moderate TR, mild AS.    Per records review (reported per family medicine, no paper records in chart), previous echo from 10/13/2020 had LVEF 50 to 55%, moderate diastolic dysfunction, mild LA dilation, mild MR, mild TR, RVSP elevated at 60 mmHg. He had left heart catheterization in 2010 which revealed EF 45% with global hypokinesis, marginal arteries and RCA 60 to 70% stenosis but nothing amendable to PCI.   During his course, his IV Lasix was increased to 80 twice daily with adequate response to diuresis.  He was continued on carvedilol 25 twice daily and losartan.  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 Cardiology is consulted today to further assist CHF management.     Past Medical History:  Diagnosis Date   Diabetes mellitus  without complication (HCC)    HTN (hypertension)    OSA on CPAP     History reviewed. No pertinent surgical history.   Home Medications:  Prior to Admission medications   Medication Sig Start Date End Date Taking? Authorizing Provider  albuterol (PROAIR HFA) 108 (90 Base) MCG/ACT inhaler Inhale 2 puffs into the lungs every 6 (six) hours as needed for wheezing or shortness of breath.   Yes [provider]  allopurinol (ZYLOPRIM) 100 MG tablet Take 200 mg by mouth daily.   Yes [provider]  amLODipine (NORVASC) 10 MG tablet Take 10 mg by mouth daily.   Yes [provider]  aspirin 81 MG tablet Take 162 mg by mouth at bedtime.   Yes [provider]  atorvastatin (LIPITOR) 20 MG tablet Take 20 mg by mouth at bedtime.   Yes [provider]  carvedilol (COREG) 25 MG tablet Take 25 mg by mouth 2 (two) times daily with a meal.   Yes [provider]  fluticasone (FLONASE) 50 MCG/ACT nasal spray Place 2 sprays into both nostrils daily.   Yes [provider]  ipratropium (ATROVENT) 0.03 % nasal spray Place 2 sprays into both nostrils daily.   Yes [provider]  ketotifen (ZADITOR) 0.025 % ophthalmic solution Place 1 drop into both eyes every morning.   Yes [provider]  losartan-hydrochlorothiazide (HYZAAR) 100-12.5 MG tablet Take 1 tablet by mouth daily.   Yes [provider]  metFORMIN (GLUCOPHAGE) 1000 MG tablet Take 1,000 mg by mouth 2 (two) times daily with a meal.   Yes [provider]  Multiple Vitamin (MULTIVITAMIN WITH MINERALS) TABS tablet Take 1 tablet by mouth daily.   Yes [provider]  Omega 3 1200 MG CAPS Take 1,200 mg by mouth daily.   Yes [provider]  Tiotropium Bromide-Olodaterol (STIOLTO RESPIMAT) 2.5-2.5 MCG/ACT AERS Inhale 2 puffs into the lungs daily.   Yes [provider]    Inpatient Medications: Scheduled Meds:  arformoterol  15 mcg  Nebulization BID   And   umeclidinium bromide  1 puff Inhalation Daily   aspirin  81 mg Oral Daily   atorvastatin  20 mg Oral Daily   carvedilol  25 mg Oral BID WC   enoxaparin (LOVENOX) injection  40 mg Subcutaneous Q24H   furosemide  80 mg Intravenous BID   insulin aspart  0-9 Units Subcutaneous TID WC   ipratropium  1 spray Each Nare Daily   losartan  50 mg Oral Daily   nitroGLYCERIN  1 inch Topical Q6H   sodium chloride flush  3 mL Intravenous Q12H   Continuous Infusions:  magnesium sulfate bolus IVPB     PRN Meds: acetaminophen **OR** acetaminophen, albuterol  Allergies:    Allergies  Allergen Reactions   Lisinopril Other (See Comments)    Acute renal failure    Social History:   Social History   Socioeconomic History   Marital status: Married    Spouse name: Not on file   Number of children: Not on file   Years of education: Not on file   Highest education level: Not on file  Occupational History   Not on file  Tobacco Use   Smoking status: Some Days    Pack years: 0.00    Types: Cigars    Start date: 2002   Smokeless tobacco: Current   Tobacco comments:    3-4 Cigars per week  Substance and Sexual Activity   Alcohol use: Yes    Alcohol/week: 6.0 - 7.0 standard drinks    Types: 6 - 7 Shots of liquor per week    Comment: 6-7 shots per week   Drug use: No   Sexual activity: Not on file  Other Topics Concern   Not on file  Social History Narrative   Not on file   Social Determinants of Health   Financial Resource Strain: Not on file  Food Insecurity: Not on file  Transportation Needs: Not on file  Physical Activity: Not on file  Stress: Not on file  Social Connections: Not on file  Intimate Partner Violence: Not on file    Family History:   Heart disease   ROS:  Constitutional: see HPI  Eyes: Denied vision change or loss Ears/Nose/Mouth/Throat: Denied ear ache, sore throat, coughing, sinus pain Cardiovascular: see HPI  Respiratory: see  HPI  Gastrointestinal: Denied nausea, vomiting, abdominal pain, diarrhea Genital/Urinary: increased urination and urinary frequency Musculoskeletal: Denied muscle ache, joint pain, weakness Skin: Denied rash, wound Neuro: Denied headache, dizziness, syncope Psych: Denied history of depression/anxiety  Endocrine: history of diabetes   Physical Exam/Data:   Vitals:   12/12/20 1932 12/13/20 0535 12/13/20 0812 12/13/20 1052  BP:  (!) 154/85  (!) 152/79  Pulse:      Resp:    17  Temp:  98.6 F (37 C)  98.4 F (36.9 C)  TempSrc:  Oral    SpO2: 92% 95% 92% 93%  Weight:  97.1 kg    Height:        Intake/Output Summary (Last 24 hours) at 12/13/2020 1331 Last data filed at 12/13/2020 0836 Gross per 24 hour  Intake 480 ml  Output --  Net 480 ml   Last 3 Weights 12/13/2020 12/12/2020 12/11/2020  Weight (lbs) 214 lb 217 lb 4.8 oz 221 lb 9.6 oz  Weight (kg) 97.07 kg 98.567 kg 100.517 kg     Body mass index is 32.54 kg/m.   Vitals:  Vitals:   12/13/20 0812 12/13/20 1052  BP:  (!) 152/79  Pulse:    Resp:  17  Temp:  98.4 F (36.9 C)  SpO2: 92% 93%   General Appearance: In no apparent distress, sitting in chair  HEENT: Normocephalic, atraumatic. EOMs intact.  Neck: Supple, trachea midline, JVD 8-9CM while sitting at 90 degree  Cardiovascular: Regular rate and rhythm, normal S1-S2, no murmur/rub/gallop Respiratory: Resting breathing unlabored, lungs sounds with basilar rales on auscultation bilaterally, no use of accessory muscles. On 2L [place].   Gastrointestinal: Bowel sounds positive, abdomen soft, non-tender, non-distended.  Extremities: Able to move all extremities without difficulty, 1+ pitting BLE edema Genitourinary: Genital exam not performed Musculoskeletal: Normal muscle bulk and tone, muscle strength 5/5 throughout, no limited range of motion Skin: Intact, warm, dry. No rashes or petechiae noted in exposed areas.  Neurologic: Alert, oriented to person, place and time.  Fluent speech, no cognitive deficit, no gross focal neuro deficit Psychiatric: Normal affect. Mood is appropriate.    EKG:  The EKG was personally reviewed and demonstrates:  Sinus rhythm with PACs, non-specific flattening of T waves in V3-6  Telemetry:  Telemetry was personally reviewed and demonstrates:  Sinus rhythm with occasional PACs  or PVCs    Relevant CV Studies:  Echo on 12/09/20:   1. Left ventricular ejection fraction, by estimation, is 30 to 35%. The  left ventricle has moderately decreased function. The left ventricle has  no regional wall motion abnormalities. Left ventricular diastolic  parameters are indeterminate.   2. The RV wall motion is not seen as well as we would like but there is  contractility of the apex with reduced contractility of the mid RV and  base ( McConnels Sign)      Consider evaluation for pulmonary embolus is clinically indicated.      . Right ventricular systolic function is mildly reduced. The right  ventricular size is moderately enlarged. There is mildly elevated  pulmonary artery systolic pressure. The estimated right ventricular  systolic pressure is 38.8 mmHg.   3. Right atrial size was mildly dilated.   4. The mitral valve is grossly normal. Mild mitral valve regurgitation.   5. Tricuspid valve regurgitation is mild to moderate.   6. The aortic valve is grossly normal. Aortic valve regurgitation is not  visualized. Mild aortic valve stenosis.   Per H&P on 12/08/20:  Echocardiogram (10/13/2020) - Moderate diastolic dysfunction and elevated LA pressure - LVEF of 50-55% - Mild LA dilated - Mild mitral valve regurgitation - Mild TV regurgitation - Rt ventricle systolic pressure elevated > 60 mm Hg   CT Chest wo contrast (10/13/2020) - Mild groundglass and reticular opacities in the perihilar regions and lung bases. These may be due to some early edema, less likely atypical infection.  - Small right pleural effusion with adjacent  atelectasis.  - Cardiac lead with calcific coronary disease.  - Marked mediastinal and to lesser extent hilar adenopathy. This may be due to a benign etiology such as sarcoidosis or due to lymphoma/metastatic disease.  - Enlarged, slightly irregular contour of the liver which could reflect cirrhosis.  - Partially imaged indeterminate left adrenal nodule.  - 7 mm nodule in the lingula. Follow-up as per below.    Laboratory Data:  High Sensitivity Troponin:   Recent Labs  Lab 12/08/20 1453 12/08/20 1653  TROPONINIHS 16 15     Chemistry Recent Labs  Lab 12/11/20 0250 12/12/20 0450 12/13/20 0218  NA 138 138 137  K 3.9 3.6 4.1  CL 104 100 102  CO2 25 28 29   GLUCOSE 109* 112* 169*  BUN 27* 23 22  CREATININE 1.65* 1.61* 1.64*  CALCIUM 10.0 9.8 9.8  GFRNONAA 46* 48* 47*  ANIONGAP 9 10 6     Recent Labs  Lab 12/08/20 1453 12/09/20 0010  PROT 7.1 7.6  ALBUMIN 2.6* 2.6*  AST 14* 17  ALT 18 18  ALKPHOS 348* 350*  BILITOT 1.1 0.6   Hematology Recent Labs  Lab 12/10/20 0258 12/11/20 0250 12/12/20 0450  WBC 6.8 6.4 6.5  RBC 4.09* 4.11* 4.16*  HGB 9.6* 9.5* 9.6*  HCT 33.8* 33.4* 34.3*  MCV 82.6 81.3 82.5  MCH 23.5* 23.1* 23.1*  MCHC 28.4* 28.4* 28.0*  RDW 17.8* 18.0* 18.5*  PLT 218 239 232   BNP Recent Labs  Lab 12/08/20 1453  BNP 1,213.0*    DDimer No results for input(s): DDIMER in the last 168 hours.   Radiology/Studies:  No results found.   Assessment and Plan:   Acute systolic and diastolic heart failure, new onset - Presented with worsening shortness of breath, orthopnea, PND, leg edema 3 to 74-month ago - BNP 1213 POA - CXR with pulmonary edema POA -  Hs Trop negative x2 - Echo 12/09/20 showed EF 35-35%, no RWMA, diastolic parameter indeterminate, RV wall motion limited review, contractility of apex with reduced contractility of mid RV and base, RV systolic function mildly reduced, RV size moderately enlarged, mild elevated PASP, RVSP 38.8mmHg, RA  mildly dilated, mild MR, mild to moderate TR, mild AS.  (EF was 50-55% with diastolic dysfunction on 10/13/20 Echo) - [facility] EF 45% with global hypokinesis, marginal arteries and RCA 60 to 70% stenosis but nothing amendable to PCI.  - Net -6.5L since admission, weight is down from 232 to 214 pounds  - clinically hypervolemic, continue IV Lasix 80mg BID until euvolemic  - query cardiac sarcoidosis versus ischemic CM versus ETOH induced CM   - GDMT: continue Coreg 25mg BID, recommend transition Losartan 50mg daily to Entresto 49/51 BID, if renal function and BP tolerating, start spironolactone 12.5mg daily, SGLT2i can be added outpatient or before discharge  - consider right and left heart cath to evaluate underlying etiology and adequacy of diuresis, will defer final decision to MD and the patient upon further discussion  CAD - per records review, patient had calcific coronary artery on CT 09/2020, left heart cath from 2010 with marginal arteries and RCA 60 to 70% stenosis but nothing amendable to PCI (he does not recall any LHC in the past)  - Hs trop negative x2 - EKG with non-specific changes - given acutely worsened EF, consider right and left cardiac cath  - continue medical therapy with coreg, transition losartan to entresto, would resume ASA if no contraindication   CKD stage IIIa Nephrotic syndrome  Hypoalbuminemia - renal index is stable near baseline with diuresis, non-oliguric, trend daily BMP  - workup for etiology of nephrotic syndrome per [facility], potential biopsy   Cirrhosis  - INR 1.2 , PLT 232k, alka phos elevated, anasarca improving  - Abd ultrasound 12/08/20 with cirrhosis, small ascites  - query hepatic sarcoidosis versus ETOH induced cirrhosis   COPD Pulmonary sarcoidosis  OSA Type 2 DM ETOH use  - managed per family med      Risk Assessment/Risk Scores:        New York Heart Association (NYHA) Functional Class NYHA Class III         For questions or updates, please contact [facility] Please consult [email or web address] for contact info under    Signed, Xika Zhao, NP  12/13/2020 1:31 PM   Patient seen and examined. Agree with assessment and plan. Charles Krueger is a very pleasant 64-year-old gentleman who has a history of pulmonary sarcoidosis, type 2 diabetes mellitus, COPD, obstructive sleep apnea, and known CAD who had undergone prior cardiac catheterization in 2010 for which medical therapy was recommended circumflex marginal and RCA stenosis.  He had previously been evaluated at [facility] in April 2020 and was found to be acutely hypoxic felt to be due to new onset heart failure.  [facility]  An echo Doppler study 2022 reportedly showed an EF of 50 to 55% with moderate diastolic dysfunction, mild MR mild TR and moderately severe pulmonary hypertension with PA pressure at 60 mm.  His most recent echo Doppler study on December 09, 2020 since his admission here now shows further reduction of LV function with EF of 30 to 35%, mild RV function reduction and dilated RV with estimated PA pressure at 38.8 mm.  There is mild to moderate tricuspid regurgitation, mild aortic stenosis, mild MR.  Patient   states he has had tense lower extremity bilateral edema.  His edema seems to initiate in his lower abdomen leading to subsequent scrotal edema and bilateral leg swelling.  His leg edema has been fairly tense but this has improved with diuresis.  Needs admission net urine output is -6.5 L with weight reduced from 2 32-2 14.  He has been on IV Lasix 80 mg twice a day.  Initial creatinine was 1.72 which has slightly improved to 1.64 today.  On exam he is in no acute distress.  JVD approximately 8 cm.  Lungs were clear without wheezing.  Rhythm was regular 1/6 systolic murmur.  There was no S3 gallop.  Abdomen was mildly protuberant but nontender.  He continued to have 1-2+ tense lower extremity edema to above the  knees.  Neurologic exam is grossly nonfocal.  He has normal affect and mood.  After much discussion with the patient, I have recommended definitive evaluation with right and left heart cardiac catheterization and make certain there is not an ischemic component to his worsening heart function.  His edema may also be contributed by his presumed nephrotic syndrome with low oncotic pressure intravascularly contributing to leg swelling.  With his renal insufficiency, we will hold losartan in anticipation of possible right and left heart catheterization tomorrow.  Ultimately, would favor transition to [facility] depending upon renal function.I have reviewed the risks, indications, and alternatives to cardiac catheterization, possible angioplasty, and stenting with the patient. Risks include but are not limited to bleeding, infection, vascular injury, stroke, myocardial infection, arrhythmia, kidney injury, radiation-related injury in the case of prolonged fluoroscopy use, emergency cardiac surgery, and death. The patient understands the risks of serious complication is 1-2 in 1000 with diagnostic cardiac cath and 1-2% or less with angioplasty/stenting.  Tentatively set up for right left heart catheterization tomorrow.  Lennette Bihari, MD, [facility] 12/13/2020 3:02 PM

## 2020-12-13 NOTE — Progress Notes (Addendum)
HD#5 Subjective:  Overnight Events: None  [name] is sitting in the recliner resting comfortably.  Notes feeling all right this morning.  States he took off his [place] boots last night.  He has been walking throughout the room and has felt unsteady at times.  Denies any difficulties with urination.  Denies any episodes of chest pain or palpitations.  Updated him on the plan to have cardiology come by and see him today.  Patient agreeable to plan.  When asked about his history of sarcoidosis patient states that he does not know much about it.  States that he has been on prednisone in the past but is not sure when or how long he was on it.  No questions or concerns at this time.  Objective:  Vital signs in last 24 hours: Vitals:   12/12/20 1346 12/12/20 1615 12/12/20 1932 12/13/20 0535  BP: (!) 143/81 121/75  (!) 154/85  Pulse:  78    Resp: 16 16    Temp: 98.5 F (36.9 C) 98.5 F (36.9 C)  98.6 F (37 C)  TempSrc: Oral Oral  Oral  SpO2:  100% 92% 95%  Weight:    97.1 kg  Height:       Supplemental O2: Nasal Cannula SpO2: 95 % O2 Flow Rate (L/min): 2 L/min FiO2 (%): 28 %  Physical Exam:  Constitutional: Resting comfortably, no acute distress HENT: normocephalic atraumatic Eyes: conjunctiva non-erythematous Neck: supple Cardiovascular: regular rate and rhythm, no m/r/g. 3 pitting edema of lower extremities  Pulmonary/Chest: normal work of breathing on 2 L nasal cannula, lungs clear to auscultation bilaterally MSK: normal bulk and tone Neurological: alert & oriented x 3 Skin: warm and dry.  Psych: Normal mood and thought process  Filed Weights   12/11/20 0500 12/12/20 0458 12/13/20 0535  Weight: 100.5 kg 98.6 kg 97.1 kg     Intake/Output Summary (Last 24 hours) at 12/13/2020 0731 Last data filed at 12/12/2020 1900 Gross per 24 hour  Intake 480 ml  Output --  Net 480 ml    Net IO Since Admission: -6,827 mL [12/13/20 0731]  Pertinent Labs: CBC Latest Ref Rng & Units  12/12/2020 12/11/2020 12/10/2020  WBC 4.0 - 10.5 K/uL 6.5 6.4 6.8  Hemoglobin 13.0 - 17.0 g/dL 9.6(L) 9.5(L) 9.6(L)  Hematocrit 39.0 - 52.0 % 34.3(L) 33.4(L) 33.8(L)  Platelets 150 - 400 K/uL 232 239 218    CMP Latest Ref Rng & Units 12/13/2020 12/12/2020 12/11/2020  Glucose 70 - 99 mg/dL 169(H) 112(H) 109(H)  BUN 8 - 23 mg/dL 22 23 27(H)  Creatinine 0.61 - 1.24 mg/dL 1.64(H) 1.61(H) 1.65(H)  Sodium 135 - 145 mmol/L 137 138 138  Potassium 3.5 - 5.1 mmol/L 4.1 3.6 3.9  Chloride 98 - 111 mmol/L 102 100 104  CO2 22 - 32 mmol/L $RemoveB'29 28 25  'ElezkfsO$ Calcium 8.9 - 10.3 mg/dL 9.8 9.8 10.0  Total Protein 6.5 - 8.1 g/dL - - -  Total Bilirubin 0.3 - 1.2 mg/dL - - -  Alkaline Phos 38 - 126 U/L - - -  AST 15 - 41 U/L - - -  ALT 0 - 44 U/L - - -    Imaging: No results found.  Telemetry: Telemetry reviewed, no episodes of nonsustained V. tach or other arrhythmias noted.  Assessment/Plan:   Principal Problem:   Acute exacerbation of CHF (congestive heart failure) (HCC) Active Problems:   OSA (obstructive sleep apnea)   Diabetes mellitus ([place])   Pulmonary sarcoidosis ([place])  Stage 3b chronic kidney disease [facility])  Patient Summary: Charles Krueger is a 65 y.o. with a pertinent PMH of pulmonary sarcoidosis, type 2 diabetes, COPD, OSA, Paget's disease, alcohol use disorder who presented with shortness of breath and was admitted for acute hypoxic respiratory failure and significant volume overload  Acute on chronic heart failure with reduced ejection fraction Echo obtained during this admission shows LVEF 30 to 35% with moderately decreased function. Hx left heart cath in 2010 with an EF of 45% with global hypokinesis.  His marginal arteries and RCA were found to be 60 to 70% stenosed but nothing was amenable to PCI.  Increased lasix to 80 mg IV BID today, patient continues to diurese well. Weight is down 5 kg since admission. Plan to consult cardiology today for their assistance and further ischemic work up. With  patient's new HFrEF and hypertension, believe he would benefit from [place]. Will plan to start entresto 100 today and DC losartan.  -Cardiology consult today -Lasix to 80 mg IV twice daily -Strict ins and outs and daily weights -Continue carvedilol 25 mg BID -Plan to start Entresto 100, DC losatan tomororw -Daily BMP, magnesium levels. K improved to 4.1.  2 g mag phos IV given today -Cardiology consult placed -PT/OT consult placed   CKD stage IIIa Nephrotic syndrome Per chart review, baseline creatinine around 1.5-1.6 with previous proteinuria noted in the past up to approximately 2.2 g.  Upon admission total protein over 3 g, patient with hypoalbuminemia and edema, meeting criteria for nephrotic syndrome.  Do not believe he needs anticoagulation per KDIGO guidelines, albumin is 2.6. Patient also with hematuria, will need further outpatient workup for potential glomerulonephritis. Cr stable at this time, will continue to monitor and hope his renal function will continue to be this way once entresto is started.   He has had significant work-up with his nephrologist at the [facility] for multiple myeloma, reversible causes such as hepatitis B and C and autoimmune etiologies.  None have been positive, making his diabetes and hypertension more likely the cause of his nephrotic syndrome.  We will also place order for antiphospholipid A2.  He will need outpatient follow-up with his nephrologist with possible kidney biopsy.  Creatinine stable and at baseline at this time. -Transition to entresto today -HIV negative -Anti-PLA2 ordered -Monitor renal function closely while diuresing -Daily BMP   History of COPD Stable at this time, without evidence of wheezing on examination. -Continue Atrovent, Proventil, Incruse Ellipta, Brovana   History of OSA -Continue to encourage nightly CPAP   Hx of EtOH use disorder -Patient not showing signs or symptoms of withdrawal.  Diet: Heart Healthy IVF:  None,None VTE: Enoxaparin Code: Full TOC recs: None at this time Family Update: Updated patient's daughter, Dr. Selinda Flavin yesterday  Dispo: Anticipated discharge to Home in 2 to 3 days pending further diuresis with IV medications and cardiology work-up  [place] [facility] Resident PGY-1 Pager [phone] Please contact the on call pager after 5 pm and on weekends at [phone].

## 2020-12-13 NOTE — Evaluation (Signed)
Physical Therapy Evaluation & Discharge Patient Details Name: Charles Krueger MRN: [id number] DOB: [birth year] Today's Date: 12/13/2020   History of Present Illness  Pt is a 65 y.o. male admitted 12/08/20 with SOB. Workup for acute hypoxic respiratory failure, significant volume overload secondary to CHF exacerbation. PMH includes pulmonary sarcoidosis, DM2, COPD, OSA, Paget's disease, ETOH use disorder.   Clinical Impression  Patient evaluated by Physical Therapy with no further acute PT needs identified. PTA, pt independent, working, lives with spouse. Today, pt independent with mobility and ADL tasks. Minimal DOE noted with activity, but difficulty getting reliable SpO2 reading during session (see below). Encouraged more frequent activity while admitted. All education has been completed and the patient has no further questions. Acute PT is signing off. Thank you for this referral.  SpO2 88% on 2L at rest, HR 70s SpO2 down to 86% on 3L with ambulation (*but HR on pulse ox not matching telemetry)     Follow Up Recommendations No PT follow up    Equipment Recommendations  None recommended by PT    Recommendations for Other Services       Precautions / Restrictions Precautions Precautions: Other (comment) Precaution Comments: Watch SpO2 - difficult getting reliable reading via finger Restrictions Weight Bearing Restrictions: No      Mobility  Bed Mobility               General bed mobility comments: Received sitting in recliner    Transfers Overall transfer level: Independent Equipment used: None                Ambulation/Gait Ambulation/Gait assistance: Independent Gait Distance (Feet): 450 Feet Assistive device: None Gait Pattern/deviations: WFL(Within Functional Limits)   Gait velocity interpretation: >2.62 ft/sec, indicative of community ambulatory    Stairs            Wheelchair Mobility    Modified Rankin (Stroke Patients Only)        Balance Overall balance assessment: No apparent balance deficits (not formally assessed)                                           Pertinent Vitals/Pain Pain Assessment: Faces Faces Pain Scale: Hurts a little bit Pain Location: bilateral knees/quads Pain Descriptors / Indicators: Tightness Pain Intervention(s): Monitored during session;Other (comment) (performed BLE stretches/ROM)    Home Living Family/patient expects to be discharged to:: Private residence Living Arrangements: Spouse/significant other Available Help at Discharge: Family;Available 24 hours/day Type of Home: House Home Access: Level entry     Home Layout: Two level;Bed/bath upstairs Home Equipment: None      Prior Function Level of Independence: Independent         Comments: Independent, works, drives; retired Electronics engineer (jumped out of planes)     Higher education careers adviser        Extremity/Trunk Assessment   Upper Extremity Assessment Upper Extremity Assessment: Overall WFL for tasks assessed    Lower Extremity Assessment Lower Extremity Assessment: Overall WFL for tasks assessed (strength WFL; bilateral knee flex limitations (still able to flex >90'))       Communication   Communication: No difficulties  Cognition Arousal/Alertness: Awake/alert Behavior During Therapy: WFL for tasks assessed/performed Overall Cognitive Status: Within Functional Limits for tasks assessed  General Comments General comments (skin integrity, edema, etc.): SpO2 88% on 2L at rest, HR 70s; SpO2 down to 86% on 3L with ambulation, unsure if reliable reading as pulse oximeter not matching telemonitor HR; difficulty getting reliable Spo2 reading. Pt able to demonstrate correct technique for switching between wall<>portable O2 to allow for hallway ambulation; RN aware    Exercises Other Exercises Other Exercises: Standing calf and hamstring stretch with BUE  support on bed; standing calf raises; pt with difficulty performing standing quad stretch due to ROM restrictions, educ on alternatives for sitting at edge of recliner and performing in sidelying   Assessment/Plan    PT Assessment Patent does not need any further PT services  PT Problem List         PT Treatment Interventions      PT Goals (Current goals can be found in the Care Plan section)  Acute Rehab PT Goals PT Goal Formulation: All assessment and education complete, DC therapy    Frequency     Barriers to discharge        Co-evaluation               AM-PAC PT "6 Clicks" Mobility  Outcome Measure Help needed turning from your back to your side while in a flat bed without using bedrails?: None Help needed moving from lying on your back to sitting on the side of a flat bed without using bedrails?: None Help needed moving to and from a bed to a chair (including a wheelchair)?: None Help needed standing up from a chair using your arms (e.g., wheelchair or bedside chair)?: None Help needed to walk in hospital room?: None Help needed climbing 3-5 steps with a railing? : None 6 Click Score: 24    End of Session Equipment Utilized During Treatment: Oxygen Activity Tolerance: Patient tolerated treatment well Patient left: in chair;with call bell/phone within reach Nurse Communication: Mobility status PT Visit Diagnosis: Other abnormalities of gait and mobility (R26.89)    Time: 6553-7482 PT Time Calculation (min) (ACUTE ONLY): 18 min   Charges:   PT Evaluation $PT Eval Low Complexity: 1 Low        Ina Homes, PT, DPT Acute Rehabilitation Services  Pager [phone] Office [phone]  Malachy Chamber 12/13/2020, 10:59 AM

## 2020-12-13 NOTE — Progress Notes (Signed)
OT Cancellation Note  Patient Details Name: Charles Krueger MRN: [id number] DOB: May 09, 1956   Cancelled Treatment:    Reason Eval/Treat Not Completed: OT screened, no needs identified, will sign off. Per PT note, pt appears to be at/near baseline with ADLs. Walked independently with PT.   Raynald Kemp, OT Acute Rehabilitation Services Pager: [phone] Office: [phone]  12/13/2020, 12:01 PM

## 2020-12-13 NOTE — Progress Notes (Signed)
Patient refused CPAP for tonight 

## 2020-12-13 NOTE — Plan of Care (Signed)
  Shared Decision Making/Informed Consent  The risks [stroke (1 in 1000), death (1 in 1000), kidney failure [usually temporary] (1 in 500), bleeding (1 in 200), allergic reaction [possibly serious] (1 in 200)], benefits (diagnostic support and management of coronary artery disease) and alternatives of a cardiac catheterization were discussed in detail with Mr. Sparacino and he is willing to proceed.  NPO after midnight, right and left cardiac cath scheduled tomorrow 12/14/20 at 2:30 PM. HOLD losartan tomorrow. Trend BMP daily. Transition to [facility] after cath.

## 2020-12-13 NOTE — Consult Note (Addendum)
Cardiology Consultation:   Patient ID: Charles Krueger MRN: [id number]; DOB: 08-01-1955  Admit date: 12/08/2020 Date of Consult: 12/13/2020  PCP:  Oneita Hurt No   [facility] Providers Cardiologist:  [facility] Dr[facility] Senaida Ores  Patient Profile:   Charles Krueger is a 65 y.o. male with a hx of pulmonary sarcoidosis, type 2 diabetes, non-oxygen dependent COPD, OSA, EtOH abuse, history of pancreatitis with pseudocyst and abscess, gout, adrenal adenoma, hypertension, hyperlipidemia,CAD without prior intervention, CKD stage III, cirrhosis, who is being seen 12/13/2020 for the evaluation of CHF at the request of Dr Austin Miles.  History of Present Illness:   Charles Krueger follows cardiology Dr. Senaida Ores at [facility]. He has known coronary artery disease, no stent or CABG in the past. He is not a great historian due to limited knowledge of his medical conditions.   Patient reports new onset of SOB with exertion started 3 - 4 month ago, initially presented to [facility] hospital in April 2022 and was found to be acutely hypoxic due to presumed new onset heart failure.  He felt communication was lacking and decided to leave the hospital before discharge.  He presented to [facility] on 12/08/2020 for progressively worsening shortness of breath with exertion, lower extremity edema up to bilateral thighs, abdominal bloating, activity intolerance, orthopnea, PND.  He was on HCTZ at the time for HTN, not taking  any loop diuretics historically. He states he followed up with [facility] cardiology last year, he was told everything is stable. He states he does not recall having a heart cath in 2010, denied history of stent placement or CABG. He states he is being monitored by pulmonology at [facility] for his sarcoidosis with yearly lung testing and is unaware of other organ involvement. He continue to smokes daily with various amount of cigarettes. He quit heavy ETOH use in 2010, did drink heavily for >30 years, currently still drinks 1-2 shots of liquor 4-5 times  weekly.  He denied ever having any chest pain, chest pressure, syncope, dizziness in the past.   Admission diagnostic including CXR revealed mild to moderate pulmonary edema.  BNP was1213.  CMP revealed elevated creatinine 1.72, albumin 2.6, elevated alkaline phos 348, and GFR 44.  Hs troponin negative x2. EKG revealed sinus rhythm with PAC,ventricular rate of 97 bpm, T wave flattening of V3-V6.  He was admitted for hypoxic respiratory failure due to pulmonary edema to family medicine.  He was concerned for new onset of CHF versus nephrotic syndrome, started on IV Lasix 40 mg initially.   Echocardiogram repeated on 12/09/20 which showed LVEF 30 to 35%, no RWMA, diastolic parameter indeterminate, RV wall motion limited review, contractility of apex with reduced contractility of mid RV and base, RV systolic function mildly reduced, RV size moderately enlarged, mild elevated PASP, RVSP 38.26mmHg, RA mildly dilated, mild MR, mild to moderate TR, mild AS.    Per records review (reported per family medicine, no paper records in chart), previous echo from 10/13/2020 had LVEF 50 to 55%, moderate diastolic dysfunction, mild LA dilation, mild MR, mild TR, RVSP elevated at 60 mmHg. He had left heart catheterization in 2010 which revealed EF 45% with global hypokinesis, marginal arteries and RCA 60 to 70% stenosis but nothing amendable to PCI.   During his course, his IV Lasix was increased to 80 twice daily with adequate response to diuresis.  He was continued on carvedilol 25 twice daily and losartan.  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 Cardiology is consulted today to further assist CHF management.     Past Medical History:  Diagnosis Date   Diabetes mellitus  without complication (HCC)    HTN (hypertension)    OSA on CPAP     History reviewed. No pertinent surgical history.   Home Medications:  Prior to Admission medications   Medication Sig Start Date End Date Taking? Authorizing Provider  albuterol (PROAIR HFA) 108 (90 Base) MCG/ACT inhaler Inhale 2 puffs into the lungs every 6 (six) hours as needed for wheezing or shortness of breath.   Yes [provider]  allopurinol (ZYLOPRIM) 100 MG tablet Take 200 mg by mouth daily.   Yes [provider]  amLODipine (NORVASC) 10 MG tablet Take 10 mg by mouth daily.   Yes [provider]  aspirin 81 MG tablet Take 162 mg by mouth at bedtime.   Yes [provider]  atorvastatin (LIPITOR) 20 MG tablet Take 20 mg by mouth at bedtime.   Yes [provider]  carvedilol (COREG) 25 MG tablet Take 25 mg by mouth 2 (two) times daily with a meal.   Yes [provider]  fluticasone (FLONASE) 50 MCG/ACT nasal spray Place 2 sprays into both nostrils daily.   Yes [provider]  ipratropium (ATROVENT) 0.03 % nasal spray Place 2 sprays into both nostrils daily.   Yes [provider]  ketotifen (ZADITOR) 0.025 % ophthalmic solution Place 1 drop into both eyes every morning.   Yes [provider]  losartan-hydrochlorothiazide (HYZAAR) 100-12.5 MG tablet Take 1 tablet by mouth daily.   Yes [provider]  metFORMIN (GLUCOPHAGE) 1000 MG tablet Take 1,000 mg by mouth 2 (two) times daily with a meal.   Yes [provider]  Multiple Vitamin (MULTIVITAMIN WITH MINERALS) TABS tablet Take 1 tablet by mouth daily.   Yes [provider]  Omega 3 1200 MG CAPS Take 1,200 mg by mouth daily.   Yes [provider]  Tiotropium Bromide-Olodaterol (STIOLTO RESPIMAT) 2.5-2.5 MCG/ACT AERS Inhale 2 puffs into the lungs daily.   Yes [provider]    Inpatient Medications: Scheduled Meds:  arformoterol  15 mcg  Nebulization BID   And   umeclidinium bromide  1 puff Inhalation Daily   aspirin  81 mg Oral Daily   atorvastatin  20 mg Oral Daily   carvedilol  25 mg Oral BID WC   enoxaparin (LOVENOX) injection  40 mg Subcutaneous Q24H   furosemide  80 mg Intravenous BID   insulin aspart  0-9 Units Subcutaneous TID WC   ipratropium  1 spray Each Nare Daily   losartan  50 mg Oral Daily   nitroGLYCERIN  1 inch Topical Q6H   sodium chloride flush  3 mL Intravenous Q12H   Continuous Infusions:  magnesium sulfate bolus IVPB     PRN Meds: acetaminophen **OR** acetaminophen, albuterol  Allergies:    Allergies  Allergen Reactions   Lisinopril Other (See Comments)    Acute renal failure    Social History:   Social History   Socioeconomic History   Marital status: Married    Spouse name: Not on file   Number of children: Not on file   Years of education: Not on file   Highest education level: Not on file  Occupational History   Not on file  Tobacco Use   Smoking status: Some Days    Pack years: 0.00    Types: Cigars    Start date: 2002   Smokeless tobacco: Current   Tobacco comments:    3-4 Cigars per week  Substance and Sexual Activity   Alcohol use: Yes    Alcohol/week: 6.0 - 7.0 standard drinks    Types: 6 - 7 Shots of liquor per week    Comment: 6-7 shots per week   Drug use: No   Sexual activity: Not on file  Other Topics Concern   Not on file  Social History Narrative   Not on file   Social Determinants of Health   Financial Resource Strain: Not on file  Food Insecurity: Not on file  Transportation Needs: Not on file  Physical Activity: Not on file  Stress: Not on file  Social Connections: Not on file  Intimate Partner Violence: Not on file    Family History:   Heart disease   ROS:  Constitutional: see HPI  Eyes: Denied vision change or loss Ears/Nose/Mouth/Throat: Denied ear ache, sore throat, coughing, sinus pain Cardiovascular: see HPI  Respiratory: see  HPI  Gastrointestinal: Denied nausea, vomiting, abdominal pain, diarrhea Genital/Urinary: increased urination and urinary frequency Musculoskeletal: Denied muscle ache, joint pain, weakness Skin: Denied rash, wound Neuro: Denied headache, dizziness, syncope Psych: Denied history of depression/anxiety  Endocrine: history of diabetes   Physical Exam/Data:   Vitals:   12/12/20 1932 12/13/20 0535 12/13/20 0812 12/13/20 1052  BP:  (!) 154/85  (!) 152/79  Pulse:      Resp:    17  Temp:  98.6 F (37 C)  98.4 F (36.9 C)  TempSrc:  Oral    SpO2: 92% 95% 92% 93%  Weight:  97.1 kg    Height:        Intake/Output Summary (Last 24 hours) at 12/13/2020 1331 Last data filed at 12/13/2020 0836 Gross per 24 hour  Intake 480 ml  Output --  Net 480 ml   Last 3 Weights 12/13/2020 12/12/2020 12/11/2020  Weight (lbs) 214 lb 217 lb 4.8 oz 221 lb 9.6 oz  Weight (kg) 97.07 kg 98.567 kg 100.517 kg     Body mass index is 32.54 kg/m.   Vitals:  Vitals:   12/13/20 0812 12/13/20 1052  BP:  (!) 152/79  Pulse:    Resp:  17  Temp:  98.4 F (36.9 C)  SpO2: 92% 93%   General Appearance: In no apparent distress, sitting in chair  HEENT: Normocephalic, atraumatic. EOMs intact.  Neck: Supple, trachea midline, JVD 8-9CM while sitting at 90 degree  Cardiovascular: Regular rate and rhythm, normal S1-S2, no murmur/rub/gallop Respiratory: Resting breathing unlabored, lungs sounds with basilar rales on auscultation bilaterally, no use of accessory muscles. On 2L [place].   Gastrointestinal: Bowel sounds positive, abdomen soft, non-tender, non-distended.  Extremities: Able to move all extremities without difficulty, 1+ pitting BLE edema Genitourinary: Genital exam not performed Musculoskeletal: Normal muscle bulk and tone, muscle strength 5/5 throughout, no limited range of motion Skin: Intact, warm, dry. No rashes or petechiae noted in exposed areas.  Neurologic: Alert, oriented to person, place and time.  Fluent speech, no cognitive deficit, no gross focal neuro deficit Psychiatric: Normal affect. Mood is appropriate.    EKG:  The EKG was personally reviewed and demonstrates:  Sinus rhythm with PACs, non-specific flattening of T waves in V3-6  Telemetry:  Telemetry was personally reviewed and demonstrates:  Sinus rhythm with occasional PACs  or PVCs    Relevant CV Studies:  Echo on 12/09/20:   1. Left ventricular ejection fraction, by estimation, is 30 to 35%. The  left ventricle has moderately decreased function. The left ventricle has  no regional wall motion abnormalities. Left ventricular diastolic  parameters are indeterminate.   2. The RV wall motion is not seen as well as we would like but there is  contractility of the apex with reduced contractility of the mid RV and  base ( McConnels Sign)      Consider evaluation for pulmonary embolus is clinically indicated.      . Right ventricular systolic function is mildly reduced. The right  ventricular size is moderately enlarged. There is mildly elevated  pulmonary artery systolic pressure. The estimated right ventricular  systolic pressure is 38.8 mmHg.   3. Right atrial size was mildly dilated.   4. The mitral valve is grossly normal. Mild mitral valve regurgitation.   5. Tricuspid valve regurgitation is mild to moderate.   6. The aortic valve is grossly normal. Aortic valve regurgitation is not  visualized. Mild aortic valve stenosis.   Per H&P on 12/08/20:  Echocardiogram (10/13/2020) - Moderate diastolic dysfunction and elevated LA pressure - LVEF of 50-55% - Mild LA dilated - Mild mitral valve regurgitation - Mild TV regurgitation - Rt ventricle systolic pressure elevated > 60 mm Hg   CT Chest wo contrast (10/13/2020) - Mild groundglass and reticular opacities in the perihilar regions and lung bases. These may be due to some early edema, less likely atypical infection.  - Small right pleural effusion with adjacent  atelectasis.  - Cardiac lead with calcific coronary disease.  - Marked mediastinal and to lesser extent hilar adenopathy. This may be due to a benign etiology such as sarcoidosis or due to lymphoma/metastatic disease.  - Enlarged, slightly irregular contour of the liver which could reflect cirrhosis.  - Partially imaged indeterminate left adrenal nodule.  - 7 mm nodule in the lingula. Follow-up as per below.    Laboratory Data:  High Sensitivity Troponin:   Recent Labs  Lab 12/08/20 1453 12/08/20 1653  TROPONINIHS 16 15     Chemistry Recent Labs  Lab 12/11/20 0250 12/12/20 0450 12/13/20 0218  NA 138 138 137  K 3.9 3.6 4.1  CL 104 100 102  CO2 25 28 29   GLUCOSE 109* 112* 169*  BUN 27* 23 22  CREATININE 1.65* 1.61* 1.64*  CALCIUM 10.0 9.8 9.8  GFRNONAA 46* 48* 47*  ANIONGAP 9 10 6     Recent Labs  Lab 12/08/20 1453 12/09/20 0010  PROT 7.1 7.6  ALBUMIN 2.6* 2.6*  AST 14* 17  ALT 18 18  ALKPHOS 348* 350*  BILITOT 1.1 0.6   Hematology Recent Labs  Lab 12/10/20 0258 12/11/20 0250 12/12/20 0450  WBC 6.8 6.4 6.5  RBC 4.09* 4.11* 4.16*  HGB 9.6* 9.5* 9.6*  HCT 33.8* 33.4* 34.3*  MCV 82.6 81.3 82.5  MCH 23.5* 23.1* 23.1*  MCHC 28.4* 28.4* 28.0*  RDW 17.8* 18.0* 18.5*  PLT 218 239 232   BNP Recent Labs  Lab 12/08/20 1453  BNP 1,213.0*    DDimer No results for input(s): DDIMER in the last 168 hours.   Radiology/Studies:  No results found.   Assessment and Plan:   Acute systolic and diastolic heart failure, new onset - Presented with worsening shortness of breath, orthopnea, PND, leg edema 3 to 74-month ago - BNP 1213 POA - CXR with pulmonary edema POA -  Hs Trop negative x2 - Echo 12/09/20 showed EF 35-35%, no RWMA, diastolic parameter indeterminate, RV wall motion limited review, contractility of apex with reduced contractility of mid RV and base, RV systolic function mildly reduced, RV size moderately enlarged, mild elevated PASP, RVSP 38.73mmHg, RA  mildly dilated, mild MR, mild to moderate TR, mild AS.  (EF was 50-55% with diastolic dysfunction on 10/13/20 Echo) - LHC 2010 at [facility] showed EF 45% with global hypokinesis, marginal arteries and RCA 60 to 70% stenosis but nothing amendable to PCI.  - Net -6.5L since admission, weight is down from 232 to 214 pounds  - clinically hypervolemic, continue IV Lasix  BID until euvolemic  - query cardiac sarcoidosis versus ischemic CM versus ETOH induced CM   - GDMT: continue Coreg  BID, recommend transition Losartan  daily to Entresto 49/51 BID, if renal function and BP tolerating, start spironolactone 12.5mg  daily, SGLT2i can be added outpatient or before discharge  - consider right and left heart cath to evaluate underlying etiology and adequacy of diuresis, will defer final decision to MD and the patient upon further discussion  CAD - per records review, patient had calcific coronary artery on CT 09/2020, left heart cath from 2010 with marginal arteries and RCA 60 to 70% stenosis but nothing amendable to PCI (he does not recall any LHC in the past)  - Hs trop negative x2 - EKG with non-specific changes - given acutely worsened EF, consider right and left cardiac cath  - continue medical therapy with coreg, transition losartan to entresto, would resume ASA if no contraindication   CKD stage IIIa Nephrotic syndrome  Hypoalbuminemia - renal index is stable near baseline with diuresis, non-oliguric, trend daily BMP  - workup for etiology of nephrotic syndrome per family med/[facility], potential biopsy   Cirrhosis  - INR 1.2 , PLT 232k, alka phos elevated, anasarca improving  - Abd ultrasound 12/08/20 with cirrhosis, small ascites  - query hepatic sarcoidosis versus ETOH induced cirrhosis   COPD Pulmonary sarcoidosis  OSA Type 2 DM ETOH use  - managed per family med      Risk Assessment/Risk Scores:        New York Heart Association (NYHA) Functional Class NYHA Class III         For questions or updates, please contact [facility] Please consult [email or web address] for contact info under    Signed, Cyndi Bender, NP  12/13/2020 1:31 PM   Patient seen and examined. Agree with assessment and plan. Charles Krueger is a very pleasant 65 year old gentleman who has a history of pulmonary sarcoidosis, type 2 diabetes mellitus, COPD, obstructive sleep apnea, and known CAD who had undergone prior cardiac catheterization in 2010 for which medical therapy was recommended circumflex marginal and RCA stenosis.  He had previously been evaluated at [facility] in April 2020 and was found to be acutely hypoxic felt to be due to new onset heart failure.  His sarcoidosis is followed at the [place].  An echo Doppler study 2022 reportedly showed an EF of 50 to 55% with moderate diastolic dysfunction, mild MR mild TR and moderately severe pulmonary hypertension with PA pressure at 60 mm.  His most recent echo Doppler study on December 09, 2020 since his admission here now shows further reduction of LV function with EF of 30 to 35%, mild RV function reduction and dilated RV with estimated PA pressure at 38.8 mm.  There is mild to moderate tricuspid regurgitation, mild aortic stenosis, mild MR.  Patient  states he has had tense lower extremity bilateral edema.  His edema seems to initiate in his lower abdomen leading to subsequent scrotal edema and bilateral leg swelling.  His leg edema has been fairly tense but this has improved with diuresis.  Needs admission net urine output is -6.5 L with weight reduced from 2 32-2 14.  He has been on IV Lasix 80 mg twice a day.  Initial creatinine was 1.72 which has slightly improved to 1.64 today.  On exam he is in no acute distress.  JVD approximately 8 cm.  Lungs were clear without wheezing.  Rhythm was regular 1/6 systolic murmur.  There was no S3 gallop.  Abdomen was mildly protuberant but nontender.  He continued to have 1-2+ tense lower extremity edema to above the  knees.  Neurologic exam is grossly nonfocal.  He has normal affect and mood.  After much discussion with the patient, I have recommended definitive evaluation with right and left heart cardiac catheterization and make certain there is not an ischemic component to his worsening heart function.  His edema may also be contributed by his presumed nephrotic syndrome with low oncotic pressure intravascularly contributing to leg swelling.  With his renal insufficiency, we will hold losartan in anticipation of possible right and left heart catheterization tomorrow.  Ultimately, would favor transition to [facility] depending upon renal function.I have reviewed the risks, indications, and alternatives to cardiac catheterization, possible angioplasty, and stenting with the patient. Risks include but are not limited to bleeding, infection, vascular injury, stroke, myocardial infection, arrhythmia, kidney injury, radiation-related injury in the case of prolonged fluoroscopy use, emergency cardiac surgery, and death. The patient understands the risks of serious complication is 1-2 in 1000 with diagnostic cardiac cath and 1-2% or less with angioplasty/stenting.  Tentatively set up for right left heart catheterization tomorrow.  Lennette Bihari, MD, [facility] 12/13/2020 3:02 PM

## 2020-12-13 NOTE — Progress Notes (Signed)
Heart Failure Stewardship Pharmacist Progress Note   PCP: Pcp, No PCP-Cardiologist: None    HPI:  65 yo M with PMH of pulmonary sarcoidosis, diabetes, COPD, and OSA. He [facility] ED [facility] with shortness of breath, LE edema, and abdominal distention. CXR with findings of mild to moderate congestive heart failure. An ECHO was done on 6/17 and found to have LVEF 30-35% and mildly reduced RV systolic function. Prior ECHO showed LVEF 50-55%.  Current HF Medications: Furosemide 80 mg IV BID Carvedilol 25 mg BID Losartan 50 mg daily  Prior to admission HF Medications: Carvedilol 25 mg BID Losartan 100 mg daily (with HCTZ 12.5 mg daily)  Pertinent Lab Values: Serum creatinine 1.64, BUN 22, Potassium 4.1, Sodium 137, BNP 1213, Magnesium 1.7  Vital Signs: Weight: 214 lbs (admission weight: 224 lbs) Blood pressure: 160/80s Heart rate: 70s   Medication Assistance / Insurance Benefits Check: Does the patient have prescription insurance?  Yes Type of insurance plan: [facility] + [facility]  Outpatient Pharmacy:  Prior to admission outpatient pharmacy: [facility] Is the patient willing to use [facility] TOC pharmacy at discharge? Yes Is the patient willing to transition their outpatient pharmacy to utilize a [facility] outpatient pharmacy?   No    Assessment: 1. Acute on chronic systolic CHF (EF 67-12%), due to unknown etiology. Pending cardiology consult for possible ischemic evaluation. NYHA class III symptoms. - Agree with increasing to furosemide 80 mg IV BID - Agree with increasing to carvedilol 25 mg BID - Continue losartan 50 mg daily. Consider optimizing to Entresto 49/51 mg BID for further BP reduction and optimization for HFrEF. - Consider starting spironolactone 25 mg daily prior to discharge - Consider starting Jardiance 10 mg daily prior to discharge   Plan: 1) Medication changes recommended at this time: - Stop losartan and start Entresto 49/51 mg BID  2)  Patient assistance: - [facility] insurance: both [place] and [place] on [facility] formulary - HF TOC appt made for 7/1  3)  Education  - To be completed prior to discharge  Sharen Hones, PharmD, BCPS Heart Failure Engineer, building services Phone [phone]

## 2020-12-14 ENCOUNTER — Encounter (HOSPITAL_COMMUNITY): Payer: Self-pay | Admitting: Cardiology

## 2020-12-14 ENCOUNTER — Encounter (HOSPITAL_COMMUNITY)
Admission: EM | Disposition: A | Discharge status: Home / Self Care | Payer: Self-pay | Source: Home / Self Care | Attending: Student in an Organized Health Care Education/Training Program

## 2020-12-14 DIAGNOSIS — I2510 Atherosclerotic heart disease of native coronary artery without angina pectoris: Secondary | ICD-10-CM

## 2020-12-14 DIAGNOSIS — I420 Dilated cardiomyopathy: Secondary | ICD-10-CM

## 2020-12-14 DIAGNOSIS — K7460 Unspecified cirrhosis of liver: Secondary | ICD-10-CM

## 2020-12-14 DIAGNOSIS — I25119 Atherosclerotic heart disease of native coronary artery with unspecified angina pectoris: Secondary | ICD-10-CM

## 2020-12-14 DIAGNOSIS — I5041 Acute combined systolic (congestive) and diastolic (congestive) heart failure: Secondary | ICD-10-CM

## 2020-12-14 HISTORY — PX: RIGHT/LEFT HEART CATH AND CORONARY ANGIOGRAPHY: CATH118266

## 2020-12-14 LAB — GLUCOSE, CAPILLARY
Glucose-Capillary: 110 mg/dL — ABNORMAL HIGH (ref 70–99)
Glucose-Capillary: 111 mg/dL — ABNORMAL HIGH (ref 70–99)
Glucose-Capillary: 118 mg/dL — ABNORMAL HIGH (ref 70–99)
Glucose-Capillary: 222 mg/dL — ABNORMAL HIGH (ref 70–99)

## 2020-12-14 LAB — POCT I-STAT EG7
Acid-Base Excess: 6 mmol/L — ABNORMAL HIGH (ref 0.0–2.0)
Acid-Base Excess: 6 mmol/L — ABNORMAL HIGH (ref 0.0–2.0)
Bicarbonate: 32.9 mmol/L — ABNORMAL HIGH (ref 20.0–28.0)
Bicarbonate: 33.5 mmol/L — ABNORMAL HIGH (ref 20.0–28.0)
Calcium, Ion: 1.45 mmol/L — ABNORMAL HIGH (ref 1.15–1.40)
Calcium, Ion: 1.45 mmol/L — ABNORMAL HIGH (ref 1.15–1.40)
HCT: 33 % — ABNORMAL LOW (ref 39.0–52.0)
HCT: 33 % — ABNORMAL LOW (ref 39.0–52.0)
Hemoglobin: 11.2 g/dL — ABNORMAL LOW (ref 13.0–17.0)
Hemoglobin: 11.2 g/dL — ABNORMAL LOW (ref 13.0–17.0)
O2 Saturation: 52 %
O2 Saturation: 56 %
Potassium: 4 mmol/L (ref 3.5–5.1)
Potassium: 4 mmol/L (ref 3.5–5.1)
Sodium: 142 mmol/L (ref 135–145)
Sodium: 142 mmol/L (ref 135–145)
TCO2: 35 mmol/L — ABNORMAL HIGH (ref 22–32)
TCO2: 35 mmol/L — ABNORMAL HIGH (ref 22–32)
pCO2, Ven: 61.1 mmHg — ABNORMAL HIGH (ref 44.0–60.0)
pCO2, Ven: 61.2 mmHg — ABNORMAL HIGH (ref 44.0–60.0)
pH, Ven: 7.339 (ref 7.250–7.430)
pH, Ven: 7.347 (ref 7.250–7.430)
pO2, Ven: 30 mmHg — CL (ref 32.0–45.0)
pO2, Ven: 32 mmHg (ref 32.0–45.0)

## 2020-12-14 LAB — CBC
HCT: 35.2 % — ABNORMAL LOW (ref 39.0–52.0)
Hemoglobin: 10 g/dL — ABNORMAL LOW (ref 13.0–17.0)
MCH: 23.4 pg — ABNORMAL LOW (ref 26.0–34.0)
MCHC: 28.4 g/dL — ABNORMAL LOW (ref 30.0–36.0)
MCV: 82.2 fL (ref 80.0–100.0)
Platelets: 241 10*3/uL (ref 150–400)
RBC: 4.28 MIL/uL (ref 4.22–5.81)
RDW: 20 % — ABNORMAL HIGH (ref 11.5–15.5)
WBC: 6.7 10*3/uL (ref 4.0–10.5)
nRBC: 0 % (ref 0.0–0.2)

## 2020-12-14 LAB — POCT I-STAT 7, (LYTES, BLD GAS, ICA,H+H)
Acid-Base Excess: 6 mmol/L — ABNORMAL HIGH (ref 0.0–2.0)
Bicarbonate: 31.3 mmol/L — ABNORMAL HIGH (ref 20.0–28.0)
Calcium, Ion: 1.48 mmol/L — ABNORMAL HIGH (ref 1.15–1.40)
HCT: 34 % — ABNORMAL LOW (ref 39.0–52.0)
Hemoglobin: 11.6 g/dL — ABNORMAL LOW (ref 13.0–17.0)
O2 Saturation: 91 %
Potassium: 4.2 mmol/L (ref 3.5–5.1)
Sodium: 141 mmol/L (ref 135–145)
TCO2: 33 mmol/L — ABNORMAL HIGH (ref 22–32)
pCO2 arterial: 49.4 mmHg — ABNORMAL HIGH (ref 32.0–48.0)
pH, Arterial: 7.41 (ref 7.350–7.450)
pO2, Arterial: 62 mmHg — ABNORMAL LOW (ref 83.0–108.0)

## 2020-12-14 LAB — BASIC METABOLIC PANEL
Anion gap: 8 (ref 5–15)
BUN: 26 mg/dL — ABNORMAL HIGH (ref 8–23)
CO2: 27 mmol/L (ref 22–32)
Calcium: 10.2 mg/dL (ref 8.9–10.3)
Chloride: 100 mmol/L (ref 98–111)
Creatinine, Ser: 1.66 mg/dL — ABNORMAL HIGH (ref 0.61–1.24)
GFR, Estimated: 46 mL/min — ABNORMAL LOW (ref >60)
Glucose, Bld: 126 mg/dL — ABNORMAL HIGH (ref 70–99)
Potassium: 4.7 mmol/L (ref 3.5–5.1)
Sodium: 135 mmol/L (ref 135–145)

## 2020-12-14 LAB — MAGNESIUM: Magnesium: 2 mg/dL (ref 1.7–2.4)

## 2020-12-14 SURGERY — RIGHT/LEFT HEART CATH AND CORONARY ANGIOGRAPHY
Anesthesia: LOCAL

## 2020-12-14 MED ORDER — SODIUM CHLORIDE 0.9% FLUSH
3.0000 mL | Freq: Two times a day (BID) | INTRAVENOUS | Status: DC
  Administered 2020-12-15 – 2020-12-17 (×5): 3 mL via INTRAVENOUS

## 2020-12-14 MED ORDER — MIDAZOLAM HCL 2 MG/2ML IJ SOLN
INTRAMUSCULAR | Status: AC
  Filled 2020-12-14: qty 2

## 2020-12-14 MED ORDER — HEPARIN SODIUM (PORCINE) 1000 UNIT/ML IJ SOLN
INTRAMUSCULAR | Status: DC | PRN
  Administered 2020-12-14: 5000 [IU] via INTRAVENOUS

## 2020-12-14 MED ORDER — FENTANYL CITRATE (PF) 100 MCG/2ML IJ SOLN
INTRAMUSCULAR | Status: DC | PRN
  Administered 2020-12-14: 25 ug via INTRAVENOUS

## 2020-12-14 MED ORDER — HYDRALAZINE HCL 20 MG/ML IJ SOLN: 10.0000 mg | INTRAMUSCULAR | Status: AC | PRN

## 2020-12-14 MED ORDER — ENOXAPARIN SODIUM 40 MG/0.4ML IJ SOSY
40.0000 mg | PREFILLED_SYRINGE | INTRAMUSCULAR | Status: DC
  Administered 2020-12-15 – 2020-12-17 (×3): 40 mg via SUBCUTANEOUS
  Filled 2020-12-14 (×3): qty 0.4

## 2020-12-14 MED ORDER — LABETALOL HCL 5 MG/ML IV SOLN
10.0000 mg | INTRAVENOUS | Status: AC | PRN
  Administered 2020-12-14: 10 mg via INTRAVENOUS
  Filled 2020-12-14: qty 4

## 2020-12-14 MED ORDER — SODIUM CHLORIDE 0.9 % IV SOLN: 250.0000 mL | INTRAVENOUS | Status: DC | PRN

## 2020-12-14 MED ORDER — LIDOCAINE HCL (PF) 1 % IJ SOLN
INTRAMUSCULAR | Status: DC | PRN
  Administered 2020-12-14 (×2): 2 mL

## 2020-12-14 MED ORDER — VERAPAMIL HCL 2.5 MG/ML IV SOLN
INTRAVENOUS | Status: AC
  Filled 2020-12-14: qty 2

## 2020-12-14 MED ORDER — ONDANSETRON HCL 4 MG/2ML IJ SOLN: 4.0000 mg | Freq: Four times a day (QID) | INTRAMUSCULAR | Status: DC | PRN

## 2020-12-14 MED ORDER — HEPARIN (PORCINE) IN NACL 1000-0.9 UT/500ML-% IV SOLN
INTRAVENOUS | Status: DC | PRN
  Administered 2020-12-14 (×2): 500 mL

## 2020-12-14 MED ORDER — LIDOCAINE HCL (PF) 1 % IJ SOLN
INTRAMUSCULAR | Status: AC
  Filled 2020-12-14: qty 30

## 2020-12-14 MED ORDER — FENTANYL CITRATE (PF) 100 MCG/2ML IJ SOLN
INTRAMUSCULAR | Status: AC
  Filled 2020-12-14: qty 2

## 2020-12-14 MED ORDER — FUROSEMIDE 10 MG/ML IJ SOLN
80.0000 mg | Freq: Two times a day (BID) | INTRAMUSCULAR | Status: DC
  Administered 2020-12-14 – 2020-12-15 (×3): 80 mg via INTRAVENOUS
  Filled 2020-12-14 (×3): qty 8

## 2020-12-14 MED ORDER — MIDAZOLAM HCL 2 MG/2ML IJ SOLN
INTRAMUSCULAR | Status: DC | PRN
  Administered 2020-12-14: 1 mg via INTRAVENOUS

## 2020-12-14 MED ORDER — SODIUM CHLORIDE 0.9 % IV SOLN: INTRAVENOUS | Status: AC

## 2020-12-14 MED ORDER — HEPARIN SODIUM (PORCINE) 1000 UNIT/ML IJ SOLN
INTRAMUSCULAR | Status: AC
  Filled 2020-12-14: qty 1

## 2020-12-14 MED ORDER — SODIUM CHLORIDE 0.9% FLUSH: 3.0000 mL | INTRAVENOUS | Status: DC | PRN

## 2020-12-14 MED ORDER — HEPARIN (PORCINE) IN NACL 1000-0.9 UT/500ML-% IV SOLN
INTRAVENOUS | Status: AC
  Filled 2020-12-14: qty 1000

## 2020-12-14 MED ORDER — VERAPAMIL HCL 2.5 MG/ML IV SOLN
INTRAVENOUS | Status: DC | PRN
  Administered 2020-12-14: 10 mL via INTRA_ARTERIAL

## 2020-12-14 MED ORDER — SACUBITRIL-VALSARTAN 49-51 MG PO TABS
1.0000 | ORAL_TABLET | Freq: Two times a day (BID) | ORAL | Status: DC
  Administered 2020-12-15 – 2020-12-17 (×5): 1 via ORAL
  Filled 2020-12-14 (×6): qty 1

## 2020-12-14 SURGICAL SUPPLY — 12 items
CATH BALLN WEDGE 5F 110CM (CATHETERS) ×2 IMPLANT
CATH OPTITORQUE TIG 4.0 5F (CATHETERS) ×2 IMPLANT
DEVICE RAD COMP TR BAND LRG (VASCULAR PRODUCTS) ×2 IMPLANT
GLIDESHEATH SLEND SS 6F .021 (SHEATH) ×2 IMPLANT
GUIDEWIRE INQWIRE 1.5J.035X260 (WIRE) ×1 IMPLANT
INQWIRE 1.5J .035X260CM (WIRE) ×2
KIT HEART LEFT (KITS) ×2 IMPLANT
PACK CARDIAC CATHETERIZATION (CUSTOM PROCEDURE TRAY) ×2 IMPLANT
SHEATH GLIDE SLENDER 4/5FR (SHEATH) ×2 IMPLANT
TRANSDUCER W/STOPCOCK (MISCELLANEOUS) ×2 IMPLANT
TUBING CIL FLEX 10 FLL-RA (TUBING) ×2 IMPLANT
WIRE EMERALD 3MM-J .025X260CM (WIRE) ×2 IMPLANT

## 2020-12-14 NOTE — Progress Notes (Signed)
Heart Failure Stewardship Pharmacist Progress Note   PCP: Pcp, No PCP-Cardiologist: None    HPI:  65 yo M with PMH of pulmonary sarcoidosis, diabetes, COPD, and OSA. He presented to the ED after [facility] on [facility]16 with shortness of breath, LE edema, and abdominal distention. CXR with findings of mild to moderate congestive heart failure. An ECHO was done on 6/17 and found to have LVEF 30-35% and mildly reduced RV systolic function. Prior ECHO showed LVEF 50-55%. R/LHC today.  Current HF Medications: Furosemide 80 mg IV BID Carvedilol 25 mg BID Losartan 50 mg daily  Prior to admission HF Medications: Carvedilol 25 mg BID Losartan 100 mg daily (with HCTZ 12.5 mg daily)  Pertinent Lab Values: Serum creatinine 1.66, BUN 26, Potassium 4.7, Sodium 135, BNP 1213, Magnesium 2.0  Vital Signs: Weight: 207 lbs (admission weight: 224 lbs) Blood pressure: 160/90s Heart rate: 60-70s   Medication Assistance / Insurance Benefits Check: Does the patient have prescription insurance?  Yes Type of insurance plan: [facility] + [facility]  Outpatient Pharmacy:  Prior to admission outpatient pharmacy: [facility] Is the patient willing to use [facility] TOC pharmacy at discharge? Yes Is the patient willing to transition their outpatient pharmacy to utilize a [facility] outpatient pharmacy?   No    Assessment: 1. Acute on chronic systolic CHF (EF 15-17%), due to unknown etiology. Pending R/LHC today. NYHA class III symptoms. - Continue furosemide 80 mg IV BID until cath completed to assess volume status - Agree with increasing to carvedilol 25 mg BID - Continue losartan 50 mg daily. Consider optimizing to Entresto 49/51 mg BID for further BP reduction and optimization for HFrEF. - Consider starting spironolactone 25 mg daily prior to discharge - Consider starting Jardiance 10 mg daily prior to discharge   Plan: 1) Medication changes recommended at this time: - Stop losartan and start Entresto 49/51 mg  BID  2) Patient assistance: - [facility] insurance: both [place] and [place] on [facility] formulary - HF TOC appt made for 7/1  3)  Education  - To be completed prior to discharge  Sharen Hones, PharmD, BCPS Heart Failure Engineer, building services Phone [phone]

## 2020-12-14 NOTE — Progress Notes (Addendum)
Progress Note  Patient Name: Charles Krueger Date of Encounter: 12/14/2020  Ms [facility] Cardiologist: None   Subjective   Sitting up in chair. Breathing is ok. No specific complaints today.   Inpatient Medications    Scheduled Meds:  arformoterol  15 mcg Nebulization BID   And   umeclidinium bromide  1 puff Inhalation Daily   aspirin  81 mg Oral Daily   atorvastatin  20 mg Oral Daily   carvedilol  25 mg Oral BID WC   enoxaparin (LOVENOX) injection  40 mg Subcutaneous Q24H   furosemide  80 mg Intravenous BID   insulin aspart  0-9 Units Subcutaneous TID WC   ipratropium  1 spray Each Nare Daily   nitroGLYCERIN  1 inch Topical Q6H   sodium chloride flush  3 mL Intravenous Q12H   sodium chloride flush  3 mL Intravenous Q12H   Continuous Infusions:  sodium chloride     sodium chloride 10 mL/hr at 12/14/20 0739   PRN Meds: sodium chloride, acetaminophen **OR** acetaminophen, albuterol, sodium chloride flush   Vital Signs    Vitals:   12/13/20 2104 12/13/20 2357 12/14/20 0514 12/14/20 0711  BP: (!) 166/93  (!) 164/93   Pulse: 88  80   Resp: 16 15 17 17   Temp: 99.1 F (37.3 C) 98 F (36.7 C) 98.5 F (36.9 C) 98.9 F (37.2 C)  TempSrc: Oral Oral Oral   SpO2: 98%  97%   Weight:   93.9 kg   Height:        Intake/Output Summary (Last 24 hours) at 12/14/2020 0800 Last data filed at 12/14/2020 0300 Gross per 24 hour  Intake 960 ml  Output 1675 ml  Net -715 ml   Last 3 Weights 12/14/2020 12/13/2020 12/12/2020  Weight (lbs) 207 lb 214 lb 217 lb 4.8 oz  Weight (kg) 93.895 kg 97.07 kg 98.567 kg      Telemetry    SR, artifact  - Personally Reviewed  ECG    No new tracing this morning  Physical Exam   GEN: No acute distress.   Neck: No JVD Cardiac: RRR, no murmurs, rubs, or gallops.  Respiratory: mild expiratory wheeze GI: Soft, nontender, non-distended  MS: No edema; No deformity. Neuro:  Nonfocal  Psych: Normal affect   Labs    High Sensitivity  Troponin:   Recent Labs  Lab 12/08/20 1453 12/08/20 1653  TROPONINIHS 16 15      Chemistry Recent Labs  Lab 12/08/20 1453 12/09/20 0010 12/10/20 0258 12/12/20 0450 12/13/20 0218 12/14/20 0224  NA 139 138   < > 138 137 135  K 4.5 4.1   < > 3.6 4.1 4.7  CL 110 106   < > 100 102 100  CO2 22 24   < > 28 29 27   GLUCOSE 97 95   < > 112* 169* 126*  BUN 27* 25*   < > 23 22 26*  CREATININE 1.72* 1.70*   < > 1.61* 1.64* 1.66*  CALCIUM 9.6 9.7   < > 9.8 9.8 10.2  PROT 7.1 7.6  --   --   --   --   ALBUMIN 2.6* 2.6*  --   --   --   --   AST 14* 17  --   --   --   --   ALT 18 18  --   --   --   --   ALKPHOS 348* 350*  --   --   --   --  BILITOT 1.1 0.6  --   --   --   --   GFRNONAA 44* 45*   < > 48* 47* 46*  ANIONGAP 7 8   < > 10 6 8    < > = values in this interval not displayed.     Hematology Recent Labs  Lab 12/10/20 0258 12/11/20 0250 12/12/20 0450  WBC 6.8 6.4 6.5  RBC 4.09* 4.11* 4.16*  HGB 9.6* 9.5* 9.6*  HCT 33.8* 33.4* 34.3*  MCV 82.6 81.3 82.5  MCH 23.5* 23.1* 23.1*  MCHC 28.4* 28.4* 28.0*  RDW 17.8* 18.0* 18.5*  PLT 218 239 232    BNP Recent Labs  Lab 12/08/20 1453  BNP 1,213.0*     DDimer No results for input(s): DDIMER in the last 168 hours.   Radiology    No results found.  Cardiac Studies   Echo: 12/09/20  IMPRESSIONS     1. Left ventricular ejection fraction, by estimation, is 30 to 35%. The  left ventricle has moderately decreased function. The left ventricle has  no regional wall motion abnormalities. Left ventricular diastolic  parameters are indeterminate.   2. The RV wall motion is not seen as well as we would like but there is  contractility of the apex with reduced contractility of the mid RV and  base ( McConnels Sign)      Consider evaluation for pulmonary embolus is clinically indicated.      . Right ventricular systolic function is mildly reduced. The right  ventricular size is moderately enlarged. There is mildly elevated   pulmonary artery systolic pressure. The estimated right ventricular  systolic pressure is 38.8 mmHg.   3. Right atrial size was mildly dilated.   4. The mitral valve is grossly normal. Mild mitral valve regurgitation.   5. Tricuspid valve regurgitation is mild to moderate.   6. The aortic valve is grossly normal. Aortic valve regurgitation is not  visualized. Mild aortic valve stenosis.   FINDINGS   Left Ventricle: Left ventricular ejection fraction, by estimation, is 30  to 35%. The left ventricle has moderately decreased function. The left  ventricle has no regional wall motion abnormalities. The left ventricular  internal cavity size was normal in  size. There is no left ventricular hypertrophy. Left ventricular diastolic  parameters are indeterminate.   Right Ventricle: The RV wall motion is not seen as well as we would like  but there is contractility of the apex with reduced contractility of the  mid RV and base ( McConnels Sign)  Consider evaluation for pulmonary embolus is clinically indicated.  The right ventricular size is moderately enlarged. Right vetricular wall  thickness was not well visualized. Right ventricular systolic function is  mildly reduced. There is mildly elevated pulmonary artery systolic  pressure. The tricuspid regurgitant  velocity is 2.99 m/s, and with an assumed right atrial pressure of 3 mmHg,  the estimated right ventricular systolic pressure is 38.8 mmHg.   Left Atrium: Left atrial size was normal in size.   Right Atrium: Right atrial size was mildly dilated.   Pericardium: There is no evidence of pericardial effusion.   Mitral Valve: The mitral valve is grossly normal. Mild mitral valve  regurgitation.   Tricuspid Valve: The tricuspid valve is normal in structure. Tricuspid  valve regurgitation is mild to moderate.   Aortic Valve: The aortic valve is grossly normal. Aortic valve  regurgitation is not visualized. Mild aortic stenosis is  present. Aortic  valve mean gradient measures  4.0 mmHg. Aortic valve peak gradient measures  8.5 mmHg. Aortic valve area, by VTI  measures 1.96 cm.   Pulmonic Valve: The pulmonic valve was grossly normal. Pulmonic valve  regurgitation is not visualized.   Aorta: The aortic root and ascending aorta are structurally normal, with  no evidence of dilitation.   IAS/Shunts: The atrial septum is grossly normal.   Patient Profile     65 y.o. male with a hx of pulmonary sarcoidosis, type 2 diabetes, non-oxygen dependent COPD, OSA, EtOH abuse, history of pancreatitis with pseudocyst and abscess, gout, adrenal adenoma, hypertension, hyperlipidemia,CAD without prior intervention, CKD stage III, cirrhosis, who is being seen 12/13/2020 for the evaluation of CHF at the request of Dr Austin Miles.  Assessment & Plan    Acute systolic and diastolic heart failure, new onset: Presented with worsening shortness of breath, orthopnea, PND, leg edema 3 to 39-month ago. BNP 1213 and CXR with pulmonary edema. -- Echo 12/09/20 showed EF 35-35%, no RWMA, diastolic parameter indeterminate, RV wall motion limited review, contractility of apex with reduced contractility of mid RV and base, RV systolic function mildly reduced, RV size moderately enlarged, mild elevated PASP, RVSP 38.80mmHg, RA mildly dilated, mild MR, mild to moderate TR, mild AS.  (EF was 50-55% with diastolic dysfunction on 10/13/20 Echo) -- Net -7.5L and weight down to 207lbs -- Treated with IV lasix 80mg  BID -- on GDMT with Coreg 25mg  BID, recommend transition Losartan 50mg  daily to Entresto 49/51 BID, if renal function and BP tolerating, and consider adding spironolactone 12.5mg  daily post cath -- SGLT2i can be added outpatient or before discharge  -- planned for [facility] today   CAD: per records review, patient had calcific coronary artery on CT 09/2020, left heart cath from 2010 with marginal arteries and RCA 60 to 70% stenosis but nothing amendable to PCI  (he does not recall any LHC in the past) -- Hs trop negative x2, EKG without significant changes -- planned for [facility] today   CKD stage IIIa/Nephrotic syndrome/Hypoalbuminemia: Cr remains stable at 1.6 today -- workup for etiology of nephrotic syndrome per family med/[facility] neph[facility], potential biopsy   Cirrhosis: INR 1.2 , PLT 232k, alka phos elevated, anasarca improving  -- Abd ultrasound 12/08/20 with cirrhosis, small ascites  COPD: stable, mild wheezing on exam -- continue nebs per primary   For questions or updates, please contact [facility] Please consult [email or web address] for contact info under        Signed, 2011, NP  12/14/2020, 8:00 AM     Patient seen and examined. Agree with assessment and plan.  Patient back from right and left heart catheterization.  Cath angiographic studies reviewed with Dr. 12/10/20.  We will plan medical management of his moderate multivessel coronary artery disease following his LAD, circumflex marginal, ramus intermediate and RCA.  The distal RCA is occluded but the PDA is collateralized.  In addition, hemodynamics suggest moderate combined pulmonary hypertension with a mean PA pressure at 40 to 43 mm transpulmonary gradient of 20 to 22 mm with LVEDP 21 mm, PC WP 18 mm and mean RAP 15 mm.  With significant edema, not a candidate at present for amlodipine.  With CAD will add Imdur 30 mg which should aid in preload reduction.   Laverda Page, MD, [facility] 12/14/2020 4:02 PM

## 2020-12-14 NOTE — Progress Notes (Signed)
HD#6 Subjective:  Overnight Events: Made NPO.  Declined CPAP.    Mr. Washam is sitting upright in his recliner resting comfortably.  He notes that he is still unable to lie flat but feels as though his swelling is decreased and his breathing is improved.  Suspect he will be able to lie flat for his procedure today.  Patient has been ambulating throughout his room. Discussed the plan that he will be taking for a cardiac catheterization today.  Patient does not have any questions about the procedure.  No other acute concerns at the time of my examination.  Objective:  Vital signs in last 24 hours: Vitals:   12/13/20 1959 12/13/20 2104 12/13/20 2357 12/14/20 0514  BP:  (!) 166/93  (!) 164/93  Pulse:  88  80  Resp:  16 15 17   Temp:  99.1 F (37.3 C) 98 F (36.7 C) 98.5 F (36.9 C)  TempSrc:  Oral Oral Oral  SpO2: 96% 98%  97%  Weight:    93.9 kg  Height:       Supplemental O2: Nasal Cannula SpO2: 97 % O2 Flow Rate (L/min): 2 L/min FiO2 (%): 28 %  Physical Exam:  Constitutional: Resting comfortably, no acute distress HENT: normocephalic atraumatic Eyes: conjunctiva non-erythematous Neck: supple Cardiovascular: regular rate and rhythm, no m/r/g. 2+ pitting edema of lower extremities  Pulmonary/Chest: normal work of breathing on 2 L nasal cannula, crackles at the lung bases bilaterally MSK: normal bulk and tone Neurological: alert & oriented x 3 Skin: warm and dry.  Psych: Normal mood and thought process  Filed Weights   12/12/20 0458 12/13/20 0535 12/14/20 0514  Weight: 98.6 kg 97.1 kg 93.9 kg     Intake/Output Summary (Last 24 hours) at 12/14/2020 0635 Last data filed at 12/14/2020 0300 Gross per 24 hour  Intake 960 ml  Output 1675 ml  Net -715 ml    Net IO Since Admission: -7,542 mL [12/14/20 0635]  Pertinent Labs: CBC Latest Ref Rng & Units 12/12/2020 12/11/2020 12/10/2020  WBC 4.0 - 10.5 K/uL 6.5 6.4 6.8  Hemoglobin 13.0 - 17.0 g/dL 12/12/2020) 3.2(D) 9.2(E)  Hematocrit  39.0 - 52.0 % 34.3(L) 33.4(L) 33.8(L)  Platelets 150 - 400 K/uL 232 239 218    CMP Latest Ref Rng & Units 12/14/2020 12/13/2020 12/12/2020  Glucose 70 - 99 mg/dL 12/14/2020) 341(D) 622(W)  BUN 8 - 23 mg/dL 979(G) 22 23  Creatinine 0.61 - 1.24 mg/dL 92(J) 1.94(R) 7.40(C)  Sodium 135 - 145 mmol/L 135 137 138  Potassium 3.5 - 5.1 mmol/L 4.7 4.1 3.6  Chloride 98 - 111 mmol/L 100 102 100  CO2 22 - 32 mmol/L 27 29 28   Calcium 8.9 - 10.3 mg/dL 1.44(Y 9.8 9.8  Total Protein 6.5 - 8.1 g/dL - - -  Total Bilirubin 0.3 - 1.2 mg/dL - - -  Alkaline Phos 38 - 126 U/L - - -  AST 15 - 41 U/L - - -  ALT 0 - 44 U/L - - -   Imaging: No results found.  Assessment/Plan:   Principal Problem:   Acute exacerbation of CHF (congestive heart failure) (HCC) Active Problems:   OSA (obstructive sleep apnea)   Diabetes mellitus (HCC)   Pulmonary sarcoidosis (HCC)   Stage 3b chronic kidney disease (HCC)  Patient Summary: Charles Krueger is a 65 y.o. with a pertinent PMH of pulmonary sarcoidosis, type 2 diabetes, COPD, OSA, Paget's disease, alcohol use disorder who presented with shortness of breath and was admitted for  acute hypoxic respiratory failure and significant volume overload  Acute on chronic heart failure with reduced ejection fraction Echo obtained during this admission shows LVEF 30 to 35% with moderately decreased function. Hx left heart cath in 2010 with an EF of 45% with global hypokinesis.  His marginal arteries and RCA were found to be 60 to 70% stenosed but nothing was amenable to PCI.  Patient will be taken for right and left heart catheterization today.  Appreciate cardiology's assistance in his care.  Patient is diuresing well with improvement of his anasarca, dyspnea. Patient still unable to lay flat. We will continue lasix to 80 mg IV BID today. Continue carvedilol 25 mg twice daily. Plan to start Entresto, spironolactone after catheterization today.  If cardiac cath without coronary disease, patient  will need further work-up for his acute cardiomyopathy including work-up for cardiac sarcoidosis. -Cardiology consulted, appreciate their recommendations and assistance -Lasix to 80 mg IV twice daily -Start Entresto 100, spironolactone 12.5 after catheterization -Consider Farxiga at discharge -Continue carvedilol 25 mg BID -Continue aspirin and atorvastatin -Strict ins and outs and daily weights -Daily BMP, magnesium levels. -Wean oxygen as tolerable -PT/OT consult placed   CKD stage IIIa Nephrotic syndrome Per chart review, baseline creatinine around 1.5-1.6 with previous proteinuria noted in the past up to approximately 2.2 g.  Upon admission total protein over 3 g, patient with hypoalbuminemia and edema, meeting criteria for nephrotic syndrome.  Do not believe he needs anticoagulation per KDIGO guidelines, albumin is 2.6. Patient also with hematuria, will need further outpatient workup for potential glomerulonephritis.   Creatinine stable at this time will transition to [facility] after catheterization.  If swelling without significant improvement can consider amiloride for his nephrotic syndrome. -Transition to [facility] post catheterization -Consider amiloride if no improvement edema with Lasix -HIV negative -Anti-PLA2 ordered -Monitor renal function closely while diuresing -Daily BMP  History of COPD Stable at this time, without evidence of wheezing on examination. -Continue Atrovent, Proventil, Incruse Ellipta, Brovana   History of OSA -Continue to encourage nightly CPAP   Hx of EtOH use disorder -Patient not showing signs or symptoms of withdrawal.  Diet: Heart Healthy IVF: None,None VTE: Enoxaparin Code: Full TOC recs: None at this time Family Update: Plan to call patient's daughter after catheterization results  Dispo: Anticipated discharge to Home in 1 to 2 days pending further diuresis with IV medications.  Thalia Bloodgood DO Internal Medicine Resident  PGY-1 Pager [phone] Please contact the on call pager after 5 pm and on weekends at [phone].

## 2020-12-14 NOTE — Interval H&P Note (Signed)
History and Physical Interval Note:  12/14/2020 10:08 AM  Charles Krueger  has presented today for surgery, with the diagnosis of LV dysfunction / Cardiomyopathy.  The various methods of treatment have been discussed with the patient and family. After consideration of risks, benefits and other options for treatment, the patient has consented to  Procedure(s): RIGHT/LEFT HEART CATH AND CORONARY ANGIOGRAPHY (N/A)  PERCUTANEOUS CORONARY INTERVENTION   as a surgical intervention.  The patient's history has been reviewed, patient examined, no change in status, stable for surgery.  I have reviewed the patient's chart and labs.  Questions were answered to the patient's satisfaction.    Cath Lab Visit (complete for each Cath Lab visit)  Clinical Evaluation Leading to the Procedure:   ACS: No.  Non-ACS:    Anginal Classification: CCS III - NYHA CLASS III CHF  Anti-ischemic medical therapy: Minimal Therapy (1 class of medications)  Non-Invasive Test Results: Equivocal test results -moderate severely reduced LVEF on echocardiogram.  Presentation of NYHA class III CHF.  Prior CABG: No previous CABG    Bryan Lemma

## 2020-12-14 NOTE — Progress Notes (Signed)
Pt refused CPAP tonight.

## 2020-12-15 DIAGNOSIS — I2729 Other secondary pulmonary hypertension: Secondary | ICD-10-CM

## 2020-12-15 DIAGNOSIS — N049 Nephrotic syndrome with unspecified morphologic changes: Secondary | ICD-10-CM

## 2020-12-15 LAB — BASIC METABOLIC PANEL
Anion gap: 5 (ref 5–15)
BUN: 26 mg/dL — ABNORMAL HIGH (ref 8–23)
CO2: 33 mmol/L — ABNORMAL HIGH (ref 22–32)
Calcium: 10 mg/dL (ref 8.9–10.3)
Chloride: 100 mmol/L (ref 98–111)
Creatinine, Ser: 1.72 mg/dL — ABNORMAL HIGH (ref 0.61–1.24)
GFR, Estimated: 44 mL/min — ABNORMAL LOW (ref >60)
Glucose, Bld: 164 mg/dL — ABNORMAL HIGH (ref 70–99)
Potassium: 4 mmol/L (ref 3.5–5.1)
Sodium: 138 mmol/L (ref 135–145)

## 2020-12-15 LAB — CBC
HCT: 33.1 % — ABNORMAL LOW (ref 39.0–52.0)
Hemoglobin: 9.8 g/dL — ABNORMAL LOW (ref 13.0–17.0)
MCH: 24.1 pg — ABNORMAL LOW (ref 26.0–34.0)
MCHC: 29.6 g/dL — ABNORMAL LOW (ref 30.0–36.0)
MCV: 81.3 fL (ref 80.0–100.0)
Platelets: 233 10*3/uL (ref 150–400)
RBC: 4.07 MIL/uL — ABNORMAL LOW (ref 4.22–5.81)
RDW: 20.2 % — ABNORMAL HIGH (ref 11.5–15.5)
WBC: 7.3 10*3/uL (ref 4.0–10.5)
nRBC: 0 % (ref 0.0–0.2)

## 2020-12-15 LAB — GLUCOSE, CAPILLARY
Glucose-Capillary: 133 mg/dL — ABNORMAL HIGH (ref 70–99)
Glucose-Capillary: 161 mg/dL — ABNORMAL HIGH (ref 70–99)
Glucose-Capillary: 133 mg/dL — ABNORMAL HIGH (ref 70–99)
Glucose-Capillary: 158 mg/dL — ABNORMAL HIGH (ref 70–99)

## 2020-12-15 LAB — MAGNESIUM: Magnesium: 1.9 mg/dL (ref 1.7–2.4)

## 2020-12-15 MED ORDER — ISOSORB DINITRATE-HYDRALAZINE 20-37.5 MG PO TABS
0.5000 | ORAL_TABLET | Freq: Three times a day (TID) | ORAL | Status: DC
  Administered 2020-12-15 – 2020-12-16 (×4): 0.5 via ORAL
  Filled 2020-12-15 (×4): qty 1

## 2020-12-15 MED ORDER — ATORVASTATIN CALCIUM 40 MG PO TABS
40.0000 mg | ORAL_TABLET | Freq: Every day | ORAL | Status: DC
  Administered 2020-12-15 – 2020-12-17 (×3): 40 mg via ORAL
  Filled 2020-12-15 (×3): qty 1

## 2020-12-15 NOTE — Plan of Care (Signed)

## 2020-12-15 NOTE — Progress Notes (Signed)
HD#7 Subjective:  Overnight Events: No acute events overnight.  Patient refused CPAP  Charles Krueger is resting in his recliner comfortably.  Feels as though his breathing is improved and he is able to walk to and from the bathroom without feeling as short of breath.  He notes that he is able to lay flat for short periods of time.  Patient notes that cardiology updated him on his catheterization findings and he has no questions about that.  We discussed that we will continue to diurese him and add on additional medications to optimize his CAD management.  Patient also denies any right wrist pain or swelling around cath site.  Patient endorses feeling somewhat stressed and hopeless.  Many stressors secondary to his job working for the [employer].  When asked if there is anything we can do patient shook his head no.  States that he is trying to take everything 1 day at a time.  Objective:  Vital signs in last 24 hours: Vitals:   12/14/20 2100 12/15/20 0033 12/15/20 0422 12/15/20 0620  BP:  (!) 164/75 (!) 145/71   Pulse: 70 65 67   Resp: 16 16 18    Temp:  97.8 F (36.6 C) 99.1 F (37.3 C)   TempSrc:  Oral Oral   SpO2: 97% 95% 96%   Weight:    93.8 kg  Height:       Supplemental O2: Nasal Cannula SpO2: 96 % O2 Flow Rate (L/min): 2 L/min FiO2 (%): 28 %  Physical Exam:  Constitutional: Resting comfortably, no acute distress HENT: normocephalic atraumatic Eyes: conjunctiva non-erythematous Neck: supple Cardiovascular: regular rate and rhythm, no m/r/g. 1+ pitting edema of lower extremities  Pulmonary/Chest: normal work of breathing on 2 L nasal cannula, lungs clear to auscultation MSK: normal bulk and tone Neurological: alert & oriented x 3 Skin: warm and dry.  No erythema or edema around cath site of right wrist. Psych: Flat affect  Filed Weights   12/13/20 0535 12/14/20 0514 12/15/20 0620  Weight: 97.1 kg 93.9 kg 93.8 kg     Intake/Output Summary (Last 24 hours) at 12/15/2020  0756 Last data filed at 12/15/2020 12/17/2020 Gross per 24 hour  Intake --  Output 3450 ml  Net -3450 ml    Net IO Since Admission: -10,992 mL [12/15/20 0756]  Pertinent Labs: CBC Latest Ref Rng & Units 12/15/2020 12/14/2020 12/14/2020  WBC 4.0 - 10.5 K/uL 7.3 6.7 -  Hemoglobin 13.0 - 17.0 g/dL 12/16/2020) 10.0(L) 11.2(L)  Hematocrit 39.0 - 52.0 % 33.1(L) 35.2(L) 33.0(L)  Platelets 150 - 400 K/uL 233 241 -    CMP Latest Ref Rng & Units 12/15/2020 12/14/2020 12/14/2020  Glucose 70 - 99 mg/dL 12/16/2020) - -  BUN 8 - 23 mg/dL 656(C) - -  Creatinine 12(X - 1.24 mg/dL 5.17) - -  Sodium 0.01(V - 145 mmol/L 138 142 142  Potassium 3.5 - 5.1 mmol/L 4.0 4.0 4.0  Chloride 98 - 111 mmol/L 100 - -  CO2 22 - 32 mmol/L 33(H) - -  Calcium 8.9 - 10.3 mg/dL 494 - -  Total Protein 6.5 - 8.1 g/dL - - -  Total Bilirubin 0.3 - 1.2 mg/dL - - -  Alkaline Phos 38 - 126 U/L - - -  AST 15 - 41 U/L - - -  ALT 0 - 44 U/L - - -   Imaging: CARDIAC CATHETERIZATION  Result Date: 12/14/2020  Hemodynamic findings consistent with Moderate Pulmonary Hypertension. -> Mean PAP 43 mmHg, LVEDP/PCWP  18-21 mmHg  Ost RCA to Prox RCA lesion is 50% stenosed. Prox RCA lesion is 70% stenosed. RV Branch lesion is 55% stenosed.  Mid RCA to Dist RCA lesion is 100% stenosed. RPAV lesion is 100% stenosed. (The distal RPDA fills via collaterals from RV marginal, mild collaterals from left to right to the PL system)  Prox LAD to Mid LAD lesion is 45% stenosed. 3rd Diag lesion is 50% stenosed.  Ramus lesion is 60% stenosed. Discrete having focal lesion  Mid Cx to Dist Cx lesion is 55% stenosed.  SUMMARY  Severe Single-Vessel Disease with Moderate diffuse Multivessel Disease  -50% ostial RCA followed by 70% proximal RCA after major RVM then mid RCA 100% CTO with bridging collaterals and R-R collaterals from RV M filling the distal PDA.  (Small caliber distal vessels)  -Discrete napkin ring concentric 60 to 65% ostial RI  -Diffuse 40 to 50% proximal  to mid LAD, proximal small D3 and extensive 40 to 60% LCx-OM1  Moderate Combined Pulmonary Hypertension-73/23 mmHg with mean PAP 40 to 43 mmHg (Transpulmonary Gradient 20 to 22 mmHg).  LVEDP 21 mmHg with PCWP 18 mmHg; mean RAP 15 mmHg.  Mildly Reduced Cardiac Index: (Fick) Cardiac Output 5.79, Index 2.75 RECOMMENDATIONS  Recommend medical management for existing CAD  Continue diuresis although left-sided filling pressures are not significantly elevated--May benefit from pulmonary Inodilator (milrinone) Bryan Lemma, MD   Assessment/Plan:   Principal Problem:   Acute exacerbation of CHF (congestive heart failure) (HCC) Active Problems:   OSA (obstructive sleep apnea)   Diabetes mellitus (HCC)   CAD (coronary artery disease)   Pulmonary sarcoidosis (HCC)   Stage 3b chronic kidney disease (HCC)   Cirrhosis (HCC)   Dilated cardiomyopathy (HCC)  Patient Summary: Charles Krueger is a 65 y.o. with a pertinent PMH of pulmonary sarcoidosis, type 2 diabetes, COPD, OSA, Paget's disease, alcohol use disorder who presented with shortness of breath and was admitted for acute hypoxic respiratory failure and significant volume overload  Acute on chronic heart failure with reduced ejection fraction Ischemic cardiomyopathy S/P RHC & LHC, post op day 1. Patient with multi-vessel disease. Occluded RCA, with collateral PDA.  Nothing was amenable to PCI, will continue with medical management.  Patient continues to improve daily with decreased oxygen supplementation and improvement of his dyspnea on exertion.  Weight down from 101 kg to 93.8 kg.  Continue Lasix 80 mg IV twice daily.  Patient started on Entresto 100 and spironolactone today.  Cardiology will also initiate BiDil therapy secondary to patient's CAD as well as his pulmonary arterial and venous hypertension.  Suspect patient's continuous edema secondary to his nephrotic syndrome. -Cardiology consulted, appreciate their recommendations and  assistance -Lasix to 80 mg IV twice daily -Continue Entresto 100, spironolactone 12.5  -Start BiDil 20-37.5 mg, 0.5 tab 3 times daily -Consider Farxiga at discharge -Continue carvedilol 25 mg BID -Continue aspirin and high intensity atorvastatin -Strict ins and outs and daily weights -Daily BMP, magnesium levels.  Electrolytes stable -Wean oxygen as tolerable -PT/OT consult placed   CKD stage IIIa Nephrotic syndrome Per chart review, baseline creatinine around 1.5-1.6.  Current creatinine of 1.72.  Patient started on Entresto today. If swelling without significant improvement can consider amiloride for his nephrotic syndrome. -Transition to [facility] post catheterization -Consider amiloride if no improvement of edema with Lasix -HIV negative -Anti-PLA2 ordered, will reach out to nursing staff about steps to have this drawn.  It is a "miscellaneous" lab order. -Monitor renal function closely while diuresing  History of COPD Stable at this time, without evidence of wheezing on examination. -Continue Atrovent, Proventil, Incruse Ellipta, Brovana   History of OSA Patient continues to decline nightly CPAP. -Continue to encourage nightly CPAP  Diet: Heart Healthy IVF: None,None VTE: Enoxaparin Code: Full TOC recs: None at this time Family Update: Patient's daughter updated yesterday, left voicemail.  Will call again today to see if she has any questions.  Dispo: Anticipated discharge to Home in 0-1 days pending further diuresis with IV medications.  Thalia Bloodgood DO Internal Medicine Resident PGY-1 Pager [phone] Please contact the on call pager after 5 pm and on weekends at [phone].

## 2020-12-15 NOTE — Progress Notes (Addendum)
Progress Note  Patient Name: Charles Krueger Date of Encounter: 12/15/2020  [facility] Cardiologist: None   Subjective   No complaints this morning. Wants to go home soon. Long conversation regarding meds.   Inpatient Medications    Scheduled Meds:  arformoterol  15 mcg Nebulization BID   And   umeclidinium bromide  1 puff Inhalation Daily   aspirin  81 mg Oral Daily   atorvastatin  40 mg Oral Daily   carvedilol  25 mg Oral BID WC   enoxaparin (LOVENOX) injection  40 mg Subcutaneous Q24H   furosemide  80 mg Intravenous BID   ipratropium  1 spray Each Nare Daily   isosorbide-hydrALAZINE  0.5 tablet Oral TID   sacubitril-valsartan  1 tablet Oral BID   sodium chloride flush  3 mL Intravenous Q12H   sodium chloride flush  3 mL Intravenous Q12H   Continuous Infusions:  sodium chloride     PRN Meds: sodium chloride, acetaminophen **OR** acetaminophen, albuterol, ondansetron (ZOFRAN) IV, sodium chloride flush   Vital Signs    Vitals:   12/14/20 2100 12/15/20 0033 12/15/20 0422 12/15/20 0620  BP:  (!) 164/75 (!) 145/71   Pulse: 70 65 67   Resp: 16 16 18    Temp:  97.8 F (36.6 C) 99.1 F (37.3 C)   TempSrc:  Oral Oral   SpO2: 97% 95% 96%   Weight:    93.8 kg  Height:        Intake/Output Summary (Last 24 hours) at 12/15/2020 0807 Last data filed at 12/15/2020 12/17/2020 Gross per 24 hour  Intake --  Output 3250 ml  Net -3250 ml   Last 3 Weights 12/15/2020 12/14/2020 12/13/2020  Weight (lbs) 206 lb 12.8 oz 207 lb 214 lb  Weight (kg) 93.804 kg 93.895 kg 97.07 kg      Telemetry    SR - Personally Reviewed  ECG    No new tracing  Physical Exam   GEN: No acute distress.   Neck: No JVD Cardiac: RRR, no murmurs, rubs, or gallops.  Respiratory: Mil expiratory wheezing GI: Soft, nontender, non-distended  MS: 1+ bilateral edema; No deformity. Right radial cath site stable.  Neuro:  Nonfocal  Psych: Normal affect   Labs    High Sensitivity Troponin:   Recent  Labs  Lab 12/08/20 1453 12/08/20 1653  TROPONINIHS 16 15      Chemistry Recent Labs  Lab 12/08/20 1453 12/09/20 0010 12/10/20 0258 12/13/20 0218 12/14/20 0224 12/14/20 1032 12/14/20 1039 12/15/20 0154  NA 139 138   < > 137 135 141 142  142 138  K 4.5 4.1   < > 4.1 4.7 4.2 4.0  4.0 4.0  CL 110 106   < > 102 100  --   --  100  CO2 22 24   < > 29 27  --   --  33*  GLUCOSE 97 95   < > 169* 126*  --   --  164*  BUN 27* 25*   < > 22 26*  --   --  26*  CREATININE 1.72* 1.70*   < > 1.64* 1.66*  --   --  1.72*  CALCIUM 9.6 9.7   < > 9.8 10.2  --   --  10.0  PROT 7.1 7.6  --   --   --   --   --   --   ALBUMIN 2.6* 2.6*  --   --   --   --   --   --  AST 14* 17  --   --   --   --   --   --   ALT 18 18  --   --   --   --   --   --   ALKPHOS 348* 350*  --   --   --   --   --   --   BILITOT 1.1 0.6  --   --   --   --   --   --   GFRNONAA 44* 45*   < > 47* 46*  --   --  44*  ANIONGAP 7 8   < > 6 8  --   --  5   < > = values in this interval not displayed.     Hematology Recent Labs  Lab 12/12/20 0450 12/14/20 1032 12/14/20 1039 12/14/20 1332 12/15/20 0154  WBC 6.5  --   --  6.7 7.3  RBC 4.16*  --   --  4.28 4.07*  HGB 9.6*   < > 11.2*  11.2* 10.0* 9.8*  HCT 34.3*   < > 33.0*  33.0* 35.2* 33.1*  MCV 82.5  --   --  82.2 81.3  MCH 23.1*  --   --  23.4* 24.1*  MCHC 28.0*  --   --  28.4* 29.6*  RDW 18.5*  --   --  20.0* 20.2*  PLT 232  --   --  241 233   < > = values in this interval not displayed.    BNP Recent Labs  Lab 12/08/20 1453  BNP 1,213.0*     DDimer No results for input(s): DDIMER in the last 168 hours.   Radiology    CARDIAC CATHETERIZATION  Result Date: 12/14/2020  Hemodynamic findings consistent with Moderate Pulmonary Hypertension. -> Mean PAP 43 mmHg, LVEDP/PCWP 18-21 mmHg  Ost RCA to Prox RCA lesion is 50% stenosed. Prox RCA lesion is 70% stenosed. RV Branch lesion is 55% stenosed.  Mid RCA to Dist RCA lesion is 100% stenosed. RPAV lesion is  100% stenosed. (The distal RPDA fills via collaterals from RV marginal, mild collaterals from left to right to the PL system)  Prox LAD to Mid LAD lesion is 45% stenosed. 3rd Diag lesion is 50% stenosed.  Ramus lesion is 60% stenosed. Discrete having focal lesion  Mid Cx to Dist Cx lesion is 55% stenosed.  SUMMARY  Severe Single-Vessel Disease with Moderate diffuse Multivessel Disease  -50% ostial RCA followed by 70% proximal RCA after major RVM then mid RCA 100% CTO with bridging collaterals and R-R collaterals from RV M filling the distal PDA.  (Small caliber distal vessels)  -Discrete napkin ring concentric 60 to 65% ostial RI  -Diffuse 40 to 50% proximal to mid LAD, proximal small D3 and extensive 40 to 60% LCx-OM1  Moderate Combined Pulmonary Hypertension-73/23 mmHg with mean PAP 40 to 43 mmHg (Transpulmonary Gradient 20 to 22 mmHg).  LVEDP 21 mmHg with PCWP 18 mmHg; mean RAP 15 mmHg.  Mildly Reduced Cardiac Index: (Fick) Cardiac Output 5.79, Index 2.75 RECOMMENDATIONS  Recommend medical management for existing CAD  Continue diuresis although left-sided filling pressures are not significantly elevated--May benefit from pulmonary Inodilator (milrinone) Bryan Lemma, MD   Cardiac Studies   Echo: 12/09/20   IMPRESSIONS     1. Left ventricular ejection fraction, by estimation, is 30 to 35%. The  left ventricle has moderately decreased function. The left ventricle has  no regional wall motion abnormalities. Left  ventricular diastolic  parameters are indeterminate.   2. The RV wall motion is not seen as well as we would like but there is  contractility of the apex with reduced contractility of the mid RV and  base ( McConnels Sign)      Consider evaluation for pulmonary embolus is clinically indicated.      . Right ventricular systolic function is mildly reduced. The right  ventricular size is moderately enlarged. There is mildly elevated  pulmonary artery systolic pressure. The  estimated right ventricular  systolic pressure is 38.8 mmHg.   3. Right atrial size was mildly dilated.   4. The mitral valve is grossly normal. Mild mitral valve regurgitation.   5. Tricuspid valve regurgitation is mild to moderate.   6. The aortic valve is grossly normal. Aortic valve regurgitation is not  visualized. Mild aortic valve stenosis.   FINDINGS   Left Ventricle: Left ventricular ejection fraction, by estimation, is 30  to 35%. The left ventricle has moderately decreased function. The left  ventricle has no regional wall motion abnormalities. The left ventricular  internal cavity size was normal in  size. There is no left ventricular hypertrophy. Left ventricular diastolic  parameters are indeterminate.   Right Ventricle: The RV wall motion is not seen as well as we would like  but there is contractility of the apex with reduced contractility of the  mid RV and base ( McConnels Sign)  Consider evaluation for pulmonary embolus is clinically indicated.  The right ventricular size is moderately enlarged. Right vetricular wall  thickness was not well visualized. Right ventricular systolic function is  mildly reduced. There is mildly elevated pulmonary artery systolic  pressure. The tricuspid regurgitant  velocity is 2.99 m/s, and with an assumed right atrial pressure of 3 mmHg,  the estimated right ventricular systolic pressure is 38.8 mmHg.   Left Atrium: Left atrial size was normal in size.   Right Atrium: Right atrial size was mildly dilated.   Pericardium: There is no evidence of pericardial effusion.   Mitral Valve: The mitral valve is grossly normal. Mild mitral valve  regurgitation.   Tricuspid Valve: The tricuspid valve is normal in structure. Tricuspid  valve regurgitation is mild to moderate.   Aortic Valve: The aortic valve is grossly normal. Aortic valve  regurgitation is not visualized. Mild aortic stenosis is present. Aortic  valve mean gradient  measures 4.0 mmHg. Aortic valve peak gradient measures  8.5 mmHg. Aortic valve area, by VTI  measures 1.96 cm.   Pulmonic Valve: The pulmonic valve was grossly normal. Pulmonic valve  regurgitation is not visualized.   Aorta: The aortic root and ascending aorta are structurally normal, with  no evidence of dilitation.   IAS/Shunts: The atrial septum is grossly normal.  Cath: 12/14/20  Hemodynamic findings consistent with Moderate Pulmonary Hypertension. -> Mean PAP 43 mmHg, LVEDP/PCWP 18-21 mmHg Ost RCA to Prox RCA lesion is 50% stenosed. Prox RCA lesion is 70% stenosed. RV Branch lesion is 55% stenosed. Mid RCA to Dist RCA lesion is 100% stenosed. RPAV lesion is 100% stenosed. (The distal RPDA fills via collaterals from RV marginal, mild collaterals from left to right to the PL system) Prox LAD to Mid LAD lesion is 45% stenosed. 3rd Diag lesion is 50% stenosed. Ramus lesion is 60% stenosed. Discrete having focal lesion Mid Cx to Dist Cx lesion is 55% stenosed.   SUMMARY Severe Single-Vessel Disease with Moderate diffuse Multivessel Disease -50% ostial RCA followed by 70% proximal RCA  after major RVM then mid RCA 100% CTO with bridging collaterals and R-R collaterals from RV M filling the distal PDA.  (Small caliber distal vessels) -Discrete napkin ring concentric 60 to 65% ostial RI -Diffuse 40 to 50% proximal to mid LAD, proximal small D3 and extensive 40 to 60% LCx-OM1   Moderate Combined Pulmonary Hypertension-73/23 mmHg with mean PAP 40 to 43 mmHg (Transpulmonary Gradient 20 to 22 mmHg).  LVEDP 21 mmHg with PCWP 18 mmHg; mean RAP 15 mmHg. Mildly Reduced Cardiac Index: (Fick) Cardiac Output 5.79, Index 2.75     RECOMMENDATIONS Recommend medical management for existing CAD Continue diuresis although left-sided filling pressures are not significantly elevated--May benefit from pulmonary Inodilator (milrinone)     Bryan Lemmaavid Harding, MD  Diagnostic Dominance: Right    Patient  Profile     65 y.o. male  with a hx of pulmonary sarcoidosis, type 2 diabetes, non-oxygen dependent COPD, OSA, EtOH abuse, history of pancreatitis with pseudocyst and abscess, gout, adrenal adenoma, hypertension, hyperlipidemia,CAD without prior intervention, CKD stage III, cirrhosis, who is being seen 12/13/2020 for the evaluation of CHF at the request of Dr Austin MilesJinwala.  Assessment & Plan    Acute systolic and diastolic heart failure, new onset: Presented with worsening shortness of breath, orthopnea, PND, leg edema 3 to 758-month ago. BNP 1213 and CXR with pulmonary edema. -- Echo 12/09/20 showed EF 35-35%, no RWMA, diastolic parameter indeterminate, RV wall motion limited review, contractility of apex with reduced contractility of mid RV and base, RV systolic function mildly reduced, RV size moderately enlarged, mild elevated PASP, RVSP 38.748mmHg, RA mildly dilated, mild MR, mild to moderate TR, mild AS.  (EF was 50-55% with diastolic dysfunction on 10/13/20 Echo) -- Net -10.9L and weight down to 206 lbs -- Treated with IV lasix 80mg  BID -- on GDMT with Coreg 25mg  BID, has been transitioned Losartan 50mg  daily to Entresto 49/51 BID -- SGLT2i can be added outpatient or before discharge   PAH (moderate): PA pressure 43, PCWP 18 mmHg. Diuresing with IV lasix. Breathing has much improved -- recommendation for Imdur by Dr. Tresa EndoKelly yesterday, will order for BiDil 0.5 tab TID   CAD: Hs trop negative x2, EKG without significant changes. Underwent cardiac cath yesterday with severe multivessel CAD, 100% lesion mRCA with collaterals filling distal PDA, 60% RI, 40-50% p/mLAD. Recommendation for medical management.  -- continue ASA, statin, coreg, entresto   CKD stage IIIa/Nephrotic syndrome/Hypoalbuminemia: Cr remains stable with mild increase at 1.7 today -- will hold on adding spiro as he had contrast yesterday -- workup for etiology of nephrotic syndrome per family med/[facility] neph[facility], potential biopsy    Cirrhosis: INR 1.2 , PLT 232k, alka phos elevated, anasarca improving  -- Abd ultrasound 12/08/20 with cirrhosis, small ascites   COPD: stable, mild wheezing on exam -- continue nebs per primary  Remains on  @2L , needs to ambulate in the hallway with pulse Ox. Notes indicate follow up arranged in the [facility] 7/1  For questions or updates, please contact [facility] Please consult [email or web address] for contact info under        Signed, Laverda PageLindsay Roberts, NP  12/15/2020, 8:07 AM      Patient seen and examined. Agree with assessment and plan.  Patient feels better today.  He continues to have excellent diuresis on IV Lasix with I/O -10,992 since admission.  JVD not increased.  Lungs without rales.  Rhythm is regular sinus rhythm in the 80s.  Isolated PAC.  There is still 2+ tense  bilateral lower extremity edema contributed by his hypoalbuminemia/nephrotic syndrome.  With his combined pulmonary arterial and pulmonary venous hypertension we will add nitrate/hydralazine with BiDil.  Initiate low-dose today with probable increase to 1 tablet 3 times daily tomorrow.  Would not titrate Entresto presently with his contrast yesterday.  Creatinine 1.71 today.  Medical therapy for his CAD.  Recommend he stay in hospital today with possible discharge tomorrow   Lennette Bihari, MD, [facility] 12/15/2020 9:27 AM

## 2020-12-15 NOTE — Progress Notes (Signed)
Heart Failure Stewardship Pharmacist Progress Note   PCP: Pcp, No PCP-Cardiologist: None    HPI:  65 yo M with PMH of pulmonary sarcoidosis, diabetes, COPD, and OSA. He presented [facility] the ED [facility] with shortness of breath, LE edema, and abdominal distention. CXR with findings of mild to moderate congestive heart failure. An ECHO was done on 6/17 and found to have LVEF 30-35% and mildly reduced RV systolic function. Prior ECHO showed LVEF 50-55%. R/LHC done on 6/22 - showed severe single vessel disease with moderate diffuse multivessel disease along with moderate PH and preserved cardiac output.  Current HF Medications: Furosemide 80 mg IV BID Carvedilol 25 mg BID Entresto 49/51 mg BID BiDil 0.5 tabs TID  Prior to admission HF Medications: Carvedilol 25 mg BID Losartan 100 mg daily (with HCTZ 12.5 mg daily)  Pertinent Lab Values: Serum creatinine 1.72, BUN 26, Potassium 4.0, Sodium 138, BNP 1213, Magnesium 1.9  Vital Signs: Weight: 206 lbs (admission weight: 224 lbs) Blood pressure: 160/90s Heart rate: 60-70s   Medication Assistance / Insurance Benefits Check: Does the patient have prescription insurance?  Yes Type of insurance plan: [facility] + [facility]  Outpatient Pharmacy:  Prior to admission outpatient pharmacy: [facility] Is the patient willing to use [facility] TOC pharmacy at discharge? Yes Is the patient willing to transition their outpatient pharmacy to utilize a [facility] outpatient pharmacy?   No    Assessment: 1. Acute on chronic systolic CHF (EF 15-17%), due to unknown etiology. Pending R/LHC today. NYHA class III symptoms. - Continue furosemide 80 mg IV BID - Continue carvedilol 25 mg BID - Agree with starting Entresto 49/51 mg BID - Agree with starting BiDil 0.5 tabs TID - Consider starting spironolactone 25 mg daily prior to discharge - Consider starting Jardiance 10 mg daily prior to discharge   Plan: 1) Medication changes recommended at this  time: - Agree with changes as above  2) Patient assistance: - [facility] insurance: both [employer] and [place] on [facility] formulary - HF TOC appt made for 7/1  3)  Education  - To be completed prior to discharge  Sharen Hones, PharmD, BCPS Heart Failure Engineer, building services Phone [phone]

## 2020-12-16 ENCOUNTER — Other Ambulatory Visit (HOSPITAL_COMMUNITY): Payer: Self-pay

## 2020-12-16 LAB — BASIC METABOLIC PANEL
Anion gap: 7 (ref 5–15)
BUN: 25 mg/dL — ABNORMAL HIGH (ref 8–23)
CO2: 30 mmol/L (ref 22–32)
Calcium: 9.4 mg/dL (ref 8.9–10.3)
Chloride: 96 mmol/L — ABNORMAL LOW (ref 98–111)
Creatinine, Ser: 1.63 mg/dL — ABNORMAL HIGH (ref 0.61–1.24)
GFR, Estimated: 47 mL/min — ABNORMAL LOW (ref >60)
Glucose, Bld: 128 mg/dL — ABNORMAL HIGH (ref 70–99)
Potassium: 3.6 mmol/L (ref 3.5–5.1)
Sodium: 133 mmol/L — ABNORMAL LOW (ref 135–145)

## 2020-12-16 LAB — MAGNESIUM: Magnesium: 1.7 mg/dL (ref 1.7–2.4)

## 2020-12-16 LAB — GLUCOSE, CAPILLARY
Glucose-Capillary: 118 mg/dL — ABNORMAL HIGH (ref 70–99)
Glucose-Capillary: 206 mg/dL — ABNORMAL HIGH (ref 70–99)
Glucose-Capillary: 96 mg/dL (ref 70–99)
Glucose-Capillary: 250 mg/dL — ABNORMAL HIGH (ref 70–99)

## 2020-12-16 MED ORDER — HYDRALAZINE HCL 25 MG PO TABS
37.5000 mg | ORAL_TABLET | Freq: Three times a day (TID) | ORAL | Status: DC
  Administered 2020-12-16 – 2020-12-17 (×4): 37.5 mg via ORAL
  Filled 2020-12-16 (×4): qty 2

## 2020-12-16 MED ORDER — HYDRALAZINE HCL 25 MG PO TABS
37.5000 mg | ORAL_TABLET | Freq: Three times a day (TID) | ORAL | 0 refills | Status: DC
  Filled 2020-12-16: qty 135, 30d supply, fill #0

## 2020-12-16 MED ORDER — POTASSIUM CHLORIDE CRYS ER 20 MEQ PO TBCR
40.0000 meq | EXTENDED_RELEASE_TABLET | Freq: Once | ORAL | Status: AC
  Administered 2020-12-16: 40 meq via ORAL
  Filled 2020-12-16: qty 2

## 2020-12-16 MED ORDER — FUROSEMIDE 80 MG PO TABS
80.0000 mg | ORAL_TABLET | Freq: Two times a day (BID) | ORAL | Status: DC
  Administered 2020-12-17 (×2): 80 mg via ORAL
  Filled 2020-12-16 (×2): qty 1

## 2020-12-16 MED ORDER — ISOSORBIDE MONONITRATE ER 30 MG PO TB24
30.0000 mg | ORAL_TABLET | Freq: Every day | ORAL | Status: DC
  Administered 2020-12-16 – 2020-12-17 (×2): 30 mg via ORAL
  Filled 2020-12-16 (×2): qty 1

## 2020-12-16 MED ORDER — FUROSEMIDE 80 MG PO TABS
80.0000 mg | ORAL_TABLET | Freq: Two times a day (BID) | ORAL | Status: DC
  Administered 2020-12-16: 80 mg via ORAL
  Filled 2020-12-16: qty 1

## 2020-12-16 MED ORDER — EMPAGLIFLOZIN 10 MG PO TABS
10.0000 mg | ORAL_TABLET | Freq: Every day | ORAL | 0 refills | Status: DC
  Filled 2020-12-16: qty 14, 14d supply, fill #0

## 2020-12-16 MED ORDER — SACUBITRIL-VALSARTAN 49-51 MG PO TABS
1.0000 | ORAL_TABLET | Freq: Two times a day (BID) | ORAL | 0 refills | Status: DC
  Filled 2020-12-16: qty 60, 30d supply, fill #0

## 2020-12-16 MED ORDER — ATORVASTATIN CALCIUM 40 MG PO TABS
40.0000 mg | ORAL_TABLET | Freq: Every day | ORAL | 0 refills | Status: DC
  Filled 2020-12-16: qty 30, 30d supply, fill #0

## 2020-12-16 MED ORDER — FUROSEMIDE 10 MG/ML IJ SOLN
80.0000 mg | Freq: Once | INTRAMUSCULAR | Status: AC
  Administered 2020-12-16: 80 mg via INTRAVENOUS
  Filled 2020-12-16: qty 8

## 2020-12-16 MED ORDER — ISOSORBIDE MONONITRATE ER 30 MG PO TB24
30.0000 mg | ORAL_TABLET | Freq: Every day | ORAL | 0 refills | Status: DC
  Filled 2020-12-16: qty 30, 30d supply, fill #0

## 2020-12-16 MED ORDER — FUROSEMIDE 80 MG PO TABS
80.0000 mg | ORAL_TABLET | Freq: Two times a day (BID) | ORAL | 0 refills | Status: DC
  Filled 2020-12-16: qty 30, 15d supply, fill #0

## 2020-12-16 MED ORDER — EMPAGLIFLOZIN 10 MG PO TABS
10.0000 mg | ORAL_TABLET | Freq: Every day | ORAL | Status: DC
  Administered 2020-12-16 – 2020-12-17 (×2): 10 mg via ORAL
  Filled 2020-12-16 (×2): qty 1

## 2020-12-16 MED ORDER — MAGNESIUM SULFATE 2 GM/50ML IV SOLN
2.0000 g | Freq: Once | INTRAVENOUS | Status: AC
  Administered 2020-12-16: 2 g via INTRAVENOUS
  Filled 2020-12-16: qty 50

## 2020-12-16 NOTE — Progress Notes (Signed)
Patient ambulated in hallway on room air. O2 sat maintained around 88-89%. O2 sat occasionally dropped to 84% for a second before returning to 88%. No complaints of SOB.

## 2020-12-16 NOTE — Progress Notes (Signed)
Patient refused CPAP tonight 

## 2020-12-16 NOTE — Progress Notes (Signed)
Heart Failure Stewardship Pharmacist Progress Note   PCP: Pcp, No PCP-Cardiologist: Nicki Guadalajara, MD    HPI:  65 yo M with PMH of pulmonary sarcoidosis, diabetes, COPD, and OSA. He presented to the ED [facility] on [facility]16 with shortness of breath, LE edema, and abdominal distention. CXR with findings of mild to moderate congestive heart failure. An ECHO was done on 6/17 and found to have LVEF 30-35% and mildly reduced RV systolic function. Prior ECHO showed LVEF 50-55%. R/LHC done on 6/22 - showed severe single vessel disease with moderate diffuse multivessel disease along with moderate PH and preserved cardiac output.  Current HF Medications: Furosemide 80 mg IV x1, then furosemide 80 mg PO BID Carvedilol 25 mg BID Entresto 49/51 mg BID BiDil 0.5 tabs TID Jardiance 10 mg daily  Prior to admission HF Medications: Carvedilol 25 mg BID Losartan 100 mg daily (with HCTZ 12.5 mg daily)  Pertinent Lab Values: Serum creatinine 1.63, BUN 25, Potassium 3.6, Sodium 133, BNP 1213, Magnesium 1.7  Vital Signs: Weight: 204 lbs (admission weight: 224 lbs) Blood pressure: 140/90s Heart rate: 70-80s   Medication Assistance / Insurance Benefits Check: Does the patient have prescription insurance?  Yes Type of insurance plan: [facility] + [facility]  Outpatient Pharmacy:  Prior to admission outpatient pharmacy: [facility] Is the patient willing to use [facility] TOC pharmacy at discharge? Yes Is the patient willing to transition their outpatient pharmacy to utilize a [facility] outpatient pharmacy?   No    Assessment: 1. Acute on chronic systolic CHF (EF 91-63%), due to unknown etiology. Pending R/LHC today. NYHA class III symptoms. - Furosemide 80 mg IV x 1, then furosemide 80 mg PO BID - Continue carvedilol 25 mg BID - Continue Entresto 49/51 mg BID - Continue BiDil 0.5 tabs TID - Consider starting spironolactone 25 mg daily prior to discharge - Agree with starting Jardiance 10 mg daily    Plan: 1) Medication changes recommended at this time: - Agree with changes as above  2) Patient assistance: - [facility] insurance: both [employer] and [place] on [facility] formulary - HF TOC appt made for 7/1  3)  Education  - To be completed prior to discharge  Sharen Hones, PharmD, BCPS Heart Failure Engineer, building services Phone [phone]

## 2020-12-16 NOTE — Progress Notes (Addendum)
Progress Note  Patient Name: Charles Krueger Date of Encounter: 12/16/2020  [facility] Cardiologist: [facility] Senaida Ores, new to Dr. Tresa Endo  Subjective   Pt remains on O2, continues to diurese, but still with significant lower extremity edema. Plan for additional dose of IV lasix this morning.  Inpatient Medications    Scheduled Meds:  arformoterol  15 mcg Nebulization BID   And   umeclidinium bromide  1 puff Inhalation Daily   aspirin  81 mg Oral Daily   atorvastatin  40 mg Oral Daily   carvedilol  25 mg Oral BID WC   empagliflozin  10 mg Oral Daily   enoxaparin (LOVENOX) injection  40 mg Subcutaneous Q24H   furosemide  80 mg Intravenous Once   [START ON 12/17/2020] furosemide  80 mg Oral BID   ipratropium  1 spray Each Nare Daily   isosorbide-hydrALAZINE  0.5 tablet Oral TID   potassium chloride  40 mEq Oral Once   sacubitril-valsartan  1 tablet Oral BID   sodium chloride flush  3 mL Intravenous Q12H   sodium chloride flush  3 mL Intravenous Q12H   Continuous Infusions:  sodium chloride     PRN Meds: sodium chloride, acetaminophen **OR** acetaminophen, albuterol, ondansetron (ZOFRAN) IV, sodium chloride flush   Vital Signs    Vitals:   12/15/20 2100 12/16/20 0657 12/16/20 0847 12/16/20 0848  BP: 136/67     Pulse: 73     Resp: 18     Temp: 98.4 F (36.9 C)     TempSrc: Oral     SpO2: 90%  91% 91%  Weight:  92.7 kg    Height:        Intake/Output Summary (Last 24 hours) at 12/16/2020 1025 Last data filed at 12/16/2020 0911 Gross per 24 hour  Intake 800 ml  Output 2095 ml  Net -1295 ml   Last 3 Weights 12/16/2020 12/15/2020 12/14/2020  Weight (lbs) 204 lb 6.4 oz 206 lb 12.8 oz 207 lb  Weight (kg) 92.715 kg 93.804 kg 93.895 kg      Telemetry    Sinus rhythm HR 80-90s, PVCs - Personally Reviewed  ECG    No new tracings - Personally Reviewed  Physical Exam   GEN: No acute distress.   Neck: No JVD Cardiac: RRR, no murmurs, rubs, or gallops.   Respiratory: Clear to auscultation bilaterally. GI: Soft, nontender, non-distended  MS: lower extremity edema, tense below knee Neuro:  Nonfocal  Psych: Normal affect   Labs    High Sensitivity Troponin:   Recent Labs  Lab 12/08/20 1453 12/08/20 1653  TROPONINIHS 16 15      Chemistry Recent Labs  Lab 12/14/20 0224 12/14/20 1032 12/14/20 1039 12/15/20 0154 12/16/20 0422  NA 135   < > 142  142 138 133*  K 4.7   < > 4.0  4.0 4.0 3.6  CL 100  --   --  100 96*  CO2 27  --   --  33* 30  GLUCOSE 126*  --   --  164* 128*  BUN 26*  --   --  26* 25*  CREATININE 1.66*  --   --  1.72* 1.63*  CALCIUM 10.2  --   --  10.0 9.4  GFRNONAA 46*  --   --  44* 47*  ANIONGAP 8  --   --  5 7   < > = values in this interval not displayed.     Hematology Recent Labs  Lab 12/12/20 0450 12/14/20 1032 12/14/20 1039 12/14/20 1332 12/15/20 0154  WBC 6.5  --   --  6.7 7.3  RBC 4.16*  --   --  4.28 4.07*  HGB 9.6*   < > 11.2*  11.2* 10.0* 9.8*  HCT 34.3*   < > 33.0*  33.0* 35.2* 33.1*  MCV 82.5  --   --  82.2 81.3  MCH 23.1*  --   --  23.4* 24.1*  MCHC 28.0*  --   --  28.4* 29.6*  RDW 18.5*  --   --  20.0* 20.2*  PLT 232  --   --  241 233   < > = values in this interval not displayed.    BNPNo results for input(s): BNP, PROBNP in the last 168 hours.   DDimer No results for input(s): DDIMER in the last 168 hours.   Radiology    No results found.  Cardiac Studies   Left heart cath 12/14/20: Hemodynamic findings consistent with Moderate Pulmonary Hypertension. -> Mean PAP 43 mmHg, LVEDP/PCWP 18-21 mmHg Ost RCA to Prox RCA lesion is 50% stenosed. Prox RCA lesion is 70% stenosed. RV Branch lesion is 55% stenosed. Mid RCA to Dist RCA lesion is 100% stenosed. RPAV lesion is 100% stenosed. (The distal RPDA fills via collaterals from RV marginal, mild collaterals from left to right to the PL system) Prox LAD to Mid LAD lesion is 45% stenosed. 3rd Diag lesion is 50% stenosed. Ramus  lesion is 60% stenosed. Discrete having focal lesion Mid Cx to Dist Cx lesion is 55% stenosed.   SUMMARY Severe Single-Vessel Disease with Moderate diffuse Multivessel Disease -50% ostial RCA followed by 70% proximal RCA after major RVM then mid RCA 100% CTO with bridging collaterals and R-R collaterals from RV M filling the distal PDA.  (Small caliber distal vessels) -Discrete napkin ring concentric 60 to 65% ostial RI -Diffuse 40 to 50% proximal to mid LAD, proximal small D3 and extensive 40 to 60% LCx-OM1   Moderate Combined Pulmonary Hypertension-73/23 mmHg with mean PAP 40 to 43 mmHg (Transpulmonary Gradient 20 to 22 mmHg).  LVEDP 21 mmHg with PCWP 18 mmHg; mean RAP 15 mmHg. Mildly Reduced Cardiac Index: (Fick) Cardiac Output 5.79, Index 2.75     RECOMMENDATIONS Recommend medical management for existing CAD Continue diuresis although left-sided filling pressures are not significantly elevated--May benefit from pulmonary Inodilator (milrinone)   ______________________  Echo 12/09/20:  1. Left ventricular ejection fraction, by estimation, is 30 to 35%. The  left ventricle has moderately decreased function. The left ventricle has  no regional wall motion abnormalities. Left ventricular diastolic  parameters are indeterminate.   2. The RV wall motion is not seen as well as we would like but there is  contractility of the apex with reduced contractility of the mid RV and  base ( McConnels Sign)      Consider evaluation for pulmonary embolus is clinically indicated.      . Right ventricular systolic function is mildly reduced. The right  ventricular size is moderately enlarged. There is mildly elevated  pulmonary artery systolic pressure. The estimated right ventricular  systolic pressure is 38.8 mmHg.   3. Right atrial size was mildly dilated.   4. The mitral valve is grossly normal. Mild mitral valve regurgitation.   5. Tricuspid valve regurgitation is mild to moderate.   6. The  aortic valve is grossly normal. Aortic valve regurgitation is not  visualized. Mild aortic valve stenosis.  Patient Profile     65 y.o. 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 Pulmonary sarcoid - EF 30-35%, RV with reduced contractility of the mid RV and base - McConnels Sign - right and left heart cath yesterday with PH as above - right heart strain likely due to pulmonary hypertension - SOB improving, if unable to wean O2, may need to consider VQ scan - transitioned to entresto yesterday - diuresing on 80 mg BID with overall net negative 12 L and down 20 lbs - continues to have tense edema below his knees, will give additional dose of IV lasix today - will add jardiance and increase bidil - he gets medications from [place], will split bidil into hydralazine and imdur for ease of prescribing - recommend TED hose   CAD - cath yesterday with severe single vessel disease and moderate diffuse multivessel disease - cath results as above - medical therapy was recommended - continue ASA, 40 mg lipitor, BB - no chest pain   Hyperlipidemia - lipitor increased from 20 to 40 mg   CKD stage IIIa Nephrotic syndrome Hypoalbuminemia - renal function stable - additional IV lasix today, add jardiance   Cirrhosis - per abdominal US 12/08/20   COPD - remains on supplemental O2 - will need walking sats prior to discharge   Will keep him inpatient today. He is anxious to discharge.    He already has follow up [facility] July 1    For questions or updates, please contact [facility] Please consult [email or web address] for contact info under        Signed, Marcelino Duster, PA  12/16/2020, 10:25 AM     Patient seen and examined. Agree with assessment and plan.  Excellent diuresis since admission, now -12,197 with approximately 20 pound weight loss.  He continues to have tense lower extremity edema and had been transitioned to oral Lasix this morning.  We will give an additional 80 mg IV Lasix later today.  We will further slightly titrate isosorbide to 30 mg for his BiDil preparation.  He will also benefit from SGLT2 inhibition with Jardiance which should assist with his volume overload.  He is now on Entresto 49/51 mg twice a day.  Creatinine 0.63, slightly improved from 1.72.  Monitor closely.  Recommend support stockings above the knee.  Anticipate possible discharge tomorrow.   Lennette Bihari, MD, [facility] 12/16/2020 11:34 AM

## 2020-12-16 NOTE — Progress Notes (Signed)
HD#8 Subjective:  Overnight Events: No acute events overnight.  Patient refused CPAP  Charles Krueger is resting in his recliner comfortably.  Charles Krueger states he was not able to sleep last night due to difficulty relaxing. He has a lot on his mind, denies this being able to help in any way.Marland Kitchen He could lay down flat for a while but states that is not his usual sleeping position.  Refuse CPAP.  Patient believes that the swelling has improved, continues to endorse urinary frequency.  Discussed with patient that we hopefully will be able to discharge him in the next day or so.  Objective:  Vital signs in last 24 hours: Vitals:   12/15/20 2100 12/16/20 0657 12/16/20 0847 12/16/20 0848  BP: 136/67     Pulse: 73     Resp: 18     Temp: 98.4 F (36.9 C)     TempSrc: Oral     SpO2: 90%  91% 91%  Weight:  92.7 kg    Height:       Supplemental O2: Nasal Cannula SpO2: 91 % O2 Flow Rate (L/min): 2 L/min FiO2 (%): 28 %  Physical Exam:  Constitutional: Resting comfortably, no acute distress HENT: normocephalic atraumatic Eyes: conjunctiva non-erythematous Neck: supple Cardiovascular: regular rate and rhythm, no m/r/g.  Trace to 1+ pitting edema lower extremities Pulmonary/Chest: normal work of breathing on 2 L nasal cannula, lungs clear to auscultation MSK: normal bulk and tone Neurological: alert & oriented x 3 Skin: warm and dry.  No erythema or edema around cath site of right wrist. Psych: Flat affect  Filed Weights   12/14/20 0514 12/15/20 0620 12/16/20 0657  Weight: 93.9 kg 93.8 kg 92.7 kg     Intake/Output Summary (Last 24 hours) at 12/16/2020 1254 Last data filed at 12/16/2020 0911 Gross per 24 hour  Intake 800 ml  Output 2095 ml  Net -1295 ml    Net IO Since Admission: -12,197 mL [12/16/20 1254]  Pertinent Labs: CBC Latest Ref Rng & Units 12/15/2020 12/14/2020 12/14/2020  WBC 4.0 - 10.5 K/uL 7.3 6.7 -  Hemoglobin 13.0 - 17.0 g/dL 0.2(D) 10.0(L) 11.2(L)  Hematocrit 39.0 - 52.0 %  33.1(L) 35.2(L) 33.0(L)  Platelets 150 - 400 K/uL 233 241 -    CMP Latest Ref Rng & Units 12/16/2020 12/15/2020 12/14/2020  Glucose 70 - 99 mg/dL 741(O) 878(M) -  BUN 8 - 23 mg/dL 76(H) 20(N) -  Creatinine 0.61 - 1.24 mg/dL 4.70(J) 6.28(Z) -  Sodium 135 - 145 mmol/L 133(L) 138 142  Potassium 3.5 - 5.1 mmol/L 3.6 4.0 4.0  Chloride 98 - 111 mmol/L 96(L) 100 -  CO2 22 - 32 mmol/L 30 33(H) -  Calcium 8.9 - 10.3 mg/dL 9.4 66.2 -  Total Protein 6.5 - 8.1 g/dL - - -  Total Bilirubin 0.3 - 1.2 mg/dL - - -  Alkaline Phos 38 - 126 U/L - - -  AST 15 - 41 U/L - - -  ALT 0 - 44 U/L - - -   Imaging: No results found.  Assessment/Plan:   Principal Problem:   Acute exacerbation of CHF (congestive heart failure) (HCC) Active Problems:   OSA (obstructive sleep apnea)   Diabetes mellitus (HCC)   CAD (coronary artery disease)   Pulmonary sarcoidosis (HCC)   Stage 3b chronic kidney disease (HCC)   Cirrhosis (HCC)   Dilated cardiomyopathy (HCC)  Patient Summary: Charles Krueger is a 65 y.o. with a pertinent PMH of pulmonary sarcoidosis, type 2 diabetes,  COPD, OSA, Paget's disease, alcohol use disorder who presented with shortness of breath and was admitted for acute hypoxic respiratory failure and significant volume overload  Acute on chronic heart failure with reduced ejection fraction Ischemic cardiomyopathy S/P RHC & LHC, post op day 2. Patient with multi-vessel disease. Occluded RCA, with collateral PDA.  Nothing was amenable to PCI, will continue with medical management.  Patient is diuresing well, still requiring oxygen supplementation.  While ambulating on room air with nursing staff his oxygen saturation dropped to 84% while ambulating before returning to 88%. Weight down from 101 kg to 92.7 kg. Continue Lasix 80 mg IV twice daily.  We will continue his Entresto, cardiology separated his BiDil into hydralazine and Imdur.  Spironolactone was discontinued.  We will also start him on Jardiance on  discharge.  Plan for discharge tomorrow, patient would be unable to obtain his meds from the [place].  We will request TOC deliver meds to bed today patient will have his medications upon anticipated discharge tomorrow. -Cardiology consulted, appreciate their recommendations and assistance -Plan for additional dose of Lasix 80 mg IV today -Continue Entresto 100, spironolactone 12.5  -Start hydralazine 37.5 3 times daily, Imdur 30 mg daily -Jardiance at discharge -Continue carvedilol 25 mg BID -Compression stockings ordered by cardiology -Continue aspirin and high intensity atorvastatin -Strict ins and outs and daily weights -Daily BMP, magnesium levels.  Mag of 1.7, will give 2 g mag IV. -Wean oxygen as tolerable -PT/OT consult placed   CKD stage IIIa Nephrotic syndrome Per chart review, baseline creatinine around 1.5-1.6.  Current creatinine of 1.63.  Stable upon starting Entresto yesterday. -Continue Entresto as per above -Consider amiloride if no improvement of edema with Lasix -HIV negative -Anti-PLA2 ordered, will reach out to lab today to discuss obtaining the sample before patient's discharge -Monitor renal function closely while diuresing  History of COPD Stable at this time, without evidence of wheezing on examination. -Continue Atrovent, Proventil, Incruse Ellipta, Brovana   History of OSA Patient continues to decline nightly CPAP. -Continue to encourage nightly CPAP  Diet: Heart Healthy IVF: None,None VTE: Enoxaparin Code: Full TOC recs: None at this time Family Update: Updated patient's daughter yesterday.  All questions answered  Dispo: Anticipated discharge to Home tomorrow  Thalia Bloodgood DO Internal Medicine Resident PGY-1 Pager [phone] Please contact the on call pager after 5 pm and on weekends at [phone].

## 2020-12-16 NOTE — Discharge Instructions (Addendum)
Charles Krueger,   Thank you for allowing [place] to care for you during your hospitalization. You were initially brought into the hospital for concern for a new heart failure. We performed an echocardiogram, which showed decrease in functionality of your heart. Because of this, you had a right and left heart catheterization performed that showed some blockages in the arteries of your heart. You will be treated medically to prevent worsening of these vessels.  We have started many new medications during hospitalization.  You have an appointment to follow-up with the [facility] health cardiology team on 12/23/2020.  Please follow-up with them.  You are also treated for nephrotic syndrome, which is a disease of the kidney that can cause diffuse swelling.  I believe that this is contributing to your lower extremity swelling as well.  For this, I would also like you to see your kidney doctor and your primary care provider next week.  [facility] you would [facility] an appointment.  Aside from taking these new medications, please wear your CPAP nightly.  Please continue to try and avoid alcohol as well as tobacco products.  If you would like to start establishing care here at [facility], you can follow-up in our clinic.  We are located on the ground floor of the hospital.  Her phone number is [phone].  We would be more than happy to care for you.  I am glad you are feeling better overall and wish you the best Dr.Mishka Stegemann

## 2020-12-16 NOTE — Discharge Summary (Signed)
Name: Markise Haymer MRN: [id number] DOB: 1955-08-07 65 y.o. PCP: Pcp, No  Date of Admission: 12/08/2020  2:28 PM Date of Discharge: 12/17/20 Attending Physician: Dr. Cleda Daub  Discharge Diagnosis: Principal Problem:   Acute exacerbation of CHF (congestive heart failure) (HCC) Active Problems:   OSA (obstructive sleep apnea)   Diabetes mellitus (HCC)   CAD (coronary artery disease)   Pulmonary sarcoidosis (HCC)   Stage 3b chronic kidney disease (HCC)   Cirrhosis (HCC)   Dilated cardiomyopathy (HCC)   Discharge Medications: Allergies as of 12/17/2020       Reactions   Lisinopril Other (See Comments)   Acute renal failure        Medication List     STOP taking these medications    amLODipine 10 MG tablet Commonly known as: NORVASC   losartan-hydrochlorothiazide 100-12.5 MG tablet Commonly known as: HYZAAR       TAKE these medications    allopurinol 100 MG tablet Commonly known as: ZYLOPRIM Take 200 mg by mouth daily.   aspirin 81 MG tablet Take 162 mg by mouth at bedtime.   atorvastatin 40 MG tablet Commonly known as: LIPITOR Take 1 tablet (40 mg total) by mouth daily. What changed:  how much to take Another medication with the same name was removed. Continue taking this medication, and follow the directions you see here.   carvedilol 25 MG tablet Commonly known as: COREG Take 25 mg by mouth 2 (two) times daily with a meal.   Entresto 49-51 MG Generic drug: sacubitril-valsartan Take 1 tablet by mouth 2 (two) times daily.   fluticasone 50 MCG/ACT nasal spray Commonly known as: FLONASE Place 2 sprays into both nostrils daily.   furosemide 80 MG tablet Commonly known as: LASIX Take 1 tablet (80 mg total) by mouth 2 (two) times daily.   hydrALAZINE 25 MG tablet Commonly known as: APRESOLINE Take 1.5 tablets (37.5 mg total) by mouth every 8 (eight) hours.   ipratropium 0.03 % nasal spray Commonly known as: ATROVENT Place 2 sprays into both  nostrils daily.   isosorbide mononitrate 30 MG 24 hr tablet Commonly known as: IMDUR Take 1 tablet (30 mg total) by mouth daily.   Jardiance 10 MG Tabs tablet Generic drug: empagliflozin Take 1 tablet (10 mg total) by mouth daily.   ketotifen 0.025 % ophthalmic solution Commonly known as: ZADITOR Place 1 drop into both eyes every morning.   metFORMIN 1000 MG tablet Commonly known as: GLUCOPHAGE Take 1,000 mg by mouth 2 (two) times daily with a meal.   multivitamin with minerals Tabs tablet Take 1 tablet by mouth daily.   Omega 3 1200 MG Caps Take 1,200 mg by mouth daily.   ProAir HFA 108 (90 Base) MCG/ACT inhaler Generic drug: albuterol Inhale 2 puffs into the lungs every 6 (six) hours as needed for wheezing or shortness of breath.   Stiolto Respimat 2.5-2.5 MCG/ACT Aers Generic drug: Tiotropium Bromide-Olodaterol Inhale 2 puffs into the lungs daily.               Durable Medical Equipment  (From admission, onward)           Start     Ordered   12/17/20 1405  For home use only DME oxygen  Once       Question Answer Comment  Length of Need 6 Months   Mode or (Route) Nasal cannula   Liters per Minute 4   Frequency Continuous (stationary and portable oxygen unit needed)  Oxygen conserving device Yes   Oxygen delivery system Gas      12/17/20 1405            Disposition and follow-up:   Mr.Boniface Lyda [facility]oneKirk [facility] St[facility].  At the hospital follow up visit please address:  1.  Follow-up:  a.  HFrEF-medication adherence, edema    b.  Nephrotic syndrome-follow-up nephrologist   c.  Ischemic cardiomyopathy-medication adherence   d.  OSA-CPAP adherence   e. Pulmonary sarcoidosis - F/U pulmonologist   f.  Substance cessation-follow-up alcohol and tobacco use  2.  Labs / imaging needed at time of follow-up: CBC, BMP, mag  3.  Pending labs/ test needing follow-up: Anti-PLA 2  4.  Medication  Changes  Started: Sherryll BurgerEntresto, Farxiga, Imdur, hydralazine, losartan, lasix  Stopped: Hyzaar, amlodipine  Changed: Atorvastatin increased  Abx -none  Follow-up Appointments:  Follow-up Information     Lennette BihariKelly, Thomas A, MD Follow up on 12/23/2020.   Specialty: Cardiology Contact information: [address] [place] [address] [phone]         [facility] Follow up.   Why: [facility]- will provide oxygen to the room prior to transiiton home. If you need to contact the office please call [phone].                Hospital Course by problem list:  Acute on chronic HFrEF exacerbation Ischemic cardiomyopathy Mr. Lyda PeroneKirk presented to the emergency department with acute shortness of breath as well as anasarca.  On physical exam patient had JVD, bilateral lower lobe crackles, lower extremity edema 4+.  Consistent with heart failure.  A repeat echocardiogram was performed patient was found to have EF of 30 to 35% with moderately decreased left ventricular function.  He was diuresed with IV Lasix with total I's and O's of -14 L.  He was down around 20 pounds at discharge.  While being diuresed he was medically optimized with carvedilol, Entresto, hydralazine, Imdur.  He was also discharged home with Lasix.  He was taken for a right as well as left heart catheterization.  He was found to have multivessel disease and occluded RCA with collateral PDA nothing was amenable to PCI and so medical management was continued.  Patient had significant improvement of his oxygen supplementation.  On discharge he was requiring 4 L with ambulation to keep his oxygen saturation above 88%.  Patient will follow up with the heart failure team, he has an appointment scheduled for 12/23/2020.  TOC assisting patient with medications prior to discharge.  CKD 3A Nephrotic syndrome Patient with history of CKD 3A Baseline creatinine of 1.5-1.6.  During his admission his creatinine stayed around  this range.  He was found to have proteinuria on his urinalysis.  His protein to creatinine ratio was 3.1, the nephrotic range.  It appeared as though if his nephrologist had performed an extensive work-up for this.  Including hepatitis B, hepatitis C, ANA, SPEP and UPEP negative for M spike, ANCA negative, C3 179, C4 47.  An anti-PLA 2 was ordered and is pending at discharge.  Patient was not started on preoperative anticoagulation, per KDIGO  guidelines his albumin was 1.6.  Patient diuresed well with Lasix had no progression of his CKD on the heart failure medications started as per above.  Patient will need further work-up and monitoring.  Recommended patient follow-up with his outpatient nephrologist.  Suspect patient will need a kidney biopsy for further evaluation  of his nephrotic syndrome.  Pulmonary hypertension Pulmonary sarcoidosis History of COPD Patient with history of COPD and pulmonary sarcoidosis.  Right heart catheterization showed elevated pulmonary pressures, thought to be group 3 related to OSA and pulmonary sarcoidosis.  Patient is nonadherent to his CPAP machine at home.  Patient will need to follow-up with his outpatient pulmonologist.  Liver changes on imaging Patient with liver changes on ultrasound as well as changes in liver function test.  His INR was normal.  Albumin was low, suspect this secondary to his nephrotic syndrome.  Discussed with patient alcohol use leading to decreased as can eventually lead to cirrhosis.  Patient knowledge understanding.  He will need this followed closely with his primary care provider.  Discharge Subjective: Mr. Hockett states he feels better overall and is ready to be discharged home.  Feels as though the swelling continues to decrease and that his breathing continues to improve.  He has follow-up with cardiology team.  Recommended he follow-up with his PCP as well as nephrologist.  Mr. Guin had no other questions at discharge.  Discharge Exam:    BP (!) 154/94 (BP Location: Left Arm)   Pulse 87   Temp 97.7 F (36.5 C) (Axillary)   Resp 18   Ht  (1.727 m)   Wt 90.9 kg   SpO2 97%   BMI 30.49 kg/m  Constitutional: Well-appearing, no acute distress HENT: normocephalic atraumatic, mucous membranes moist Eyes: conjunctiva non-erythematous Neck: supple Cardiovascular: regular rate and rhythm, no m/r/g Pulmonary/Chest: normal work of breathing on room air Abdominal: soft, non-tender, non-distended MSK: normal bulk and tone Neurological: alert & oriented x 3 Skin: warm and dry.  1+ pitting edema lower extremities Psych: Flat affect  Pertinent Labs, Studies, and Procedures:  CBC Latest Ref Rng & Units 12/15/2020 12/14/2020 12/14/2020  WBC 4.0 - 10.5 K/uL 7.3 6.7 -  Hemoglobin 13.0 - 17.0 g/dL 1.6(X) 10.0(L) 11.2(L)  Hematocrit 39.0 - 52.0 % 33.1(L) 35.2(L) 33.0(L)  Platelets 150 - 400 K/uL 233 241 -    CMP Latest Ref Rng & Units 12/16/2020 12/15/2020 12/14/2020  Glucose 70 - 99 mg/dL 096(E) 454(U) -  BUN 8 - 23 mg/dL 98(J) 19(J) -  Creatinine 0.61 - 1.24 mg/dL 4.78(G) 9.56(O) -  Sodium 135 - 145 mmol/L 133(L) 138 142  Potassium 3.5 - 5.1 mmol/L 3.6 4.0 4.0  Chloride 98 - 111 mmol/L 96(L) 100 -  CO2 22 - 32 mmol/L 30 33(H) -  Calcium 8.9 - 10.3 mg/dL 9.4 13.0 -  Total Protein 6.5 - 8.1 g/dL - - -  Total Bilirubin 0.3 - 1.2 mg/dL - - -  Alkaline Phos 38 - 126 U/L - - -  AST 15 - 41 U/L - - -  ALT 0 - 44 U/L - - -    DG Chest 2 View  Result Date: 12/08/2020 CLINICAL DATA:  Lower extremity edema and dyspnea for 2 EXAM: CHEST - 2 VIEW COMPARISON:  06/03/2014 chest radiograph. FINDINGS: Stable cardiomediastinal silhouette with mild cardiomegaly. No pneumothorax. No pleural effusion. Mild diffuse hazy and linear parahilar lung opacities, new, favor mild-to-moderate pulmonary edema. IMPRESSION: Mild-to-moderate congestive heart failure. Electronically Signed   By: Delbert Phenix M.D.   On: 12/08/2020 15:48   US Abdomen  Complete  Result Date: 12/08/2020 CLINICAL DATA:  65 year old male with cirrhosis and chronic kidney disease. EXAM: ABDOMEN ULTRASOUND COMPLETE COMPARISON:  None. FINDINGS: Gallbladder: No gallstone. Mildly thickened gallbladder wall, likely related to underlying liver disease and ascites. Negative sonographic  Murphy's sign. Common bile duct: Diameter: 7 mm Liver: Morphologic changes of cirrhosis. Portal vein is patent on color Doppler imaging with normal direction of blood flow towards the liver. IVC: No abnormality visualized. Pancreas: Visualized portion unremarkable. Spleen: Size and appearance within normal limits. Right Kidney: Length: 11 cm. There is mild increased renal echogenicity. There is a 14 mm interpolar cyst. No hydronephrosis or shadowing stone Left Kidney: Length: 11 mm. Mildly increased echogenicity. No hydronephrosis or shadowing stone. Abdominal aorta: No aneurysm visualized. Other findings: None. IMPRESSION: 1. Cirrhosis. 2. Small ascites. 3. Patent main portal vein with hepatopetal flow. 4. Echogenic kidneys in keeping with chronic kidney disease. No hydronephrosis or shadowing stone. Electronically Signed   By: Elgie Collard M.D.   On: 12/08/2020 22:30   ECHOCARDIOGRAM COMPLETE  Result Date: 12/09/2020    ECHOCARDIOGRAM REPORT   Patient Name:   Rashaun Wichert Date of Exam: 12/09/2020 Medical Rec #:  [id number]    Height:       68.0 in Accession #:    [id number]   Weight:       223.5 lb Date of Birth:  1955/09/09    BSA:          2.143 m Patient Age:    64 years     BP:           169/94 mmHg Patient Gender: M            HR:           71 bpm. Exam Location:  Inpatient Procedure: 2D Echo, Cardiac Doppler and Color Doppler                     STAT ECHO Reported to: Lina Sayre on 12/09/2020 8:28:00 AM. Indications:    CHF  History:        Patient has no prior history of Echocardiogram examinations.                 Risk Factors:Diabetes and Hypertension.  Sonographer:    Neomia Dear RDCS  Referring Phys: BENJAMIN COE IMPRESSIONS  1. Left ventricular ejection fraction, by estimation, is 30 to 35%. The left ventricle has moderately decreased function. The left ventricle has no regional wall motion abnormalities. Left ventricular diastolic parameters are indeterminate.  2. The RV wall motion is not seen as well as we would like but there is contractility of the apex with reduced contractility of the mid RV and base ( McConnels Sign)     Consider evaluation for pulmonary embolus is clinically indicated.     . Right ventricular systolic function is mildly reduced. The right ventricular size is moderately enlarged. There is mildly elevated pulmonary artery systolic pressure. The estimated right ventricular systolic pressure is 38.8 mmHg.  3. Right atrial size was mildly dilated.  4. The mitral valve is grossly normal. Mild mitral valve regurgitation.  5. Tricuspid valve regurgitation is mild to moderate.  6. The aortic valve is grossly normal. Aortic valve regurgitation is not visualized. Mild aortic valve stenosis. FINDINGS  Left Ventricle: Left ventricular ejection fraction, by estimation, is 30 to 35%. The left ventricle has moderately decreased function. The left ventricle has no regional wall motion abnormalities. The left ventricular internal cavity size was normal in size. There is no left ventricular hypertrophy. Left ventricular diastolic parameters are indeterminate. Right Ventricle: The RV wall motion is not seen as well as we would like but there is contractility of the apex with reduced contractility of  the mid RV and base ( McConnels Sign) Consider evaluation for pulmonary embolus is clinically indicated. The right ventricular size is moderately enlarged. Right vetricular wall thickness was not well visualized. Right ventricular systolic function is mildly reduced. There is mildly elevated pulmonary artery systolic pressure. The tricuspid regurgitant velocity is 2.99 m/s, and with an assumed  right atrial pressure of 3 mmHg, the estimated right ventricular systolic pressure is 38.8 mmHg. Left Atrium: Left atrial size was normal in size. Right Atrium: Right atrial size was mildly dilated. Pericardium: There is no evidence of pericardial effusion. Mitral Valve: The mitral valve is grossly normal. Mild mitral valve regurgitation. Tricuspid Valve: The tricuspid valve is normal in structure. Tricuspid valve regurgitation is mild to moderate. Aortic Valve: The aortic valve is grossly normal. Aortic valve regurgitation is not visualized. Mild aortic stenosis is present. Aortic valve mean gradient measures 4.0 mmHg. Aortic valve peak gradient measures 8.5 mmHg. Aortic valve area, by VTI measures 1.96 cm. Pulmonic Valve: The pulmonic valve was grossly normal. Pulmonic valve regurgitation is not visualized. Aorta: The aortic root and ascending aorta are structurally normal, with no evidence of dilitation. IAS/Shunts: The atrial septum is grossly normal.  LEFT VENTRICLE PLAX 2D LVIDd:         6.10 cm     Diastology LVIDs:         4.20 cm     LV e' medial:    5.43 cm/s LV PW:         1.20 cm     LV E/e' medial:  21.4 LV IVS:        1.50 cm     LV e' lateral:   5.56 cm/s LVOT diam:     2.10 cm     LV E/e' lateral: 20.9 LV SV:         54 LV SV Index:   25 LVOT Area:     3.46 cm  LV Volumes (MOD) LV vol d, MOD A2C: 98.3 ml LV vol d, MOD A4C: 71.9 ml LV vol s, MOD A2C: 61.4 ml LV SV MOD A2C:     36.9 ml LV SV MOD A4C:     71.9 ml RIGHT VENTRICLE RV Basal diam:  4.10 cm RV Mid diam:    2.30 cm RV S prime:     9.75 cm/s TAPSE (M-mode): 1.6 cm LEFT ATRIUM             Index       RIGHT ATRIUM           Index LA Vol (A2C):   65.3 ml 30.48 ml/m RA Area:     22.20 cm LA Vol (A4C):   63.8 ml 29.77 ml/m RA Volume:   73.00 ml  34.07 ml/m LA Biplane Vol: 64.7 ml 30.19 ml/m  AORTIC VALVE                   PULMONIC VALVE AV Area (Vmax):    1.95 cm    PV Vmax:       0.91 m/s AV Area (Vmean):   1.84 cm    PV Vmean:       62.100 cm/s AV Area (VTI):     1.96 cm    PV VTI:        0.192 m AV Vmax:           146.00 cm/s PV Peak grad:  3.3 mmHg AV Vmean:          96.500  cm/s PV Mean grad:  2.0 mmHg AV VTI:            0.277 m AV Peak Grad:      8.5 mmHg AV Mean Grad:      4.0 mmHg LVOT Vmax:         82.00 cm/s LVOT Vmean:        51.400 cm/s LVOT VTI:          0.157 m LVOT/AV VTI ratio: 0.57  AORTA Ao Root diam: 3.90 cm Ao Asc diam:  3.40 cm MITRAL VALVE                 TRICUSPID VALVE MV Area (PHT): 6.27 cm      TR Peak grad:   35.8 mmHg MV Decel Time: 121 msec      TR Vmax:        299.00 cm/s MR Peak grad:    52.4 mmHg MR Vmax:         362.00 cm/s SHUNTS MR PISA:         0.25 cm    Systemic VTI:  0.16 m MR PISA Eff ROA: 2 mm       Systemic Diam: 2.10 cm MR PISA Radius:  0.20 cm MV E velocity: 116.00 cm/s MV A velocity: 79.80 cm/s MV E/A ratio:  1.45 Kristeen Miss MD Electronically signed by Kristeen Miss MD Signature Date/Time: 12/09/2020/9:08:26 AM    Final      Discharge Instructions: Discharge Instructions     Call MD for:   Complete by: As directed    Call MD for:  difficulty breathing, headache or visual disturbances   Complete by: As directed    Call MD for:  extreme fatigue   Complete by: As directed    Call MD for:  hives   Complete by: As directed    Call MD for:  persistant dizziness or light-headedness   Complete by: As directed    Call MD for:  persistant nausea and vomiting   Complete by: As directed    Call MD for:  redness, tenderness, or signs of infection (pain, swelling, redness, odor or green/yellow discharge around incision site)   Complete by: As directed    Call MD for:  severe uncontrolled pain   Complete by: As directed    Call MD for:  temperature >100.4   Complete by: As directed    Diet - low sodium heart healthy   Complete by: As directed    Increase activity slowly   Complete by: As directed        Signed: Belva Agee, MD 12/17/2020, 2:57 PM   Pager:  [phone]

## 2020-12-17 DIAGNOSIS — N1831 Chronic kidney disease, stage 3a: Secondary | ICD-10-CM

## 2020-12-17 LAB — GLUCOSE, CAPILLARY
Glucose-Capillary: 175 mg/dL — ABNORMAL HIGH (ref 70–99)
Glucose-Capillary: 178 mg/dL — ABNORMAL HIGH (ref 70–99)
Glucose-Capillary: 103 mg/dL — ABNORMAL HIGH (ref 70–99)

## 2020-12-17 NOTE — Care Management (Signed)
1437 12-17-20 Case Manager spoke with the patient regarding home oxygen. 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 Patient is agreeable for the Case Manager to contact [facility] for oxygen. Referral made to [facility] with [facility]. Order written for 02 with conserving device. Liaison will work with the patients primary care provider to see if the patient can benefit from a smaller oxygen concentrator. No further needs identified from Case Manager at this time.

## 2020-12-17 NOTE — Plan of Care (Signed)
?  Problem: Clinical Measurements: ?Goal: Will remain free from infection ?Outcome: Progressing ?  ?

## 2020-12-17 NOTE — Progress Notes (Signed)
SATURATION QUALIFICATIONS: (This note is used to comply with regulatory documentation for home oxygen)  Patient Saturations on Room Air at Rest = 84%  Patient Saturations on Room Air while Ambulating = 80%  Patient Saturations on 4 Liters of oxygen while Ambulating = 93%  Please briefly explain why patient needs home oxygen:

## 2020-12-17 NOTE — Progress Notes (Signed)
Progress Note  Patient Name: Charles Krueger Date of Encounter: 12/17/2020  [facility]ologist: [facility]res, new to Dr. Tresa Endo  Subjective   Net negative 2.8L yesterday on p.o. Lasix 80 mg x 1 and IV Lasix 80 mg x 1, -15 L on admission.  Creatinine stable at 1.6.  Weight is down 4 pounds from yesterday, down 32 pounds on admission.  Reports dyspnea has resolved  Inpatient Medications    Scheduled Meds:  arformoterol  15 mcg Nebulization BID   And   umeclidinium bromide  1 puff Inhalation Daily   aspirin  81 mg Oral Daily   atorvastatin  40 mg Oral Daily   carvedilol  25 mg Oral BID WC   empagliflozin  10 mg Oral Daily   enoxaparin (LOVENOX) injection  40 mg Subcutaneous Q24H   furosemide  80 mg Oral BID   hydrALAZINE  37.5 mg Oral Q8H   ipratropium  1 spray Each Nare Daily   isosorbide mononitrate  30 mg Oral Daily   sacubitril-valsartan  1 tablet Oral BID   sodium chloride flush  3 mL Intravenous Q12H   sodium chloride flush  3 mL Intravenous Q12H   Continuous Infusions:  sodium chloride     PRN Meds: sodium chloride, acetaminophen **OR** acetaminophen, albuterol, ondansetron (ZOFRAN) IV, sodium chloride flush   Vital Signs    Vitals:   12/16/20 2100 12/17/20 0500 12/17/20 0900 12/17/20 0908  BP: 128/68 (!) 149/98 (!) 154/94   Pulse: 83 75 87   Resp: 18 18 18    Temp: 99 F (37.2 C) 99.2 F (37.3 C) 97.7 F (36.5 C)   TempSrc: Oral Oral Axillary   SpO2: 90% 92%  97%  Weight:  90.9 kg    Height:        Intake/Output Summary (Last 24 hours) at 12/17/2020 1117 Last data filed at 12/17/2020 0857 Gross per 24 hour  Intake 527.6 ml  Output 2925 ml  Net -2397.4 ml    Last 3 Weights 12/17/2020 12/16/2020 12/15/2020  Weight (lbs) 200 lb 8 oz 204 lb 6.4 oz 206 lb 12.8 oz  Weight (kg) 90.946 kg 92.715 kg 93.804 kg      Telemetry    Sinus rhythm HR 80-90s, PVCs - Personally Reviewed  ECG    No new tracings - Personally Reviewed  Physical Exam    GEN: No acute distress.   Neck: No JVD Cardiac: RRR, no murmurs, rubs, or gallops.  Respiratory: Clear to auscultation bilaterally. GI: Soft, nontender, non-distended  MS: lower extremity edema, tense below knee Neuro:  Nonfocal  Psych: Normal affect   Labs    High Sensitivity Troponin:   Recent Labs  Lab 12/08/20 1453 12/08/20 1653  TROPONINIHS 16 15       Chemistry Recent Labs  Lab 12/14/20 0224 12/14/20 1032 12/14/20 1039 12/15/20 0154 12/16/20 0422  NA 135   < > 142  142 138 133*  K 4.7   < > 4.0  4.0 4.0 3.6  CL 100  --   --  100 96*  CO2 27  --   --  33* 30  GLUCOSE 126*  --   --  164* 128*  BUN 26*  --   --  26* 25*  CREATININE 1.66*  --   --  1.72* 1.63*  CALCIUM 10.2  --   --  10.0 9.4  GFRNONAA 46*  --   --  44* 47*  ANIONGAP 8  --   --  5 7   < > = values in this interval not displayed.      Hematology Recent Labs  Lab 12/12/20 0450 12/14/20 1032 12/14/20 1039 12/14/20 1332 12/15/20 0154  WBC 6.5  --   --  6.7 7.3  RBC 4.16*  --   --  4.28 4.07*  HGB 9.6*   < > 11.2*  11.2* 10.0* 9.8*  HCT 34.3*   < > 33.0*  33.0* 35.2* 33.1*  MCV 82.5  --   --  82.2 81.3  MCH 23.1*  --   --  23.4* 24.1*  MCHC 28.0*  --   --  28.4* 29.6*  RDW 18.5*  --   --  20.0* 20.2*  PLT 232  --   --  241 233   < > = values in this interval not displayed.     BNPNo results for input(s): BNP, PROBNP in the last 168 hours.   DDimer No results for input(s): DDIMER in the last 168 hours.   Radiology    No results found.  Cardiac Studies   Left heart cath 12/14/20: Hemodynamic findings consistent with Moderate Pulmonary Hypertension. -> Mean PAP 43 mmHg, LVEDP/PCWP 18-21 mmHg Ost RCA to Prox RCA lesion is 50% stenosed. Prox RCA lesion is 70% stenosed. RV Branch lesion is 55% stenosed. Mid RCA to Dist RCA lesion is 100% stenosed. RPAV lesion is 100% stenosed. (The distal RPDA fills via collaterals from RV marginal, mild collaterals from left to right to the  PL system) Prox LAD to Mid LAD lesion is 45% stenosed. 3rd Diag lesion is 50% stenosed. Ramus lesion is 60% stenosed. Discrete having focal lesion Mid Cx to Dist Cx lesion is 55% stenosed.   SUMMARY Severe Single-Vessel Disease with Moderate diffuse Multivessel Disease -50% ostial RCA followed by 70% proximal RCA after major RVM then mid RCA 100% CTO with bridging collaterals and R-R collaterals from RV M filling the distal PDA.  (Small caliber distal vessels) -Discrete napkin ring concentric 60 to 65% ostial RI -Diffuse 40 to 50% proximal to mid LAD, proximal small D3 and extensive 40 to 60% LCx-OM1   Moderate Combined Pulmonary Hypertension-73/23 mmHg with mean PAP 40 to 43 mmHg (Transpulmonary Gradient 20 to 22 mmHg).  LVEDP 21 mmHg with PCWP 18 mmHg; mean RAP 15 mmHg. Mildly Reduced Cardiac Index: (Fick) Cardiac Output 5.79, Index 2.75     RECOMMENDATIONS Recommend medical management for existing CAD Continue diuresis although left-sided filling pressures are not significantly elevated--May benefit from pulmonary Inodilator (milrinone)   ______________________  Echo 12/09/20:  1. Left ventricular ejection fraction, by estimation, is 30 to 35%. The  left ventricle has moderately decreased function. The left ventricle has  no regional wall motion abnormalities. Left ventricular diastolic  parameters are indeterminate.   2. The RV wall motion is not seen as well as we would like but there is  contractility of the apex with reduced contractility of the mid RV and  base ( McConnels Sign)      Consider evaluation for pulmonary embolus is clinically indicated.      . Right ventricular systolic function is mildly reduced. The right  ventricular size is moderately enlarged. There is mildly elevated  pulmonary artery systolic pressure. The estimated right ventricular  systolic pressure is 38.8 mmHg.   3. Right atrial size was mildly dilated.   4. The mitral valve is grossly normal. Mild  mitral valve regurgitation.   5. Tricuspid valve regurgitation is mild to moderate.  6. The aortic valve is grossly normal. Aortic valve regurgitation is not  visualized. Mild aortic valve stenosis.     Patient Profile     65 y.o. 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  Mild LE edema on exam but otherwise appears euvolemic, no JVD.  He is adamant about discharge today, would plan to discharge on p.o. Lasix 80 mg twice daily.  Needs BMET in 1 week.   CAD - cath with severe single vessel disease and moderate diffuse multivessel disease - cath results as above - medical therapy was recommended - continue ASA, 40 mg lipitor, BB - no chest pain  Hyperlipidemia - lipitor increased from 20 to 40 mg   CKD stage IIIa Nephrotic syndrome Hypoalbuminemia - renal function stable - additional IV lasix today, add jardiance  Cirrhosis - per abdominal US 12/08/20  COPD - remains on supplemental O2 - will need walking sats prior to discharge  [facility] will sign off.   Medication  Recommendations: Lasix 80 mg twice daily, Jardiance 10 mg daily, Imdur 30 mg daily, hydralazine 37.5 mg 3 times daily, atorvastatin 40 mg daily, aspirin 81 mg daily, Entresto 49-51 mg twice daily, Coreg 25 mg twice daily, potassium supplement per primary team Other recommendations (labs, testing, etc): BMET within 1 week Follow up as an outpatient: Follow-up scheduled on 7/1  For questions or updates, please contact [facility] Please consult [email or web address] for contact info under        Signed, Little Ishikawa, MD  12/17/2020, 11:17 AM

## 2020-12-19 LAB — MISC LABCORP TEST (SEND OUT): Labcorp test code: 141330

## 2020-12-20 NOTE — Progress Notes (Addendum)
[facility] Transitions of Care Clinic  PCP: none Primary Cardiologist: [facility] Sen[facility]res, [facility] Nicki Guadalajara  HPI:  Charles Krueger is a 65 y.o.  male  with a PMH significant for pulmonary sarcoidosis, type 2 diabetes, non-oxygen dependent COPD, OSA, EtOH abuse, history of pancreatitis with pseudocyst and abscess, gout, adrenal adenoma, hypertension, hyperlipidemia,CAD, CKD stage III, cirrhosis, nephrotic syndrome   Per chart review LHC in 2010 which revealed EF 45% with global hypokinesis,  RCA 60 to 70% stenosis but nothing amendable to PCI.   He started developing shortness of breath several months ago and was admitted initially at [facility] in April 2022.   He had an ECHO with EF 50-55%, G2DD, RVSP estimation >60.  He left before being discharged.  Only diuretic he had was thiazide.    He presented to [facility] on 12/08/2020 for progressively worsening shortness of breath with exertion, lower extremity edema up to bilateral thighs, abdominal bloating, activity intolerance, orthopnea, PND.  He reported alcohol use disorder history with history of heavy drinking for >30 years until 2010 but still drinking 1-2 shots of liquor 4-5x per week.  CXR revealed mild to moderate pulmonary edema.  BNP was1213.  CMP revealed elevated creatinine 1.72, albumin 2.6, elevated alkaline phos 348, and GFR 44.  Hs troponin negative x2. EKG revealed sinus rhythm with PAC, rate  97 , T wave flattening of V3-V6. He was started on IV lasix.  Had a repeat echo 12/09/20 EF 30 to 35%, no RWMA,  R RV systolic function mildly reduced, RV size moderately enlarged, mild to moderate TR, mild AS.  Underwent R/LHC:    PAP-Mean: 73/23 mmHg - 43 mmHg Transpulmonary Gradient 20-22 mmHg PCWP Elevated LV EDP consistent with volume overload. LVP-EDP: 142/8 mmHg - 21 mmHg  AoP-MAP 144/75 mmHg - 100 mmHg Ao sat 91%, PA sat 54% (Fick) Cardiac Output 5.79, Cardiac Index 2.75  Diuresis continued wt trend 224>200lbs.   GDMT titrated.    Reports he is monitored by pulmonology at [facility] for his sarcoidosis with yearly PFT's and chest imaging.  They tell him there has been no significant changes.    Has not had covid or recent viral symptoms.    Feels a lot better since hospitalization.  Dyspnea has improved significantly, shopping and working in the yard without getting dyspneic.  Going up and down stairs which he could not do before.  Weight down an additional 14lbs to 186.     ROS: All systems negative except as listed in HPI, PMH and Problem List.  SH:  Social History   Socioeconomic History   Marital status: Married    Spouse name: Not on file   Number of children: Not on file   Years of education: Not on file   Highest education level: Not on file  Occupational History   Not on file  Tobacco Use   Smoking status: Some Days    Pack years: 0.00    Types: Cigars    Start date: 2002   Smokeless tobacco: Current   Tobacco comments:    3-4 Cigars per week  Substance and Sexual Activity   Alcohol use: Yes    Alcohol/week: 6.0 - 7.0 standard drinks    Types: 6 - 7 Shots of liquor per week    Comment: 6-7 shots per week   Drug use: No   Sexual activity: Not on file  Other Topics Concern   Not on file  Social History Narrative  Not on file   Social Determinants of Health   Financial Resource Strain: Not on file  Food Insecurity: Not on file  Transportation Needs: Not on file  Physical Activity: Not on file  Stress: Not on file  Social Connections: Not on file  Intimate Partner Violence: Not on file    FH: History reviewed. No pertinent family history.  Past Medical History:  Diagnosis Date   Diabetes mellitus without complication (HCC)    HTN (hypertension)    OSA on CPAP     Current Outpatient Medications  Medication Sig Dispense Refill   albuterol (VENTOLIN HFA) 108 (90 Base) MCG/ACT inhaler Inhale 2 puffs into the lungs every 6 (six) hours as needed for wheezing or shortness  of breath.     allopurinol (ZYLOPRIM) 100 MG tablet Take 200 mg by mouth daily.     aspirin 81 MG tablet Take 162 mg by mouth at bedtime.     atorvastatin (LIPITOR) 40 MG tablet Take 1 tablet (40 mg total) by mouth daily. 30 tablet 0   carvedilol (COREG) 25 MG tablet Take 25 mg by mouth 2 (two) times daily with a meal.     empagliflozin (JARDIANCE) 10 MG TABS tablet Take 1 tablet (10 mg total) by mouth daily. 30 tablet 0   fluticasone (FLONASE) 50 MCG/ACT nasal spray Place 2 sprays into both nostrils daily.     furosemide (LASIX) 80 MG tablet Take 1 tablet (80 mg total) by mouth 2 (two) times daily. 30 tablet 0   hydrALAZINE (APRESOLINE) 25 MG tablet Take 1.5 tablets (37.5 mg total) by mouth every 8 (eight) hours. 135 tablet 0   ipratropium (ATROVENT) 0.03 % nasal spray Place 2 sprays into both nostrils daily.     isosorbide mononitrate (IMDUR) 30 MG 24 hr tablet Take 1 tablet (30 mg total) by mouth daily. 30 tablet 0   ketotifen (ZADITOR) 0.025 % ophthalmic solution Place 1 drop into both eyes every morning.     metFORMIN (GLUCOPHAGE) 1000 MG tablet Take 1,000 mg by mouth 2 (two) times daily with a meal.     Multiple Vitamin (MULTIVITAMIN WITH MINERALS) TABS tablet Take 1 tablet by mouth daily.     Omega 3 1200 MG CAPS Take 1,200 mg by mouth daily.     OXYGEN Inhale 2 L into the lungs as needed.     sacubitril-valsartan (ENTRESTO) 49-51 MG Take 1 tablet by mouth 2 (two) times daily. 60 tablet 0   Tiotropium Bromide-Olodaterol (STIOLTO RESPIMAT) 2.5-2.5 MCG/ACT AERS Inhale 2 puffs into the lungs daily.     No current facility-administered medications for this encounter.    Vitals:   12/23/20 1105  BP: 108/76  Pulse: 90  SpO2: 93%  Weight: 84.7 kg (186 lb 12.8 oz)    PHYSICAL EXAM: Cardiac: JVD 8, normal rate and rhythm, clear s1 and s2, no murmurs, rubs or gallops, 1+ LE edema bilaterally Pulmonary: faint basilar rales, not in distress Abdominal: non distended abdomen, soft and  nontender Psych: Alert, conversant, in good spirits   ASSESSMENT & PLAN: Chronic Combined systolic and diastolic heart failure Biventricular CHF - 11/2020 EF 30-35%, RV with reduced contractility of the mid RV and base  - right and left heart cath with CAD and PH as above - NYHA Class II symptoms, mildly volume up on exam, REDS 38% weight down 38lbs since admission - Continue entresto 49/51, Carvedilol 25mg  BID, Bidil equivalent 1tab TID, Jardiance 10, - Decrease lasix to 80mg  PRN from 80mg   BID - add spiro 12.5mg   - labs today, repeat bmp and visit with me in one week   - Now that he is decongested will reevaluate with cardiac MRI given sarcoid and RV dysfunction - Given multiorgan dysfunction may benefit from rheumatology follow up  Mixed pulmonary venous and arterial hypertension Pulmonary sarcoid -[facility]'s and CT scans -encouraged cpap compliance -may need to get V/Q scan going forward   CAD - cath with severe single vessel disease and moderate diffuse multivessel disease - cath results as above - medical therapy was recommended - continue ASA, 40 mg lipitor, BB - no s/s of chest pain   CKD stage IIIa Nephrotic syndrome Hypoalbuminemia - repeat bmp - continue jardiance, Entresto - add spiro - decrease lasix as outlined above   Cirrhosis: - noted on abdominal US 12/08/20 - encouraged complete alcohol cessation - PCP will need to follow    COPD: -on daily LABA/LAMA therapy -continue pulmonology follow up  Iron Deficiency Anemia: -s/p one dose of feraheme during recent hospitalization -repeat cbc  -we will arrange for second dose of IV feraheme -PCP to manage colon cancer screening   Follow up with me in one week then establish with AHF

## 2020-12-22 ENCOUNTER — Telehealth (HOSPITAL_COMMUNITY): Payer: Self-pay | Admitting: Surgery

## 2020-12-22 NOTE — Telephone Encounter (Signed)
I called to remind patient of upcoming appt in the [facility] clinic tomorrow. No answer and no ability to leave a message.

## 2020-12-23 ENCOUNTER — Other Ambulatory Visit (HOSPITAL_COMMUNITY): Payer: Self-pay

## 2020-12-23 ENCOUNTER — Encounter (HOSPITAL_COMMUNITY): Payer: Self-pay

## 2020-12-23 ENCOUNTER — Other Ambulatory Visit: Payer: Self-pay

## 2020-12-23 ENCOUNTER — Ambulatory Visit (HOSPITAL_COMMUNITY)
Admit: 2020-12-23 | Discharge: 2020-12-23 | Disposition: A | Discharge status: Home / Self Care | Payer: No Typology Code available for payment source | Attending: Internal Medicine | Admitting: Internal Medicine

## 2020-12-23 VITALS — BP 108/76 | HR 90 | Wt 186.8 lb

## 2020-12-23 DIAGNOSIS — D509 Iron deficiency anemia, unspecified: Secondary | ICD-10-CM

## 2020-12-23 DIAGNOSIS — E1121 Type 2 diabetes mellitus with diabetic nephropathy: Secondary | ICD-10-CM | POA: Insufficient documentation

## 2020-12-23 DIAGNOSIS — Z79899 Other long term (current) drug therapy: Secondary | ICD-10-CM | POA: Insufficient documentation

## 2020-12-23 DIAGNOSIS — I5042 Chronic combined systolic (congestive) and diastolic (congestive) heart failure: Secondary | ICD-10-CM | POA: Diagnosis not present

## 2020-12-23 DIAGNOSIS — I2510 Atherosclerotic heart disease of native coronary artery without angina pectoris: Secondary | ICD-10-CM | POA: Insufficient documentation

## 2020-12-23 DIAGNOSIS — E785 Hyperlipidemia, unspecified: Secondary | ICD-10-CM | POA: Insufficient documentation

## 2020-12-23 DIAGNOSIS — G4733 Obstructive sleep apnea (adult) (pediatric): Secondary | ICD-10-CM | POA: Diagnosis not present

## 2020-12-23 DIAGNOSIS — N1832 Chronic kidney disease, stage 3b: Secondary | ICD-10-CM

## 2020-12-23 DIAGNOSIS — I2721 Secondary pulmonary arterial hypertension: Secondary | ICD-10-CM | POA: Insufficient documentation

## 2020-12-23 DIAGNOSIS — E1122 Type 2 diabetes mellitus with diabetic chronic kidney disease: Secondary | ICD-10-CM | POA: Insufficient documentation

## 2020-12-23 DIAGNOSIS — K7460 Unspecified cirrhosis of liver: Secondary | ICD-10-CM | POA: Diagnosis not present

## 2020-12-23 DIAGNOSIS — Z7984 Long term (current) use of oral hypoglycemic drugs: Secondary | ICD-10-CM | POA: Diagnosis not present

## 2020-12-23 DIAGNOSIS — D860 Sarcoidosis of lung: Secondary | ICD-10-CM

## 2020-12-23 DIAGNOSIS — I420 Dilated cardiomyopathy: Secondary | ICD-10-CM | POA: Diagnosis not present

## 2020-12-23 DIAGNOSIS — E8809 Other disorders of plasma-protein metabolism, not elsewhere classified: Secondary | ICD-10-CM | POA: Diagnosis not present

## 2020-12-23 DIAGNOSIS — J449 Chronic obstructive pulmonary disease, unspecified: Secondary | ICD-10-CM | POA: Insufficient documentation

## 2020-12-23 DIAGNOSIS — N1831 Chronic kidney disease, stage 3a: Secondary | ICD-10-CM | POA: Diagnosis not present

## 2020-12-23 DIAGNOSIS — I130 Hypertensive heart and chronic kidney disease with heart failure and stage 1 through stage 4 chronic kidney disease, or unspecified chronic kidney disease: Secondary | ICD-10-CM | POA: Diagnosis not present

## 2020-12-23 DIAGNOSIS — F17290 Nicotine dependence, other tobacco product, uncomplicated: Secondary | ICD-10-CM | POA: Diagnosis not present

## 2020-12-23 LAB — CBC
HCT: 41.8 % (ref 39.0–52.0)
Hemoglobin: 12.1 g/dL — ABNORMAL LOW (ref 13.0–17.0)
MCH: 24.3 pg — ABNORMAL LOW (ref 26.0–34.0)
MCHC: 28.9 g/dL — ABNORMAL LOW (ref 30.0–36.0)
MCV: 84.1 fL (ref 80.0–100.0)
Platelets: 289 10*3/uL (ref 150–400)
RBC: 4.97 MIL/uL (ref 4.22–5.81)
RDW: 24.6 % — ABNORMAL HIGH (ref 11.5–15.5)
WBC: 7.4 10*3/uL (ref 4.0–10.5)
nRBC: 0 % (ref 0.0–0.2)

## 2020-12-23 LAB — BASIC METABOLIC PANEL
Anion gap: 9 (ref 5–15)
BUN: 27 mg/dL — ABNORMAL HIGH (ref 8–23)
CO2: 28 mmol/L (ref 22–32)
Calcium: 10.1 mg/dL (ref 8.9–10.3)
Chloride: 101 mmol/L (ref 98–111)
Creatinine, Ser: 2.03 mg/dL — ABNORMAL HIGH (ref 0.61–1.24)
GFR, Estimated: 36 mL/min — ABNORMAL LOW (ref >60)
Glucose, Bld: 123 mg/dL — ABNORMAL HIGH (ref 70–99)
Potassium: 4.5 mmol/L (ref 3.5–5.1)
Sodium: 138 mmol/L (ref 135–145)

## 2020-12-23 LAB — BRAIN NATRIURETIC PEPTIDE: B Natriuretic Peptide: 290.3 pg/mL — ABNORMAL HIGH (ref 0.0–100.0)

## 2020-12-23 MED ORDER — SPIRONOLACTONE 25 MG PO TABS: 12.5000 mg | ORAL_TABLET | Freq: Every day | ORAL | 3 refills | Status: DC

## 2020-12-23 MED ORDER — ISOSORBIDE MONONITRATE ER 30 MG PO TB24: 30.0000 mg | ORAL_TABLET | Freq: Every day | ORAL | 3 refills | Status: DC

## 2020-12-23 MED ORDER — SACUBITRIL-VALSARTAN 49-51 MG PO TABS: 1.0000 | ORAL_TABLET | Freq: Two times a day (BID) | ORAL | 3 refills | Status: DC

## 2020-12-23 MED ORDER — EMPAGLIFLOZIN 10 MG PO TABS: 10.0000 mg | ORAL_TABLET | Freq: Every day | ORAL | 3 refills | Status: AC

## 2020-12-23 MED ORDER — FUROSEMIDE 80 MG PO TABS
ORAL_TABLET | ORAL | 2 refills | Status: DC
  Filled 2020-12-23: qty 30, 30d supply, fill #0
  Filled 2020-12-23: qty 30, fill #0

## 2020-12-23 MED ORDER — SODIUM CHLORIDE 0.9 % IV SOLN: 510.0000 mg | Freq: Once | INTRAVENOUS | Status: DC

## 2020-12-23 MED ORDER — SPIRONOLACTONE 25 MG PO TABS
12.5000 mg | ORAL_TABLET | Freq: Every day | ORAL | 0 refills | Status: DC
  Filled 2020-12-23 (×2): qty 15, 30d supply, fill #0

## 2020-12-23 MED ORDER — HYDRALAZINE HCL 25 MG PO TABS: 37.5000 mg | ORAL_TABLET | Freq: Three times a day (TID) | ORAL | 3 refills | Status: DC

## 2020-12-23 MED ORDER — FUROSEMIDE 80 MG PO TABS: 80.0000 mg | ORAL_TABLET | ORAL | 2 refills | Status: DC | PRN

## 2020-12-23 NOTE — Patient Instructions (Addendum)
DECREASE Lasix to 80 mg as needed for weight gain or swelling START Spironolactone 12.5 mg, one half tab daily  Labs today We will only contact you if something comes back abnormal or we need to make some changes. Otherwise no news is good news!  Your physician recommends that you schedule a follow-up appointment in: 1 week with Dr Judee Clara have been referred to [facility]   Do the following things EVERYDAY: Weigh yourself in the morning before breakfast. Write it down and keep it in a log. Take your medicines as prescribed Eat low salt foods--Limit salt (sodium) to 2000 mg per day.  Stay as active as you can everyday Limit all fluids for the day to less than 2 liters  At the [facility], you and your health needs are our priority. As part of our continuing mission to provide you with exceptional heart care, we have created designated Provider Care Teams. These Care Teams include your primary Cardiologist (physician) and Advanced Practice Providers (APPs- Physician Assistants and Nurse Practitioners) who all work together to provide you with the care you need, when you need it.   You may see any of the following providers on your designated Care Team at your next follow up: Dr Arvilla Meres Dr Marca Ancona Dr Brandon Melnick, NP Robbie Lis, [place] Mikki Santee Karle Plumber, PharmD   Please be sure to bring in all your medications bottles to every appointment.     Marland Kitchen

## 2020-12-23 NOTE — Progress Notes (Signed)
[facility] Transitions of Care Clinic Heart Failure Pharmacist Encounter  PCP: Pcp, No PCP-Cardiologist: Nicki Guadalajara, MD  HPI:  65 yo M with PMH of pulmonary sarcoidosis, diabetes, COPD, and OSA. He [facility] with shortness of breath, LE edema, and abdominal distention. CXR with findings of mild to moderate congestive heart failure. An ECHO was done on 6/17 and found to have LVEF 30-35% and mildly reduced RV systolic function. Prior ECHO showed LVEF 50-55%. R/LHC done on 6/22 - showed severe single vessel disease with moderate diffuse multivessel disease along with moderate PH and preserved cardiac output. He was then discharged on 6/24.  Today, Charles Krueger presents to the Heart Failure TOC Clinic for follow up. He denies having shortness of breath, orthopnea/PND, lightheadedness or dizziness. His edema has greatly improved. ReDS 38% in clinic today. Has been checking weights at home and monitoring BP (before taking AM meds) and stable ~130/80. He has been following a fluid-restricted diet and limits to 1.5-2L per day. He is taking all medications as prescribed and uses a pill box.    HF Medications: Furosemide 80 mg BID Carvedilol 25 mg BID Entresto 49/51 mg BID Jardiance 10 mg daily Hydralazine 37.5 mg TID Imdur 30 mg daily  Has the patient been experiencing any side effects to the medications prescribed?  no  Does the patient have any problems obtaining medications due to transportation or finances?   No - has insurance through [facility]  Understanding of regimen: good Understanding of indications: good Potential of compliance: good Patient understands to avoid NSAIDs. Patient understands to avoid decongestants.   Pertinent Lab Values: Serum creatinine 2.03, BUN 27, Potassium 4.5, Sodium 138, BNP 290   Vital Signs: Weight: 186 lbs (discharge weight: 200 lbs) Blood pressure: 108/76  Heart rate: 90   Medication Assistance /  Insurance Benefits Check: Does the patient have prescription insurance?  Yes Type of insurance plan: [facility] + [facility]  Outpatient Pharmacy:  Current outpatient pharmacy: [facility] Was the [facility] pharmacy used to supply discharge medications? yes  If TOC pharmacy was used, were the refills transferred out to current pharmacy yet? no - sent new scripts to [facility] Is the patient willing to transition their outpatient pharmacy to utilize a [facility] outpatient pharmacy with or without mail order?   No  Assessment: 1) Chronic systolic CHF (EF 49-17%), due to ICM. NYHA class II symptoms. ReDS 38%. - Reduce lasix to 80 mg daily PRN - Continue carvedilol 25 mg BID - Continue Entresto 49/51 mg BID - Continue Jardiance 10 mg daily - Start spironolactone 12.5 mg daily. BMET today and in 1 week - Continue hydralazine 37.5 mg TID - Continue Imdur 30 mg daily  Plan: 1) Medication changes: - Reduce lasix to 80 mg daily PRN - Start spironolactone 12.5 mg daily. BMET today and in 1 week  2) Patient Assistance: - None pending - 90 day prescriptions sent to [facility] - Provided with HF booklet - Already has scale, BP cuff, pill box and pill splitter  3) Follow up: - Next appointment with HF TOC clinic in 1 week, then 3 week follow up with HF APP clinic  Sharen Hones, PharmD, BCPS Heart Failure Transitions of Care Clinic Pharmacist [phone]

## 2020-12-23 NOTE — Progress Notes (Signed)
   ReDS Vest / Clip - 12/23/20 1100       ReDS Vest / Clip   Station Marker C    Ruler Value 28.5    ReDS Value Range Moderate volume overload    ReDS Actual Value 38

## 2020-12-27 ENCOUNTER — Telehealth (HOSPITAL_COMMUNITY): Payer: Self-pay | Admitting: *Deleted

## 2020-12-27 NOTE — Telephone Encounter (Signed)
[facility] that pt does not have community care benefits. Meaning only hospital discharge meds will be covered and meds from [place] providers. Heather Schub,RN aware and will follow up.

## 2020-12-29 ENCOUNTER — Telehealth (HOSPITAL_COMMUNITY): Payer: Self-pay | Admitting: Surgery

## 2020-12-29 NOTE — Telephone Encounter (Signed)
I attempted to reach patient regarding his upcoming scheduled HF [facility] Impact appt.  He did not answer and I was unable to leave a message.

## 2020-12-30 ENCOUNTER — Encounter (HOSPITAL_COMMUNITY): Payer: Self-pay

## 2020-12-30 ENCOUNTER — Ambulatory Visit (HOSPITAL_COMMUNITY)
Admission: RE | Admit: 2020-12-30 | Discharge: 2020-12-30 | Disposition: A | Discharge status: Home / Self Care | Payer: No Typology Code available for payment source | Source: Ambulatory Visit | Attending: Internal Medicine | Admitting: Internal Medicine

## 2020-12-30 ENCOUNTER — Other Ambulatory Visit: Payer: Self-pay

## 2020-12-30 VITALS — BP 132/78 | HR 73 | Wt 191.1 lb

## 2020-12-30 DIAGNOSIS — I420 Dilated cardiomyopathy: Secondary | ICD-10-CM | POA: Diagnosis not present

## 2020-12-30 DIAGNOSIS — G4733 Obstructive sleep apnea (adult) (pediatric): Secondary | ICD-10-CM | POA: Insufficient documentation

## 2020-12-30 DIAGNOSIS — F17290 Nicotine dependence, other tobacco product, uncomplicated: Secondary | ICD-10-CM | POA: Diagnosis not present

## 2020-12-30 DIAGNOSIS — N1832 Chronic kidney disease, stage 3b: Secondary | ICD-10-CM

## 2020-12-30 DIAGNOSIS — J449 Chronic obstructive pulmonary disease, unspecified: Secondary | ICD-10-CM

## 2020-12-30 DIAGNOSIS — Z955 Presence of coronary angioplasty implant and graft: Secondary | ICD-10-CM | POA: Diagnosis not present

## 2020-12-30 DIAGNOSIS — E785 Hyperlipidemia, unspecified: Secondary | ICD-10-CM | POA: Insufficient documentation

## 2020-12-30 DIAGNOSIS — Z7901 Long term (current) use of anticoagulants: Secondary | ICD-10-CM | POA: Insufficient documentation

## 2020-12-30 DIAGNOSIS — E1122 Type 2 diabetes mellitus with diabetic chronic kidney disease: Secondary | ICD-10-CM | POA: Insufficient documentation

## 2020-12-30 DIAGNOSIS — I5032 Chronic diastolic (congestive) heart failure: Secondary | ICD-10-CM | POA: Insufficient documentation

## 2020-12-30 DIAGNOSIS — I129 Hypertensive chronic kidney disease with stage 1 through stage 4 chronic kidney disease, or unspecified chronic kidney disease: Secondary | ICD-10-CM | POA: Insufficient documentation

## 2020-12-30 DIAGNOSIS — Z79899 Other long term (current) drug therapy: Secondary | ICD-10-CM | POA: Insufficient documentation

## 2020-12-30 DIAGNOSIS — Z7982 Long term (current) use of aspirin: Secondary | ICD-10-CM | POA: Diagnosis not present

## 2020-12-30 DIAGNOSIS — K7460 Unspecified cirrhosis of liver: Secondary | ICD-10-CM | POA: Diagnosis not present

## 2020-12-30 DIAGNOSIS — I5042 Chronic combined systolic (congestive) and diastolic (congestive) heart failure: Secondary | ICD-10-CM | POA: Diagnosis not present

## 2020-12-30 DIAGNOSIS — J811 Chronic pulmonary edema: Secondary | ICD-10-CM | POA: Diagnosis not present

## 2020-12-30 DIAGNOSIS — D509 Iron deficiency anemia, unspecified: Secondary | ICD-10-CM | POA: Diagnosis not present

## 2020-12-30 DIAGNOSIS — I2510 Atherosclerotic heart disease of native coronary artery without angina pectoris: Secondary | ICD-10-CM

## 2020-12-30 DIAGNOSIS — D860 Sarcoidosis of lung: Secondary | ICD-10-CM

## 2020-12-30 DIAGNOSIS — Z7984 Long term (current) use of oral hypoglycemic drugs: Secondary | ICD-10-CM | POA: Diagnosis not present

## 2020-12-30 DIAGNOSIS — N183 Chronic kidney disease, stage 3 unspecified: Secondary | ICD-10-CM | POA: Diagnosis not present

## 2020-12-30 LAB — BASIC METABOLIC PANEL
Anion gap: 7 (ref 5–15)
BUN: 16 mg/dL (ref 8–23)
CO2: 24 mmol/L (ref 22–32)
Calcium: 10.1 mg/dL (ref 8.9–10.3)
Chloride: 106 mmol/L (ref 98–111)
Creatinine, Ser: 1.67 mg/dL — ABNORMAL HIGH (ref 0.61–1.24)
GFR, Estimated: 46 mL/min — ABNORMAL LOW (ref >60)
Glucose, Bld: 95 mg/dL (ref 70–99)
Potassium: 4.5 mmol/L (ref 3.5–5.1)
Sodium: 137 mmol/L (ref 135–145)

## 2020-12-30 MED ORDER — SODIUM CHLORIDE 0.9 % IV SOLN
510.0000 mg | Freq: Once | INTRAVENOUS | Status: AC
  Administered 2020-12-30: 510 mg via INTRAVENOUS
  Filled 2020-12-30: qty 510

## 2020-12-30 NOTE — Progress Notes (Signed)
Pt's FMLA forms completed while he was in office and given back to him to return to his employer

## 2020-12-30 NOTE — Progress Notes (Addendum)
[facility] Transitions of Care Clinic  PCP: [facility] Primary Cardiologist: [facility] Dr. Senaida Ores, [facility] Nicki Guadalajara  HPI:  Charles Krueger is a 65 y.o.  male  with a PMH significant for pulmonary sarcoidosis, type 2 diabetes, non-oxygen dependent COPD, OSA, EtOH abuse, history of pancreatitis with pseudocyst and abscess, gout, adrenal adenoma, hypertension, hyperlipidemia,CAD, CKD stage III, cirrhosis, nephrotic syndrome  Per chart review LHC in 2010 which revealed EF 45% with global hypokinesis,  RCA 60 to 70% stenosis but nothing amendable to PCI.   He started developing shortness of breath several months ago and was admitted initially at [facility] in April 2022.   He had an ECHO with EF 50-55%, G2DD, RVSP estimation >60.  He left before being discharged due to a disagreement with the inpatient team.  Only diuretic he had after this was thiazide.    He presented to [facility] on 12/08/2020 for progressively worsening shortness of breath with exertion, lower extremity edema up to bilateral thighs, abdominal bloating, activity intolerance, orthopnea, PND.  He reported alcohol use disorder history with history of heavy drinking for >30 years until 2010 but still drinking 1-2 shots of liquor 4-5x per week.  CXR revealed mild to moderate pulmonary edema.  BNP was1213.  CMP revealed elevated creatinine 1.72, albumin 2.6, elevated alkaline phos 348, and GFR 44.  Hs troponin negative x2. EKG revealed sinus rhythm with PAC, rate  97 , T wave flattening of V3-V6. He was started on IV lasix.  Had a repeat echo 12/09/20 EF 30 to 35%, no RWMA,  RV systolic function mildly reduced, RV size moderately enlarged, mild to moderate TR, mild AS.  Underwent R/LHC:    PAP-Mean: 73/23 mmHg - 43 mmHg Transpulmonary Gradient 20-22 mmHg PCWP Elevated LV EDP consistent with volume overload. LVP-EDP: 142/8 mmHg - 21 mmHg  AoP-MAP 144/75 mmHg - 100 mmHg Ao sat 91%, PA sat 54% (Fick) Cardiac Output 5.79, Cardiac  Index 2.75  Diuresis continued wt trend 224>200lbs.  GDMT titrated.    Reports he is monitored by pulmonology at [facility] for his sarcoidosis with yearly PFT's and chest imaging.  They tell him there has been no significant changes.    Denied covid or recent viral symptoms before hospitalization.    Last visit doing well with improvement in exercise capacity.  Still losing weight rapidly. Weight down an additional 14lbs to 186.  We switched diuretic to PRN and added spironolactone  Feeling well since last visit.  Denies dyspnea, orthopnea or PND.  Wife has told him to slow down some, still working in the yard and staying active.  Watching his diet decreased salt and monitoring fluid intake.  Hasn't taken any PRN lasix.  Weight has been stable at 188lbs by his home scale.  BP at home has been around 130 systolic.     ROS: All systems negative except as listed in HPI, PMH and Problem List.  SH:  Social History   Socioeconomic History   Marital status: Married    Spouse name: Not on file   Number of children: Not on file   Years of education: Not on file   Highest education level: Not on file  Occupational History   Not on file  Tobacco Use   Smoking status: Some Days    Pack years: 0.00    Types: Cigars    Start date: 2002   Smokeless tobacco: Current   Tobacco comments:    3-4 Cigars per week  Substance and  Sexual Activity   Alcohol use: Yes    Alcohol/week: 6.0 - 7.0 standard drinks    Types: 6 - 7 Shots of liquor per week    Comment: 6-7 shots per week   Drug use: No   Sexual activity: Not on file  Other Topics Concern   Not on file  Social History Narrative   Not on file   Social Determinants of Health   Financial Resource Strain: Not on file  Food Insecurity: Not on file  Transportation Needs: Not on file  Physical Activity: Not on file  Stress: Not on file  Social Connections: Not on file  Intimate Partner Violence: Not on file    FH: No family history on  file.  Past Medical History:  Diagnosis Date   Diabetes mellitus without complication (HCC)    HTN (hypertension)    OSA on CPAP     Current Outpatient Medications  Medication Sig Dispense Refill   albuterol (VENTOLIN HFA) 108 (90 Base) MCG/ACT inhaler Inhale 2 puffs into the lungs every 6 (six) hours as needed for wheezing or shortness of breath.     allopurinol (ZYLOPRIM) 100 MG tablet Take 200 mg by mouth daily.     aspirin 81 MG tablet Take 162 mg by mouth at bedtime.     atorvastatin (LIPITOR) 40 MG tablet Take 1 tablet (40 mg total) by mouth daily. 30 tablet 0   carvedilol (COREG) 25 MG tablet Take 25 mg by mouth 2 (two) times daily with a meal.     empagliflozin (JARDIANCE) 10 MG TABS tablet Take 1 tablet (10 mg total) by mouth daily. 90 tablet 3   fluticasone (FLONASE) 50 MCG/ACT nasal spray Place 2 sprays into both nostrils daily.     furosemide (LASIX) 80 MG tablet Take 1 tablet (80 mg total) by mouth daily for 2 days, THEN 1 tablet (80 mg total) daily as needed for fluid or edema. 30 tablet 2   hydrALAZINE (APRESOLINE) 25 MG tablet Take 1.5 tablets (37.5 mg total) by mouth every 8 (eight) hours. 405 tablet 3   ipratropium (ATROVENT) 0.03 % nasal spray Place 2 sprays into both nostrils daily.     isosorbide mononitrate (IMDUR) 30 MG 24 hr tablet Take 1 tablet (30 mg total) by mouth daily. 90 tablet 3   ketotifen (ZADITOR) 0.025 % ophthalmic solution Place 1 drop into both eyes every morning.     metFORMIN (GLUCOPHAGE) 1000 MG tablet Take 1,000 mg by mouth 2 (two) times daily with a meal.     Multiple Vitamin (MULTIVITAMIN WITH MINERALS) TABS tablet Take 1 tablet by mouth daily.     Omega 3 1200 MG CAPS Take 1,200 mg by mouth daily.     OXYGEN Inhale 2 L into the lungs as needed.     sacubitril-valsartan (ENTRESTO) 49-51 MG Take 1 tablet by mouth 2 (two) times daily. 180 tablet 3   spironolactone (ALDACTONE) 25 MG tablet Take 0.5 tablets (12.5 mg total) by mouth daily. 15 tablet  0   Tiotropium Bromide-Olodaterol (STIOLTO RESPIMAT) 2.5-2.5 MCG/ACT AERS Inhale 2 puffs into the lungs daily.     No current facility-administered medications for this encounter.    Vitals:   12/30/20 1151  BP: 132/78  Pulse: 73  SpO2: 94%  Weight: 86.7 kg (191 lb 2 oz)    PHYSICAL EXAM: Cardiac: JVD 8, normal rate and rhythm, clear s1 and s2, no murmurs, rubs or gallops, no LE edema bilaterally Pulmonary: faint basilar rales, not  in distress Abdominal: non distended abdomen, soft and nontender Psych: Alert, conversant, in good spirits   ASSESSMENT & PLAN: Chronic Combined systolic and diastolic heart failure Biventricular CHF - 11/2020 EF 30-35%, RV with reduced contractility of the mid RV and base  - right and left heart cath with CAD and PH as above - NYHA Class II symptoms, euvolemic on exam - Continue entresto 49/51, Carvedilol 25mg  BID, Bidil equivalent 1tab TID, Jardiance 10, Spiro 12.5mg  - Continue lasix 80mg  PRN - Repeat BMP today - Ordered Cardiac MRI but was told this cannot be done and that he may not be able to follow up here due to insurance constraints? - Given multiorgan dysfunction may benefit from rheumatology follow up  Mixed pulmonary venous and arterial hypertension Pulmonary sarcoid -[facility]'s and CT scans -encouraged cpap compliance -may benefit from V/Q scan going forward    CAD - cath with severe single vessel disease and moderate diffuse multivessel disease - cath results as above - medical therapy was recommended - continue ASA, 40 mg lipitor, BB - no s/s of chest pain   CKD stage IIIb Nephrotic syndrome Hypoalbuminemia - repeat bmp - continue jardiance, Entresto, Spiro - Lasix PRN   Cirrhosis: - noted on abdominal 10-02-2006 12/08/20 - encouraged continued complete alcohol cessation - PCP will need to follow going forward   COPD: -on daily LABA/LAMA therapy -continue pulmonology follow  up  Iron Deficiency Anemia: -received feraheme during hospitalization hgb has improved from 9.8 to 12.1.  He received his second dose of IV feraheme 510mg  today at our infusion clinic -will need repeat CBC and iron studies in 6 weeks -PCP to manage colon cancer screening   Original plan was to follow up with AHF clinic however due to insurance constraints he may not be able to, we are looking into this

## 2020-12-30 NOTE — Patient Instructions (Signed)
Labs done today, your results will be available in MyChart, we will contact you for abnormal readings.  Thank you for allowing [place] to provider your heart failure care after your recent hospitalization. Please follow-up with your Primary Care Provider at the [facility]  If you have any questions, issues, or concerns before your next appointment please call our office at [phone], opt. 2 and leave a message for the triage nurse.  Do the following things EVERYDAY: Weigh yourself in the morning before breakfast. Write it down and keep it in a log. Take your medicines as prescribed Eat low salt foods--Limit salt (sodium) to 2000 mg per day.  Stay as active as you can everyday Limit all fluids for the day to less than 2 liters

## 2021-01-08 NOTE — Progress Notes (Signed)
Office Visit    Patient Name: Charles Krueger Date of Encounter: 01/09/2021  PCP:  [facility]  Cardiologist:  Nicki Guadalajara, MD / [facility] Dr[facility] Senaida Ores Advanced Practice Provider:  No care team member to display Electrophysiologist:  None    Chief Complaint    Charles Krueger is a 65 y.o. male with a hx of pulmonary sarcoidosis, DM2, COPD, OSA, alcohol abuse, pancreatitis with pseudocyst and abscess, gout, adrenal adenoma, hypertension, HLD, CAD, CKDIII, cirrhosis, nephrotic syndrome presents today for follow up of heart failure.   Past Medical History    Past Medical History:  Diagnosis Date   Diabetes mellitus without complication (HCC)    HTN (hypertension)    OSA on CPAP    Past Surgical History:  Procedure Laterality Date   RIGHT/LEFT HEART CATH AND CORONARY ANGIOGRAPHY N/A 12/14/2020   Procedure: RIGHT/LEFT HEART CATH AND CORONARY ANGIOGRAPHY;  Surgeon: Marykay Lex, MD;  Location: [facility] INVASIVE CV LAB;  Service: Cardiovascular;  Laterality: N/A;    Allergies  Allergies  Allergen Reactions   Lisinopril Other (See Comments)    Acute renal failure    History of Present Illness    Charles Krueger is a 65 y.o. male with a hx of  pulmonary sarcoidosis, DM2, COPD, OSA, alcohol abuse, pancreatitis with pseudocyst and abscess, gout, adrenal adenoma, hypertension, HLD, CAD, CKDIII, cirrhosis, nephrotic syndrome last seen 12/30/20 by heart failure transitions of care clinic.  Previous cardiac catheterization 2010 with LVEF 45% with global hypokinesis, RCA 60-70% stenosis but not amenable to PCI.   He was admitted at [facility] April 2022 due to shortness of breath with echo showing LVEF 50-55%, gr2DD, RVSP >60. He left prior to being discharged.  He was admitted to [facility] 12/08/20 for worsening shortness of breath. He reported alcohol use disorder with history of heavy drinking for >30 years until 2010 but still drinking 1-2 shots of liquor 4-5x per  week. CXR with mild to moderate pulmonary edema. HStroponin negative x2. He was treated with IV Lasix. Repeat echo 12/09/20 LVEF 30-35%, no RWMA, RVSF mildly reduced, RV moderately enlarged, mild to moderate TR, mild AS. Underwent R/LHC 12/14/20 with severe single-vessel disease with moderate diffuse multivessel disease (50% ostial RCA followed by 70% pRCA after major RVM then mRCA 100% CETo with bridging collaterals and R-R collaterals filling distal PDA), 60-65% ostial RI, 40-50% p-mLAD, 40-60% LCx-OM1). Cardiac cath noted moderate combined pulmonary hypertension.  He follows with pulmonoloy at [facility] related to sarcoidosis with annual PFT's and chest imaging.   He was seen in transition of care heart failure clinic. Diuretic transitioned to PRN and Spironolactone added.   He presents today for follow up. Reports feeling overall well since last seen. Home weight have been stable. Has not required PRN Lasix. BP at home on estimated average 138/75. Reports no shortness of breath nor dyspnea on exertion. Reports no chest pain, pressure, or tightness. No edema, orthopnea, PND. Reports no palpitations.    EKGs/Labs/Other Studies Reviewed:   The following studies were reviewed today:  Echo 12/09/20 1. Left ventricular ejection fraction, by estimation, is 30 to 35%. The  left ventricle has moderately decreased function. The left ventricle has  no regional wall motion abnormalities. Left ventricular diastolic  parameters are indeterminate.   2. The RV wall motion is not seen as well as we would like but there is  contractility of the apex with reduced contractility of the mid RV and  base (  McConnels Sign)      Consider evaluation for pulmonary embolus is clinically indicated.      . Right ventricular systolic function is mildly reduced. The right  ventricular size is moderately enlarged. There is mildly elevated  pulmonary artery systolic pressure. The estimated right ventricular  systolic pressure is  38.8 mmHg.   3. Right atrial size was mildly dilated.   4. The mitral valve is grossly normal. Mild mitral valve regurgitation.   5. Tricuspid valve regurgitation is mild to moderate.   6. The aortic valve is grossly normal. Aortic valve regurgitation is not  visualized. Mild aortic valve stenosis.   R/LHC 12/14/20  SUMMARY Severe Single-Vessel Disease with Moderate diffuse Multivessel Disease -50% ostial RCA followed by 70% proximal RCA after major RVM then mid RCA 100% CTO with bridging collaterals and R-R collaterals from RV M filling the distal PDA.  (Small caliber distal vessels) -Discrete napkin ring concentric 60 to 65% ostial RI -Diffuse 40 to 50% proximal to mid LAD, proximal small D3 and extensive 40 to 60% LCx-OM1   Moderate Combined Pulmonary Hypertension-73/23 mmHg with mean PAP 40 to 43 mmHg (Transpulmonary Gradient 20 to 22 mmHg).  LVEDP 21 mmHg with PCWP 18 mmHg; mean RAP 15 mmHg. Mildly Reduced Cardiac Index: (Fick) Cardiac Output 5.79, Index 2.75     RECOMMENDATIONS Recommend medical management for existing CAD Continue diuresis although left-sided filling pressures are not significantly elevated--May benefit from pulmonary Inodilator (milrinone)  EKG:  No EKG today  Recent Labs: 12/09/2020: ALT 18 12/10/2020: TSH 1.457 12/16/2020: Magnesium 1.7 12/23/2020: B Natriuretic Peptide 290.3; Hemoglobin 12.1; Platelets 289 12/30/2020: BUN 16; Creatinine, Ser 1.67; Potassium 4.5; Sodium 137  Recent Lipid Panel No results found for: CHOL, TRIG, HDL, CHOLHDL, VLDL, LDLCALC, LDLDIRECT Home Medications   Current Meds  Medication Sig   albuterol (VENTOLIN HFA) 108 (90 Base) MCG/ACT inhaler Inhale 2 puffs into the lungs every 6 (six) hours as needed for wheezing or shortness of breath.   allopurinol (ZYLOPRIM) 100 MG tablet Take 200 mg by mouth daily.   aspirin 81 MG tablet Take 162 mg by mouth at bedtime.   carvedilol (COREG) 25 MG tablet Take 25 mg by mouth 2 (two) times daily  with a meal.   empagliflozin (JARDIANCE) 10 MG TABS tablet Take 1 tablet (10 mg total) by mouth daily.   fluticasone (FLONASE) 50 MCG/ACT nasal spray Place 2 sprays into both nostrils daily.   furosemide (LASIX) 80 MG tablet Take 1 tablet (80 mg total) by mouth daily for 2 days, THEN 1 tablet (80 mg total) daily as needed for fluid or edema.   hydrALAZINE (APRESOLINE) 25 MG tablet Take 1.5 tablets (37.5 mg total) by mouth every 8 (eight) hours.   ipratropium (ATROVENT) 0.03 % nasal spray Place 2 sprays into both nostrils daily.   isosorbide mononitrate (IMDUR) 30 MG 24 hr tablet Take 1 tablet (30 mg total) by mouth daily.   ketotifen (ZADITOR) 0.025 % ophthalmic solution Place 1 drop into both eyes every morning.   metFORMIN (GLUCOPHAGE) 1000 MG tablet Take 1,000 mg by mouth 2 (two) times daily with a meal.   Multiple Vitamin (MULTIVITAMIN WITH MINERALS) TABS tablet Take 1 tablet by mouth daily.   Omega 3 1200 MG CAPS Take 1,200 mg by mouth daily.   OXYGEN Inhale 2 L into the lungs as needed.   sacubitril-valsartan (ENTRESTO) 49-51 MG Take 1 tablet by mouth 2 (two) times daily.   Tiotropium Bromide-Olodaterol (STIOLTO RESPIMAT) 2.5-2.5 MCG/ACT AERS  Inhale 2 puffs into the lungs daily.   [DISCONTINUED] atorvastatin (LIPITOR) 40 MG tablet Take 1 tablet (40 mg total) by mouth daily.   [DISCONTINUED] spironolactone (ALDACTONE) 25 MG tablet Take 0.5 tablets (12.5 mg total) by mouth daily.     Review of Systems      All other systems reviewed and are otherwise negative except as noted above.  Physical Exam    VS:  BP 138/72   Pulse 74   Ht 5\' 8"  (1.727 m)   Wt 186 lb 9.6 oz (84.6 kg)   SpO2 95%   BMI 28.37 kg/m  , BMI Body mass index is 28.37 kg/m.  Wt Readings from Last 3 Encounters:  01/09/21 186 lb 9.6 oz (84.6 kg)  12/30/20 191 lb 2 oz (86.7 kg)  12/30/20 188 lb (85.3 kg)    GEN: Well nourished, well developed, in no acute distress. HEENT: normal. Neck: Supple, no JVD, carotid  bruits, or masses. Cardiac: RRR, no murmurs, rubs, or gallops. No clubbing, cyanosis, edema.  Radials/PT 2+ and equal bilaterally.  Respiratory:  Respirations regular and unlabored, clear to auscultation bilaterally. GI: Soft, nontender, nondistended. MS: No deformity or atrophy. Skin: Warm and dry, no rash. Neuro:  Strength and sensation are intact. Psych: Normal affect.  Assessment & Plan    Chronic combined systolic and diastolic heart failure - Euvolemic and well compensated on exam. NYHA I-II.  GDMT includes Jardiance 10mg  QD, Entresto 49-51mg  BID, Hydralazine 37.5mg  TID, Imdur 30mg  QD, Lasix 80mg  PRN. For further optimization of GDMT, increase Spironolactone to 25mg  QD. Plan for BMP today and in 1 week at follow up with Advanced Heart Failure Clinic. Heart healthy diet and regular cardiovascular exercise encouraged.  Recommend continuing daily weights. Consider repeat echo vs cardiac MRI for reassessment of LVEF 3 months after optimization of HF therapies. He is working to determine benefits with his insurance plan vs [facility].   HTN - BP mildly elevated on home readings and office reading today, increase Spironolactone as above.  Mixed pulmonary venous and arterial hypertension / Pulmonary sarcoidosis - Continue to follow with PCP and pulmonology at the [facility].   CKDIIIb / Nephrotic syndrome - Careful titration of diuretic and antihypertensive.  BMP ordered. Continue SGLT2i for renal protection.   CAD - Stable with no anginal symptoms. No indication for ischemic evaluation.  GDMT includes aspirin, atorvastatin, carvedilol, Imdur.   Cirrhosis - Noted on abdominal 12/08/20. Complete alcohol cessation encouraged.   COPD - Continue to follow with PCP and pulmonology at the [facility].   Iron deficiency anemia - 12/23/20 Hb 12.1. Recommend dietary sources of iron. Received IV feraheme 12/30/20 at Advanced Heart Failure clinic. Will need repeat CBC, iron studies approximately 02/10/21.  Disposition:  Follow up in 1 week with Advanced Heart Failure Clinic and in 3 months with Dr. 12/10/20  Signed, [facility], NP 01/09/2021, 8:38 AM [facility]

## 2021-01-09 ENCOUNTER — Other Ambulatory Visit: Payer: Self-pay | Admitting: Cardiovascular Disease

## 2021-01-09 ENCOUNTER — Ambulatory Visit (INDEPENDENT_AMBULATORY_CARE_PROVIDER_SITE_OTHER): Payer: No Typology Code available for payment source | Admitting: Family

## 2021-01-09 ENCOUNTER — Other Ambulatory Visit: Payer: Self-pay

## 2021-01-09 ENCOUNTER — Encounter (HOSPITAL_BASED_OUTPATIENT_CLINIC_OR_DEPARTMENT_OTHER): Payer: Self-pay | Admitting: Family

## 2021-01-09 ENCOUNTER — Other Ambulatory Visit (HOSPITAL_COMMUNITY): Payer: Self-pay

## 2021-01-09 VITALS — BP 138/72 | HR 74 | Ht 68.0 in | Wt 186.6 lb

## 2021-01-09 DIAGNOSIS — E785 Hyperlipidemia, unspecified: Secondary | ICD-10-CM

## 2021-01-09 DIAGNOSIS — N1832 Chronic kidney disease, stage 3b: Secondary | ICD-10-CM | POA: Diagnosis not present

## 2021-01-09 DIAGNOSIS — I5042 Chronic combined systolic (congestive) and diastolic (congestive) heart failure: Secondary | ICD-10-CM | POA: Diagnosis not present

## 2021-01-09 DIAGNOSIS — I2510 Atherosclerotic heart disease of native coronary artery without angina pectoris: Secondary | ICD-10-CM

## 2021-01-09 DIAGNOSIS — K7460 Unspecified cirrhosis of liver: Secondary | ICD-10-CM

## 2021-01-09 DIAGNOSIS — D860 Sarcoidosis of lung: Secondary | ICD-10-CM

## 2021-01-09 DIAGNOSIS — J449 Chronic obstructive pulmonary disease, unspecified: Secondary | ICD-10-CM

## 2021-01-09 LAB — BASIC METABOLIC PANEL
BUN/Creatinine Ratio: 10 (ref 10–24)
BUN: 16 mg/dL (ref 8–27)
CO2: 20 mmol/L (ref 20–29)
Calcium: 10.5 mg/dL — ABNORMAL HIGH (ref 8.6–10.2)
Chloride: 106 mmol/L (ref 96–106)
Creatinine, Ser: 1.65 mg/dL — ABNORMAL HIGH (ref 0.76–1.27)
Glucose: 96 mg/dL (ref 65–99)
Potassium: 4.8 mmol/L (ref 3.5–5.2)
Sodium: 139 mmol/L (ref 134–144)
eGFR: 46 mL/min/{1.73_m2} — ABNORMAL LOW (ref >59)

## 2021-01-09 MED ORDER — SPIRONOLACTONE 25 MG PO TABS
25.0000 mg | ORAL_TABLET | Freq: Every day | ORAL | 1 refills | Status: DC
  Filled 2021-01-09: qty 30, 30d supply, fill #0

## 2021-01-09 MED ORDER — ATORVASTATIN CALCIUM 40 MG PO TABS
40.0000 mg | ORAL_TABLET | Freq: Every day | ORAL | 1 refills | Status: AC
  Filled 2021-01-09: qty 30, 30d supply, fill #0

## 2021-01-09 NOTE — Patient Instructions (Signed)
Medication Instructions:  Your physician has recommended you make the following change in your medication:   CHANGE Spironolactone to one 25mg  tablet daily  *If you need a refill on your cardiac medications before your next appointment, please call your pharmacy*  Lab Work: Your physician recommends  lab work: BMP  If you have labs (blood work) drawn today and your tests are completely normal, you will receive your results only by: MyChart Message (if you have MyChart) OR A paper copy in the mail If you have any lab test that is abnormal or we need to change your treatment, we will call you to review the results.   Testing/Procedures: None ordered today.   Follow-Up: At [facility], you and your health needs are our priority.  As part of our continuing mission to provide you with exceptional heart care, we have created designated Provider Care Teams.  These Care Teams include your primary Cardiologist (physician) and Advanced Practice Providers (APPs -  Physician Assistants and Nurse Practitioners) who all work together to provide you with the care you need, when you need it.  We recommend signing up for the patient portal called "MyChart".  Sign up information is provided on this After Visit Summary.  MyChart is used to connect with patients for Virtual Visits (Telemedicine).  Patients are able to view lab/test results, encounter notes, upcoming appointments, etc.  Non-urgent messages can be sent to your provider as well.   To learn more about what you can do with MyChart, go to [facility].    Your next appointment:   As scheduled with the heart failure clinic AND In 3 months with Dr. [email or web address]   Other Instructions  Heart Healthy Diet Recommendations: A low-salt diet is recommended. Meats should be grilled, baked, or boiled. Avoid fried foods. Focus on lean protein sources like fish or chicken with vegetables and fruits. The American Heart Association is a Tresa Endo!  American Heart Association Diet and Lifeystyle Recommendations   Exercise recommendations: The American Heart Association recommends 150 minutes of moderate intensity exercise weekly. Try 30 minutes of moderate intensity exercise 4-5 times per week. This could include walking, jogging, or swimming.   Recommend weighing daily and keeping a log. Please call our office if you have weight gain of 2 pounds overnight or 5 pounds in 1 week.   Date  Time Weight

## 2021-01-15 NOTE — Progress Notes (Signed)
ADVANCED HEART FAILURE CLINIC NOTE  PCP: [facility] Primary Cardiologist: [facility] Dr. Senaida Ores, [facility] Nicki [place]   HPI:  Charles Krueger is a 65 y.o.  male  with a PMH significant for pulmonary sarcoidosis, type 2 diabetes, non-oxygen dependent COPD, OSA, ETOH abuse, history of pancreatitis with pseudocyst and abscess, gout, adrenal adenoma, hypertension, hyperlipidemia,CAD, CKD stage III, cirrhosis, nephrotic syndrome  LHC in 2010 with EF 45% with global hypokinesis,  RCA 60 to 70% stenosis but nothing amendable to PCI.   He started developing shortness of breath several months ago and was admitted initially at [facility] in April 2022.   He had an ECHO with EF 50-55%, G2DD, RVSP estimation >60.  He left before being discharged due to a disagreement with the inpatient team.  Only diuretic he had after this was thiazide.    He presented to [facility] on 12/08/2020 for progressively worsening shortness of breath with exertion, lower extremity edema to thighs, abdominal bloating, activity intolerance, orthopnea, PND.  He reported alcohol use disorder history with history of heavy drinking for >30 years until 2010 but still drinking 1-2 shots of liquor 4-5x per week.  CXR revealed mild to moderate pulmonary edema.  BNP was1213.  Elevated creatinine 1.72, albumin 2.6, elevated alkaline phos 348, and GFR 44.  Hs troponin negative x2. EKG revealed sinus rhythm with PAC, rate  97 , T wave flattening of V3-V6. He was started on IV lasix.  Had a repeat echo 12/09/20 EF 30 to 35%, no RWMA,  RV systolic function mildly reduced, RV size moderately enlarged, mild to moderate TR, mild AS.  Underwent R/LHC:    PAP-Mean: 73/23 mmHg - 43 mmHg Transpulmonary Gradient 20-22 mmHg PCWP Elevated LV EDP consistent with volume overload. LVP-EDP: 142/8 mmHg - 21 mmHg  AoP-MAP 144/75 mmHg - 100 mmHg Ao sat 91%, PA sat 54% (Fick) Cardiac Output 5.79, Cardiac Index 2.75  Diuresis continued wt trend 224>200lbs.  GDMT titrated.     Reports he is monitored by pulmonology at [facility] for his sarcoidosis with yearly PFT's and chest imaging.  They tell him there has been no significant changes.    Denied covid or recent viral symptoms before hospitalization.    He was seen in [facility] clinic 12/23/20 & 12/30/20. Losing weight, titrating GDMT.  Denies dyspnea, orthopnea or PND.  Still working in the yard and staying active.  Watching his diet decreased salt and monitoring fluid intake.  Hasn't taken any PRN lasix.  Weight at home 188 lbs.  BP ~130 systolic at home.    Today he presents for HF follow up, from the [facility] clinic. Overall feeling fine. Denies increasing SOB, CP, dizziness, edema, or PND/Orthopnea. Appetite ok. No fever or chills. Weight at home 184 pounds. Taking all medications. Has not needed lasix. Smokes 4 cigars/week, ETOH 3 drinks/day, says he has cut down a lot. BP at home ~130-135s.  Review of Systems: [y] = yes, [ ]  = no   General: Weight gain [ ] ; Weight loss ]; Anorexia [ ] ; Fatigue [ ] ; Fever [ ] ; Chills [ ] ; Weakness [ ]   Cardiac: Chest pain/pressure [ ] ; Resting SOB [ ] ; Exertional SOB [ ] ; Orthopnea [ ] ; Pedal Edema [ ] ; Palpitations [ ] ; Syncope [ ] ; Presyncope [ ] ; Paroxysmal nocturnal dyspnea[ ]   Pulmonary: Cough [ ] ; Wheezing[ ] ; Hemoptysis[ ] ; Sputum [ ] ; Snoring [ ]   GI: Vomiting[ ] ; Dysphagia [ ] ; Melena[ ] ; Hematochezia [ ] ; Heartburn[ ] ; Abdominal pain [ ] ;  Constipation [ ] ; Diarrhea [ ] ; BRBPR [ ]   GU: Hematuria[ ] ; Dysuria [ ] ; Nocturia[ ]  Vascular: Pain in legs with walking [ ] ; Pain in feet with lying flat [ ] ; Non-healing sores [ ] ; Stroke [ ] ; TIA [ ] ; Slurred speech [ ] ;  Neuro: Headaches[ Y ]; Vertigo[ ] ; Seizures[ ] ; Paresthesias[ ] ;Blurred vision [ ] ; Diplopia [ ] ; Vision changes [ ]   Ortho/Skin: Arthritis [ ] ; Joint pain [ ] ; Muscle pain [ ] ; Joint swelling [ ] ; Back Pain [ ] ; Rash [ ]   Psych: Depression[ ] ; Anxiety[ ]   Heme: Bleeding problems [ ] ; Clotting disorders [ ] ; Anemia [ ]    Endocrine: Diabetes [y]; Thyroid dysfunction[ ]    SH:  Social History   Socioeconomic History   Marital status: Married    Spouse name: Not on file   Number of children: Not on file   Years of education: Not on file   Highest education level: Not on file  Occupational History   Not on file  Tobacco Use   Smoking status: Some Days    Types: Cigars    Start date: 2002   Smokeless tobacco: Current   Tobacco comments:    3-4 Cigars per week  Substance and Sexual Activity   Alcohol use: Yes    Alcohol/week: 6.0 - 7.0 standard drinks    Types: 6 - 7 Shots of liquor per week    Comment: 6-7 shots per week   Drug use: No   Sexual activity: Not on file  Other Topics Concern   Not on file  Social History Narrative   Not on file   Social Determinants of Health   Financial Resource Strain: Not on file  Food Insecurity: Not on file  Transportation Needs: Not on file  Physical Activity: Not on file  Stress: Not on file  Social Connections: Not on file  Intimate Partner Violence: Not on file   FH: No family history on file.  Past Medical History:  Diagnosis Date   Diabetes mellitus without complication (HCC)    HTN (hypertension)    OSA on CPAP    Current Outpatient Medications  Medication Sig Dispense Refill   albuterol (VENTOLIN HFA) 108 (90 Base) MCG/ACT inhaler Inhale 2 puffs into the lungs every 6 (six) hours as needed for wheezing or shortness of breath.     allopurinol (ZYLOPRIM) 100 MG tablet Take 200 mg by mouth daily.     aspirin 81 MG tablet Take 162 mg by mouth at bedtime.     atorvastatin (LIPITOR) 40 MG tablet Take 1 tablet (40 mg total) by mouth daily. 30 tablet 1   carvedilol (COREG) 25 MG tablet Take 25 mg by mouth 2 (two) times daily with a meal.     empagliflozin (JARDIANCE) 10 MG TABS tablet Take 1 tablet (10 mg total) by mouth daily. 90 tablet 3   fluticasone (FLONASE) 50 MCG/ACT nasal spray Place 2 sprays into both nostrils daily.     furosemide  (LASIX) 80 MG tablet Take 1 tablet (80 mg total) by mouth daily for 2 days, THEN 1 tablet (80 mg total) daily as needed for fluid or edema. 30 tablet 2   hydrALAZINE (APRESOLINE) 25 MG tablet Take 1.5 tablets (37.5 mg total) by mouth every 8 (eight) hours. 405 tablet 3   ipratropium (ATROVENT) 0.03 % nasal spray Place 2 sprays into both nostrils daily.     isosorbide mononitrate (IMDUR) 30 MG 24 hr tablet Take 1 tablet (30  mg total) by mouth daily. 90 tablet 3   ketotifen (ZADITOR) 0.025 % ophthalmic solution Place 1 drop into both eyes every morning.     metFORMIN (GLUCOPHAGE) 1000 MG tablet Take 1,000 mg by mouth 2 (two) times daily with a meal.     Multiple Vitamin (MULTIVITAMIN WITH MINERALS) TABS tablet Take 1 tablet by mouth daily.     Omega 3 1200 MG CAPS Take 1,200 mg by mouth daily.     OXYGEN Inhale 2 L into the lungs as needed.     sacubitril-valsartan (ENTRESTO) 49-51 MG Take 1 tablet by mouth 2 (two) times daily. 180 tablet 3   spironolactone (ALDACTONE) 25 MG tablet Take 1 tablet (25 mg total) by mouth daily. 30 tablet 1   Tiotropium Bromide-Olodaterol (STIOLTO RESPIMAT) 2.5-2.5 MCG/ACT AERS Inhale 2 puffs into the lungs daily.     No current facility-administered medications for this encounter.   BP 132/78   Pulse 75   Wt 83.5 kg (184 lb)   SpO2 95%   BMI 27.98 kg/m   Wt Readings from Last 3 Encounters:  01/16/21 83.5 kg (184 lb)  01/09/21 84.6 kg (186 lb 9.6 oz)  12/30/20 86.7 kg (191 lb 2 oz)   PHYSICAL EXAM: General:  NAD. No resp difficulty HEENT: Normal Neck: Supple. JVP 7-8. Carotids 2+ bilat; no bruits. No lymphadenopathy or thryomegaly appreciated. Cor: PMI nondisplaced. Regular rate & rhythm. No rubs, gallops or murmurs. Lungs: Clear Abdomen: Soft, nontender, + distended. No hepatosplenomegaly. No bruits or masses. Good bowel sounds. Extremities: No cyanosis, clubbing, rash, edema Neuro: Alert & oriented x 3, cranial nerves grossly intact. Moves all 4  extremities w/o difficulty. Affect pleasant.  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  - R/LHC (6/22) with CAD and PH as above. - He was unable to complete cMRI due to PTSD from [employer]. (He was not offered benzo for this at the [place]). - cMRI ordered by Dr. Frances Furbish, however was told it could not be completed here due to insurance. - NYHA II symptoms, he is mildly volume overloaded on exam. - Increase Entresto to 97/103 mg bid. - Take lasix 40 mg today only, then PRN only. - Continue carvedilol 25 mg bid.  - Continue hydralazine 37.5 mg tid + Imdur 30 mg daily. - Continue Jardiance 10 mg daily.  - Continue Spiro 12.5 mg daily. Consider increasing at next visit. - BMET today; repeat 10 days.  2. 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 - Encouraged CPAP compliance. - May benefit from V/Q scan.   3. CAD - Severe single vessel disease and moderate diffuse multivessel disease on cath (6/22). - Continue ASA, statin &  BB. - No s/s of chest pain.   4. CKD stage IIIb - Baseline SCr ~1.6 - BMET today.   5. Cirrhosis: - Noted on abdominal US 12/08/20. - Needs complete alcohol cessation. - PCP will need to follow going forward.   6. COPD: - On daily LABA/LAMA therapy. - Continue pulmonology follow up.  7. OSA - Wears CPAP about 3x/week.  - He was given a list of PCPs in area   Follow up in 3-4 weeks with pharmacy for further medication titration, anticipate increasing spiro.  [name], [name]-BC 01/16/21

## 2021-01-16 ENCOUNTER — Ambulatory Visit (HOSPITAL_COMMUNITY)
Admission: RE | Admit: 2021-01-16 | Discharge: 2021-01-16 | Disposition: A | Discharge status: Home / Self Care | Payer: No Typology Code available for payment source | Source: Ambulatory Visit | Attending: Family Medicine | Admitting: Family Medicine

## 2021-01-16 ENCOUNTER — Telehealth (HOSPITAL_COMMUNITY): Payer: Self-pay | Admitting: Pharmacy Technician

## 2021-01-16 ENCOUNTER — Other Ambulatory Visit (HOSPITAL_COMMUNITY): Payer: Self-pay

## 2021-01-16 ENCOUNTER — Other Ambulatory Visit: Payer: Self-pay

## 2021-01-16 ENCOUNTER — Encounter (HOSPITAL_COMMUNITY): Payer: Self-pay

## 2021-01-16 VITALS — BP 132/78 | HR 75 | Wt 184.0 lb

## 2021-01-16 DIAGNOSIS — J449 Chronic obstructive pulmonary disease, unspecified: Secondary | ICD-10-CM

## 2021-01-16 DIAGNOSIS — E785 Hyperlipidemia, unspecified: Secondary | ICD-10-CM

## 2021-01-16 DIAGNOSIS — I2510 Atherosclerotic heart disease of native coronary artery without angina pectoris: Secondary | ICD-10-CM

## 2021-01-16 DIAGNOSIS — E1122 Type 2 diabetes mellitus with diabetic chronic kidney disease: Secondary | ICD-10-CM | POA: Insufficient documentation

## 2021-01-16 DIAGNOSIS — I5042 Chronic combined systolic (congestive) and diastolic (congestive) heart failure: Secondary | ICD-10-CM

## 2021-01-16 DIAGNOSIS — G4733 Obstructive sleep apnea (adult) (pediatric): Secondary | ICD-10-CM

## 2021-01-16 DIAGNOSIS — F17290 Nicotine dependence, other tobacco product, uncomplicated: Secondary | ICD-10-CM | POA: Diagnosis not present

## 2021-01-16 DIAGNOSIS — N1832 Chronic kidney disease, stage 3b: Secondary | ICD-10-CM

## 2021-01-16 DIAGNOSIS — Z7984 Long term (current) use of oral hypoglycemic drugs: Secondary | ICD-10-CM | POA: Diagnosis not present

## 2021-01-16 DIAGNOSIS — I129 Hypertensive chronic kidney disease with stage 1 through stage 4 chronic kidney disease, or unspecified chronic kidney disease: Secondary | ICD-10-CM | POA: Insufficient documentation

## 2021-01-16 DIAGNOSIS — Z7982 Long term (current) use of aspirin: Secondary | ICD-10-CM | POA: Diagnosis not present

## 2021-01-16 DIAGNOSIS — N183 Chronic kidney disease, stage 3 unspecified: Secondary | ICD-10-CM | POA: Insufficient documentation

## 2021-01-16 DIAGNOSIS — Z7901 Long term (current) use of anticoagulants: Secondary | ICD-10-CM | POA: Diagnosis not present

## 2021-01-16 DIAGNOSIS — K7460 Unspecified cirrhosis of liver: Secondary | ICD-10-CM

## 2021-01-16 DIAGNOSIS — Z955 Presence of coronary angioplasty implant and graft: Secondary | ICD-10-CM | POA: Insufficient documentation

## 2021-01-16 DIAGNOSIS — D860 Sarcoidosis of lung: Secondary | ICD-10-CM

## 2021-01-16 DIAGNOSIS — I2721 Secondary pulmonary arterial hypertension: Secondary | ICD-10-CM | POA: Diagnosis not present

## 2021-01-16 DIAGNOSIS — I5082 Biventricular heart failure: Secondary | ICD-10-CM | POA: Insufficient documentation

## 2021-01-16 DIAGNOSIS — Z79899 Other long term (current) drug therapy: Secondary | ICD-10-CM | POA: Insufficient documentation

## 2021-01-16 LAB — BASIC METABOLIC PANEL
Anion gap: 8 (ref 5–15)
BUN: 22 mg/dL (ref 8–23)
CO2: 23 mmol/L (ref 22–32)
Calcium: 10.4 mg/dL — ABNORMAL HIGH (ref 8.9–10.3)
Chloride: 104 mmol/L (ref 98–111)
Creatinine, Ser: 1.68 mg/dL — ABNORMAL HIGH (ref 0.61–1.24)
GFR, Estimated: 45 mL/min — ABNORMAL LOW (ref >60)
Glucose, Bld: 79 mg/dL (ref 70–99)
Potassium: 4.7 mmol/L (ref 3.5–5.1)
Sodium: 135 mmol/L (ref 135–145)

## 2021-01-16 MED ORDER — ENTRESTO 97-103 MG PO TABS
1.0000 | ORAL_TABLET | Freq: Two times a day (BID) | ORAL | 11 refills | Status: DC
  Filled 2021-01-16 – 2021-01-19 (×3): qty 60, 30d supply, fill #0

## 2021-01-16 NOTE — Telephone Encounter (Signed)
Patient Advocate Encounter   Received notification from [employer] that prior authorization for Sherryll Burger is required.   PA submitted on CoverMyMeds Key [id number] Status is pending   Will continue to follow.

## 2021-01-16 NOTE — Patient Instructions (Signed)
INCREASE Entresto to 97/103 mg, one tab twice a day Be sure to take 40 mg of Lasix today  Labs today We will only contact you if something comes back abnormal or we need to make some changes. Otherwise no news is good news!  Labs needed in 7-10 days  Your physician recommends that you schedule a follow-up appointment in: 3-4 weeks with the pharmacy team   Do the following things EVERYDAY: Weigh yourself in the morning before breakfast. Write it down and keep it in a log. Take your medicines as prescribed Eat low salt foods--Limit salt (sodium) to 2000 mg per day.  Stay as active as you can everyday Limit all fluids for the day to less than 2 liters  milAt the [facility], you and your health needs are our priority. As part of our continuing mission to provide you with exceptional heart care, we have created designated Provider Care Teams. These Care Teams include your primary Cardiologist (physician) and Advanced Practice Providers (APPs- Physician Assistants and Nurse Practitioners) who all work together to provide you with the care you need, when you need it.   You may see any of the following providers on your designated Care Team at your next follow up: Dr Arvilla Meres Dr Marca Ancona Dr Brandon Melnick, NP Robbie Lis, [place] Mikki Santee Karle Plumber, PharmD   Please be sure to bring in all your medications bottles to every appointment.   If you have any questions or concerns before your next appointment please send [place] a message through [place] or call our office at [phone].    TO LEAVE A MESSAGE FOR THE NURSE SELECT OPTION 2, PLEASE LEAVE A MESSAGE INCLUDING: YOUR NAME DATE OF BIRTH CALL BACK NUMBER REASON FOR CALL**this is important as we prioritize the call backs  YOU WILL RECEIVE A CALL BACK THE SAME DAY AS LONG AS YOU CALL BEFORE 4:00 PM

## 2021-01-18 ENCOUNTER — Other Ambulatory Visit (HOSPITAL_COMMUNITY): Payer: Self-pay

## 2021-01-19 ENCOUNTER — Other Ambulatory Visit (HOSPITAL_COMMUNITY): Payer: Self-pay

## 2021-01-19 ENCOUNTER — Other Ambulatory Visit (HOSPITAL_COMMUNITY): Payer: Self-pay | Admitting: *Deleted

## 2021-01-19 MED ORDER — ENTRESTO 97-103 MG PO TABS
1.0000 | ORAL_TABLET | Freq: Two times a day (BID) | ORAL | 3 refills | Status: AC
  Filled 2021-01-19: qty 180, 90d supply, fill #0
  Filled 2021-05-02: qty 180, 90d supply, fill #1
  Filled 2021-05-02: qty 180, 90d supply, fill #0
  Filled 2021-08-03: qty 180, 90d supply, fill #1

## 2021-01-19 NOTE — Telephone Encounter (Signed)
Advanced Heart Failure Patient Advocate Encounter  Prior Authorization for Sherryll Burger has been approved.    PA# [id number] Effective dates: 01/18/21 through 01/19/24  Patients co-pay is $527 (90 days)  Was able to activate a $10 co-pay card. Provided billing information to pharmacy. Sent Jasmine, (CMA) a request to send a 90 day RX to [facility] outpatient.   BIN [id number] PCN [id number] [id number] ID [id number]

## 2021-01-23 ENCOUNTER — Other Ambulatory Visit (HOSPITAL_COMMUNITY): Payer: Self-pay

## 2021-01-26 ENCOUNTER — Ambulatory Visit (HOSPITAL_COMMUNITY)
Admission: RE | Admit: 2021-01-26 | Discharge: 2021-01-26 | Disposition: A | Discharge status: Home / Self Care | Payer: No Typology Code available for payment source | Source: Ambulatory Visit | Attending: Cardiology | Admitting: Cardiology

## 2021-01-26 ENCOUNTER — Other Ambulatory Visit: Payer: Self-pay

## 2021-01-26 ENCOUNTER — Other Ambulatory Visit (HOSPITAL_COMMUNITY): Payer: Self-pay

## 2021-01-26 DIAGNOSIS — I5042 Chronic combined systolic (congestive) and diastolic (congestive) heart failure: Secondary | ICD-10-CM | POA: Diagnosis present

## 2021-01-26 LAB — BASIC METABOLIC PANEL
Anion gap: 9 (ref 5–15)
BUN: 20 mg/dL (ref 8–23)
CO2: 21 mmol/L — ABNORMAL LOW (ref 22–32)
Calcium: 9.8 mg/dL (ref 8.9–10.3)
Chloride: 110 mmol/L (ref 98–111)
Creatinine, Ser: 1.65 mg/dL — ABNORMAL HIGH (ref 0.61–1.24)
GFR, Estimated: 46 mL/min — ABNORMAL LOW (ref >60)
Glucose, Bld: 101 mg/dL — ABNORMAL HIGH (ref 70–99)
Potassium: 4.6 mmol/L (ref 3.5–5.1)
Sodium: 140 mmol/L (ref 135–145)

## 2021-01-27 ENCOUNTER — Other Ambulatory Visit (HOSPITAL_COMMUNITY): Payer: Self-pay

## 2021-01-27 MED ORDER — FLUTICASONE PROPIONATE 50 MCG/ACT NA SUSP
2.0000 | Freq: Every day | NASAL | 0 refills | Status: DC
  Filled 2021-01-27: qty 48, 90d supply, fill #0

## 2021-01-30 NOTE — Progress Notes (Addendum)
PCP: [facility] Primary Cardiologist: [facility] Dr. Senaida Ores, [facility] Nicki [place] HF Cardiologist: Dr. Shirlee Latch  HPI:  Charles Krueger is a 65 y.o. male  with a PMH significant for pulmonary sarcoidosis, type 2 diabetes, non-oxygen dependent COPD, OSA, ETOH abuse, history of pancreatitis with pseudocyst and abscess, gout, adrenal adenoma, hypertension, hyperlipidemia,CAD, CKD stage III, cirrhosis, nephrotic syndrome.   LHC in 2010 with EF 45% with global hypokinesis,  RCA 60 to 70% stenosis but nothing amendable to PCI.   He started developing shortness of breath several months ago and was admitted initially at [facility] in April 2022.   He had an ECHO with EF 50-55%, G2DD, RVSP estimation >60.  He left before being discharged due to a disagreement with the inpatient team.  Only diuretic he had after this was thiazide.     He presented to [facility] on 12/08/2020 for progressively worsening shortness of breath with exertion, lower extremity edema to thighs, abdominal bloating, activity intolerance, orthopnea, PND.  He reported alcohol use disorder history with history of heavy drinking for >30 years until 2010 but still drinking 1-2 shots of liquor 4-5x per week.  CXR revealed mild to moderate pulmonary edema.  BNP was 1213.  Elevated creatinine 1.72, albumin 2.6, elevated alkaline phos 348, and GFR 44.  Hs troponin negative x2. EKG revealed sinus rhythm with PAC, rate  97 , T wave flattening of V3-V6. He was started on IV furosemide.  Had a repeat echo 12/09/20 EF 30-35%, no RWMA,  RV systolic function mildly reduced, RV size moderately enlarged, mild to moderate TR, mild AS.  Underwent R/LHC:      PAP-Mean: 73/23 mmHg - 43 mmHg Transpulmonary Gradient 20-22 mmHg PCWP Elevated LV EDP consistent with volume overload. LVP-EDP: 142/8 mmHg - 21 mmHg  AoP-MAP 144/75 mmHg - 100 mmHg Ao sat 91%, PA sat 54% (Fick) Cardiac Output 5.79, Cardiac Index 2.75   Diuresis continued weight trend 224>200lbs.  GDMT titrated.      Reports he is monitored by pulmonology at [facility] for his sarcoidosis with yearly PFT's and chest imaging.  They tell him there has been no significant changes.     Denied covid or recent viral symptoms before hospitalization.     He was seen in [facility] clinic 12/23/20 & 12/30/20. Losing weight, titrating GDMT.  Denied dyspnea, orthopnea or PND.  Still working in the yard and staying active.  Watching his diet, decreased salt and monitoring fluid intake.  Hasn't taken any PRN lasix.  Weight at home 188 lbs.  BP ~130 systolic at home.     Recently presented to HF Clinic for HF follow up, from the [facility] clinic on 01/16/21. Overall was feeling fine. Denied increasing SOB, CP, dizziness, edema, or PND/Orthopnea. Appetite was ok. No fever or chills. Weight at home was 184 pounds. Reported taking all medications. Had not needed furosemide. Was smoking 4 cigars/week, ETOH 3 drinks/day, stated he has cut down a lot. BP at home ~130-135s.  Today he returns to HF clinic for pharmacist medication titration. At last visit with APP, Entresto was increased to 97/103 mg BID and furosemide was decreased to PRN only. Overall he is feeling good today. No dizziness, lightheadedness, chest pain or palpitations. Feels he has more energy. No SOB/DOE. Weight at home has been stable at 183-186 lbs. Has not needed any PRN furosemide. No LEE, PND or orthopnea. Taking all medications as prescribed and tolerating all medications.    HF Medications: Carvedilol 25 mg BID Entresto 97/103 mg BID  Spironolactone 12.5 mg daily Jardiance 10 mg daily Hydralazine 37.5 mg TID Isosorbide mononitrate 30 mg daily Furosemide 80 mg PRN   Has the patient been experiencing any side effects to the medications prescribed?  no  Does the patient have any problems obtaining medications due to transportation or finances?   No - Has [facility]  Understanding of regimen: good Understanding of indications: good Potential of compliance:  good Patient understands to avoid NSAIDs. Patient understands to avoid decongestants.    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  - R/LHC (11/2020) with CAD and PH as above. - He was unable to complete cMRI due to PTSD from [employer]. (He was not offered benzo for this at the [place]). - cMRI ordered by Dr. Frances Furbish, however was told it could not be completed here due to insurance. - NYHA II symptoms, euvolemic on exam. - Continue furosemide 80 mg PRN only - Continue carvedilol 25 mg BID. - Continue Entresto 97/103 mg BID. - Increase spironolactone to 25 mg daily. Repeat BMET in 1 week.  - Continue Jardiance 10 mg daily. - Increase hydralazine to 50 mg TID and continue Imdur 30 mg daily.    2. 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 - Encouraged CPAP compliance. - May benefit from V/Q scan.   3. CAD - Severe single vessel disease and moderate diffuse multivessel disease on cath (11/2020). - Continue ASA, statin &  BB. - No s/s of chest pain.   4. CKD stage IIIb - Baseline SCr ~1.6   5. Cirrhosis: - Noted on abdominal US 12/08/20. - Needs complete alcohol cessation. - PCP will need to follow going forward. He was given a list of PCPs in area at previous visit   6. COPD: - On daily LABA/LAMA therapy. - Continue pulmonology follow up.   7. OSA - Wears CPAP about 3x/week.  Return to HF Clinic in 4 weeks with APP Clinic.   Karle Plumber, PharmD, BCPS, BCCP, CPP Heart Failure Clinic Pharmacist [phone]

## 2021-02-13 ENCOUNTER — Ambulatory Visit (HOSPITAL_COMMUNITY)
Admission: RE | Admit: 2021-02-13 | Discharge: 2021-02-13 | Disposition: A | Discharge status: Home / Self Care | Payer: No Typology Code available for payment source | Source: Ambulatory Visit | Attending: Cardiology | Admitting: Cardiology

## 2021-02-13 ENCOUNTER — Other Ambulatory Visit (HOSPITAL_COMMUNITY): Payer: Self-pay

## 2021-02-13 ENCOUNTER — Other Ambulatory Visit: Payer: Self-pay

## 2021-02-13 DIAGNOSIS — I5042 Chronic combined systolic (congestive) and diastolic (congestive) heart failure: Secondary | ICD-10-CM | POA: Diagnosis not present

## 2021-02-13 DIAGNOSIS — E1122 Type 2 diabetes mellitus with diabetic chronic kidney disease: Secondary | ICD-10-CM | POA: Diagnosis not present

## 2021-02-13 DIAGNOSIS — G4733 Obstructive sleep apnea (adult) (pediatric): Secondary | ICD-10-CM | POA: Insufficient documentation

## 2021-02-13 DIAGNOSIS — I2721 Secondary pulmonary arterial hypertension: Secondary | ICD-10-CM | POA: Insufficient documentation

## 2021-02-13 DIAGNOSIS — I2510 Atherosclerotic heart disease of native coronary artery without angina pectoris: Secondary | ICD-10-CM | POA: Insufficient documentation

## 2021-02-13 DIAGNOSIS — D860 Sarcoidosis of lung: Secondary | ICD-10-CM | POA: Diagnosis not present

## 2021-02-13 DIAGNOSIS — E785 Hyperlipidemia, unspecified: Secondary | ICD-10-CM | POA: Diagnosis not present

## 2021-02-13 DIAGNOSIS — K7460 Unspecified cirrhosis of liver: Secondary | ICD-10-CM | POA: Diagnosis not present

## 2021-02-13 DIAGNOSIS — I130 Hypertensive heart and chronic kidney disease with heart failure and stage 1 through stage 4 chronic kidney disease, or unspecified chronic kidney disease: Secondary | ICD-10-CM | POA: Diagnosis not present

## 2021-02-13 DIAGNOSIS — N1832 Chronic kidney disease, stage 3b: Secondary | ICD-10-CM | POA: Diagnosis not present

## 2021-02-13 DIAGNOSIS — J449 Chronic obstructive pulmonary disease, unspecified: Secondary | ICD-10-CM | POA: Insufficient documentation

## 2021-02-13 MED ORDER — SPIRONOLACTONE 25 MG PO TABS
25.0000 mg | ORAL_TABLET | Freq: Every day | ORAL | 3 refills | Status: AC
  Filled 2021-02-13: qty 90, 90d supply, fill #0

## 2021-02-13 MED ORDER — HYDRALAZINE HCL 50 MG PO TABS
50.0000 mg | ORAL_TABLET | Freq: Three times a day (TID) | ORAL | 3 refills | Status: DC
  Filled 2021-02-13: qty 270, 90d supply, fill #0

## 2021-02-13 NOTE — Patient Instructions (Addendum)
It was a pleasure seeing you today!  MEDICATIONS: -We are changing your medications today -Increase spironolactone to 25 mg (1 tablet) daily -Increase hydralazine to 50 mg (1 tablet) three times daily -Call if you have questions about your medications.   NEXT APPOINTMENT: Return to clinic in 4 weeks with APP Clinic.  In general, to take care of your heart failure: -Limit your fluid intake to 2 Liters (half-gallon) per day.   -Limit your salt intake to ideally 2-3 grams (2000-3000 mg) per day. -Weigh yourself daily and record, and bring that "weight diary" to your next appointment.  (Weight gain of 2-3 pounds in 1 day typically means fluid weight.) -The medications for your heart are to help your heart and help you live longer.   -Please contact us before stopping any of your heart medications.  Call the clinic at [phone] with questions or to reschedule future appointments.

## 2021-02-13 NOTE — Addendum Note (Signed)
Encounter addended by: Evon Slack, RPH-CPP on: 02/13/2021 3:19 PM  Actions taken: Clinical Note Signed

## 2021-02-20 ENCOUNTER — Other Ambulatory Visit (HOSPITAL_COMMUNITY): Payer: Self-pay

## 2021-02-24 ENCOUNTER — Other Ambulatory Visit (HOSPITAL_COMMUNITY): Payer: No Typology Code available for payment source

## 2021-03-11 NOTE — Progress Notes (Addendum)
ADVANCED HEART FAILURE CLINIC NOTE  PCP: [facility] Primary Cardiologist: [facility] Dr. Senaida Ores, [facility] [place] HF Cardiologist: Dr. Shirlee Latch   HPI:  Charles Krueger is a 65 y.o.  male  with a PMH significant for pulmonary sarcoidosis, type 2 diabetes, non-oxygen dependent COPD, OSA, ETOH abuse, history of pancreatitis with pseudocyst and abscess, gout, adrenal adenoma, hypertension, hyperlipidemia,CAD, CKD stage III, cirrhosis, nephrotic syndrome  LHC in 2010 with EF 45% with global hypokinesis,  RCA 60 to 70% stenosis but nothing amendable to PCI.   He started developing shortness of breath several months ago and was admitted initially at [facility] in April 2022.   He had an ECHO with EF 50-55%, G2DD, RVSP estimation >60.  He left before being discharged due to a disagreement with the inpatient team.  Only diuretic he had after this was thiazide.    He presented to [facility] on 12/08/2020 for progressively worsening shortness of breath with exertion, lower extremity edema to thighs, abdominal bloating, activity intolerance, orthopnea, PND.  He reported alcohol use disorder history with history of heavy drinking for >30 years until 2010 but still drinking 1-2 shots of liquor 4-5x per week.  CXR revealed mild to moderate pulmonary edema.  BNP was1213.  Elevated creatinine 1.72, albumin 2.6, elevated alkaline phos 348, and GFR 44.  Hs troponin negative x2. EKG revealed sinus rhythm with PAC, rate  97 , T wave flattening of V3-V6. He was started on IV lasix.  Had a repeat echo 12/09/20 EF 30 to 35%, no RWMA,  RV systolic function mildly reduced, RV size moderately enlarged, mild to moderate TR, mild AS.    Underwent R/LHC: PAP-Mean: 73/23 mmHg - 43 mmHg Transpulmonary Gradient 20-22 mmHg PCWP Elevated LV EDP consistent with volume overload. LVP-EDP: 142/8 mmHg - 21 mmHg  AoP-MAP 144/75 mmHg - 100 mmHg Ao sat 91%, PA sat 54% (Fick) Cardiac Output 5.79, Cardiac Index 2.75  Diuresis continued wt trend  224>200lbs.  GDMT titrated.    Reports he is monitored by pulmonology at [facility] for his sarcoidosis with yearly PFT's and chest imaging.  They tell him there has been no significant changes.    He was seen in [facility] clinic 12/23/20 & 12/30/20. Losing weight, titrating GDMT.  Denies dyspnea, orthopnea or PND.  Still working in the yard and staying active.  Watching his diet decreased salt and monitoring fluid intake.  Hasn't taken any PRN lasix.  Weight at home 188 lbs.  BP ~130 systolic at home.    Today he returns for HF follow up. Overall feeling fine. He gets fatigued with yard work & carrying bags on mulch, no significant dyspnea with this. Denies palpitations, CP, dizziness, or PND/Orthopnea. Weight at home 182-184 pounds. Taking all medications. Had some swelling that resolved after taking a dose of lasix. Has only needed to use his prn lasix twice since hospitalization. Using CPAP 2-3 nights/week. Smoking 2-3/week, ETOH 2-3 days/week 2 drinks. BP at home ~ 140s/80s.  SH:  Social History   Socioeconomic History   Marital status: Married    Spouse name: Not on file   Number of children: Not on file   Years of education: Not on file   Highest education level: Not on file  Occupational History   Not on file  Tobacco Use   Smoking status: Some Days    Types: Cigars    Start date: 2002   Smokeless tobacco: Current   Tobacco comments:    3-4 Cigars per week  Substance and Sexual Activity   Alcohol use: Yes    Alcohol/week: 6.0 - 7.0 standard drinks    Types: 6 - 7 Shots of liquor per week    Comment: 6-7 shots per week   Drug use: No   Sexual activity: Not on file  Other Topics Concern   Not on file  Social History Narrative   Not on file   Social Determinants of Health   Financial Resource Strain: Not on file  Food Insecurity: Not on file  Transportation Needs: Not on file  Physical Activity: Not on file  Stress: Not on file  Social Connections: Not on file  Intimate Partner  Violence: Not on file   FH: No family history on file.  Past Medical History:  Diagnosis Date   Diabetes mellitus without complication (HCC)    HTN (hypertension)    OSA on CPAP    Current Outpatient Medications  Medication Sig Dispense Refill   albuterol (VENTOLIN HFA) 108 (90 Base) MCG/ACT inhaler Inhale 2 puffs into the lungs every 6 (six) hours as needed for wheezing or shortness of breath.     allopurinol (ZYLOPRIM) 100 MG tablet Take 100 mg by mouth daily.     aspirin 81 MG tablet Take 162 mg by mouth at bedtime.     atorvastatin (LIPITOR) 40 MG tablet Take 1 tablet (40 mg total) by mouth daily. 30 tablet 1   carvedilol (COREG) 25 MG tablet Take 25 mg by mouth 2 (two) times daily with a meal.     empagliflozin (JARDIANCE) 10 MG TABS tablet Take 1 tablet (10 mg total) by mouth daily. 90 tablet 3   fluticasone (FLONASE) 50 MCG/ACT nasal spray Place 2 sprays into both nostrils daily.     furosemide (LASIX) 80 MG tablet Take 1 tablet (80 mg total) by mouth daily for 2 days, THEN 1 tablet (80 mg total) daily as needed for fluid or edema. 30 tablet 2   hydrALAZINE (APRESOLINE) 50 MG tablet Take 1 tablet (50 mg total) by mouth 3 (three) times daily. 270 tablet 3   ipratropium (ATROVENT) 0.03 % nasal spray Place 2 sprays into both nostrils daily.     isosorbide mononitrate (IMDUR) 30 MG 24 hr tablet Take 1 tablet (30 mg total) by mouth daily. 90 tablet 3   ketotifen (ZADITOR) 0.025 % ophthalmic solution Place 1 drop into both eyes every morning.     metFORMIN (GLUCOPHAGE) 1000 MG tablet Take 1,000 mg by mouth 2 (two) times daily with a meal.     Multiple Vitamin (MULTIVITAMIN WITH MINERALS) TABS tablet Take 1 tablet by mouth daily.     Omega 3 1200 MG CAPS Take 1,200 mg by mouth daily.     OXYGEN Inhale 2 L into the lungs as needed.     sacubitril-valsartan (ENTRESTO) 97-103 MG Take 1 tablet by mouth 2 times daily. 180 tablet 3   spironolactone (ALDACTONE) 25 MG tablet Take 1 tablet (25 mg  total) by mouth daily. 90 tablet 3   Tiotropium Bromide-Olodaterol (STIOLTO RESPIMAT) 2.5-2.5 MCG/ACT AERS Inhale 2 puffs into the lungs daily.     No current facility-administered medications for this encounter.   BP (!) 142/80   Pulse 67   Wt 83.9 kg (185 lb)   SpO2 94%   BMI 28.13 kg/m   Wt Readings from Last 3 Encounters:  03/14/21 83.9 kg (185 lb)  02/13/21 84.8 kg (187 lb)  01/16/21 83.5 kg (184 lb)   PHYSICAL EXAM: General:  NAD. No resp difficulty HEENT: Normal Neck: Supple. JVP 8. Carotids 2+ bilat; no bruits. No lymphadenopathy or thryomegaly appreciated. Cor: PMI nondisplaced. Regular rate & rhythm. No rubs, gallops or murmurs. Lungs: Clear Abdomen: Soft, nontender, nondistended. No hepatosplenomegaly. No bruits or masses. Good bowel sounds. Extremities: No cyanosis, clubbing, rash,1+ BLE edema, L>R Neuro: Alert & oriented x 3, cranial nerves grossly intact. Moves all 4 extremities w/o difficulty. Affect pleasant.  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  - R/LHC (6/22) with CAD and PH as above. - He was unable to complete cMRI due to PTSD from [employer]. (He was not offered benzo for this at the [place]). - cMRI ordered by Dr. Frances Furbish, however was told it could not be completed here due to insurance. - NYHA II symptoms, he is mildly volume overloaded on exam although weight is only up 1 lb, ReDs 43%. He endorses dietary salt indiscretion. - Instructed to take lasix 80 mg bid x 3 days + 20 KCl daily x 3 days, then lasix PRN weight gain/edema  - Continue Entresto 97/103 mg bid. - Continue carvedilol 25 mg bid.  - Continue hydralazine 50 mg tid + Imdur 30 mg daily. Consider increasing hydralazine if BP not at goal after diuresis. - Continue Jardiance 10 mg daily.  - Continue Spiro 25 mg daily. - BMET today; repeat in  10 days.  2. 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 - Encouraged CPAP compliance. - May benefit from V/Q scan.   3. CAD - Severe single vessel disease and moderate diffuse multivessel disease on cath (6/22). - Continue ASA, statin & BB. - No s/s of chest pain.   4. CKD stage IIIb - Baseline SCr ~1.6 - BMET today.   5. Cirrhosis: - Noted on abdominal US 12/08/20. - Needs complete alcohol cessation. Working on cutting back. - PCP will need to follow going forward.   6. COPD: - On daily LABA/LAMA therapy. - Continue pulmonology follow up.  7. OSA - Wears CPAP about 2-3x/week. - Encouraged regular compliance  - He has been given a list of PCPs in area and encouraged to establish care.   Follow up with Dr. Shirlee Latch + echo in 6-8 weeks.  [name], [name]-BC 03/14/21

## 2021-03-14 ENCOUNTER — Ambulatory Visit (HOSPITAL_COMMUNITY)
Admission: RE | Admit: 2021-03-14 | Discharge: 2021-03-14 | Disposition: A | Discharge status: Home / Self Care | Payer: No Typology Code available for payment source | Source: Ambulatory Visit | Attending: Family Medicine | Admitting: Family Medicine

## 2021-03-14 ENCOUNTER — Other Ambulatory Visit: Payer: Self-pay

## 2021-03-14 ENCOUNTER — Other Ambulatory Visit (HOSPITAL_COMMUNITY): Payer: Self-pay

## 2021-03-14 ENCOUNTER — Encounter (HOSPITAL_COMMUNITY): Payer: Self-pay

## 2021-03-14 VITALS — BP 142/80 | HR 67 | Wt 185.0 lb

## 2021-03-14 DIAGNOSIS — N1832 Chronic kidney disease, stage 3b: Secondary | ICD-10-CM | POA: Diagnosis not present

## 2021-03-14 DIAGNOSIS — I2510 Atherosclerotic heart disease of native coronary artery without angina pectoris: Secondary | ICD-10-CM | POA: Diagnosis not present

## 2021-03-14 DIAGNOSIS — F4310 Post-traumatic stress disorder, unspecified: Secondary | ICD-10-CM | POA: Diagnosis not present

## 2021-03-14 DIAGNOSIS — I2721 Secondary pulmonary arterial hypertension: Secondary | ICD-10-CM | POA: Diagnosis not present

## 2021-03-14 DIAGNOSIS — Z9182 Personal history of military deployment: Secondary | ICD-10-CM | POA: Diagnosis not present

## 2021-03-14 DIAGNOSIS — Z7951 Long term (current) use of inhaled steroids: Secondary | ICD-10-CM | POA: Insufficient documentation

## 2021-03-14 DIAGNOSIS — E1122 Type 2 diabetes mellitus with diabetic chronic kidney disease: Secondary | ICD-10-CM | POA: Diagnosis not present

## 2021-03-14 DIAGNOSIS — Z7982 Long term (current) use of aspirin: Secondary | ICD-10-CM | POA: Diagnosis not present

## 2021-03-14 DIAGNOSIS — E785 Hyperlipidemia, unspecified: Secondary | ICD-10-CM | POA: Insufficient documentation

## 2021-03-14 DIAGNOSIS — K7460 Unspecified cirrhosis of liver: Secondary | ICD-10-CM | POA: Insufficient documentation

## 2021-03-14 DIAGNOSIS — Z597 Insufficient social insurance and welfare support: Secondary | ICD-10-CM | POA: Diagnosis not present

## 2021-03-14 DIAGNOSIS — I5042 Chronic combined systolic (congestive) and diastolic (congestive) heart failure: Secondary | ICD-10-CM | POA: Diagnosis not present

## 2021-03-14 DIAGNOSIS — I5043 Acute on chronic combined systolic (congestive) and diastolic (congestive) heart failure: Secondary | ICD-10-CM | POA: Diagnosis not present

## 2021-03-14 DIAGNOSIS — F17290 Nicotine dependence, other tobacco product, uncomplicated: Secondary | ICD-10-CM | POA: Insufficient documentation

## 2021-03-14 DIAGNOSIS — Z733 Stress, not elsewhere classified: Secondary | ICD-10-CM | POA: Diagnosis not present

## 2021-03-14 DIAGNOSIS — G4733 Obstructive sleep apnea (adult) (pediatric): Secondary | ICD-10-CM | POA: Insufficient documentation

## 2021-03-14 DIAGNOSIS — Z7984 Long term (current) use of oral hypoglycemic drugs: Secondary | ICD-10-CM | POA: Diagnosis not present

## 2021-03-14 DIAGNOSIS — I5082 Biventricular heart failure: Secondary | ICD-10-CM | POA: Diagnosis not present

## 2021-03-14 DIAGNOSIS — D860 Sarcoidosis of lung: Secondary | ICD-10-CM | POA: Diagnosis not present

## 2021-03-14 DIAGNOSIS — M109 Gout, unspecified: Secondary | ICD-10-CM | POA: Diagnosis not present

## 2021-03-14 DIAGNOSIS — Z955 Presence of coronary angioplasty implant and graft: Secondary | ICD-10-CM | POA: Insufficient documentation

## 2021-03-14 DIAGNOSIS — I130 Hypertensive heart and chronic kidney disease with heart failure and stage 1 through stage 4 chronic kidney disease, or unspecified chronic kidney disease: Secondary | ICD-10-CM | POA: Insufficient documentation

## 2021-03-14 DIAGNOSIS — F1010 Alcohol abuse, uncomplicated: Secondary | ICD-10-CM | POA: Insufficient documentation

## 2021-03-14 DIAGNOSIS — J449 Chronic obstructive pulmonary disease, unspecified: Secondary | ICD-10-CM | POA: Diagnosis not present

## 2021-03-14 DIAGNOSIS — Z79899 Other long term (current) drug therapy: Secondary | ICD-10-CM | POA: Insufficient documentation

## 2021-03-14 DIAGNOSIS — Z9989 Dependence on other enabling machines and devices: Secondary | ICD-10-CM | POA: Diagnosis not present

## 2021-03-14 LAB — BASIC METABOLIC PANEL
Anion gap: 8 (ref 5–15)
BUN: 18 mg/dL (ref 8–23)
CO2: 21 mmol/L — ABNORMAL LOW (ref 22–32)
Calcium: 9.9 mg/dL (ref 8.9–10.3)
Chloride: 106 mmol/L (ref 98–111)
Creatinine, Ser: 1.46 mg/dL — ABNORMAL HIGH (ref 0.61–1.24)
GFR, Estimated: 53 mL/min — ABNORMAL LOW (ref >60)
Glucose, Bld: 94 mg/dL (ref 70–99)
Potassium: 4 mmol/L (ref 3.5–5.1)
Sodium: 135 mmol/L (ref 135–145)

## 2021-03-14 MED ORDER — POTASSIUM CHLORIDE CRYS ER 20 MEQ PO TBCR
20.0000 meq | EXTENDED_RELEASE_TABLET | ORAL | 0 refills | Status: DC
  Filled 2021-03-14: qty 30, 30d supply, fill #0

## 2021-03-14 MED ORDER — ISOSORBIDE MONONITRATE ER 30 MG PO TB24
30.0000 mg | ORAL_TABLET | Freq: Every day | ORAL | 3 refills | Status: AC
  Filled 2021-03-14: qty 90, 90d supply, fill #0
  Filled 2021-05-19 – 2021-06-12 (×2): qty 90, 90d supply, fill #1

## 2021-03-14 NOTE — Patient Instructions (Signed)
Labs were perfomred today, if any labs come back abnormal the clinic will call you  Your physician recommends that you return for lab work in: 10 days   INCREASE Lasix to 80 mg 2 times a day for 3 days ONLY then go back to as needed TAKE Potassium 20 meq 1 tablet daily for 3 days ONLY  Your physician recommends that you schedule a follow-up appointment in: 6-8 weeks with echocardiogram   At the [facility], you and your health needs are our priority. As part of our continuing mission to provide you with exceptional heart care, we have created designated Provider Care Teams. These Care Teams include your primary Cardiologist (physician) and Advanced Practice Providers (APPs- Physician Assistants and Nurse Practitioners) who all work together to provide you with the care you need, when you need it.   You may see any of the following providers on your designated Care Team at your next follow up: Dr Arvilla Meres Dr Marca Ancona Dr Brandon Melnick, NP Robbie Lis, [place] Mikki Santee Karle Plumber, PharmD   Please be sure to bring in all your medications bottles to every appointment.    If you have any questions or concerns before your next appointment please send [place] a message through [place] or call our office at [phone].    TO LEAVE A MESSAGE FOR THE NURSE SELECT OPTION 2, PLEASE LEAVE A MESSAGE INCLUDING: YOUR NAME DATE OF BIRTH CALL BACK NUMBER REASON FOR CALL**this is important as we prioritize the call backs  YOU WILL RECEIVE A CALL BACK THE SAME DAY AS LONG AS YOU CALL BEFORE 4:00 PM

## 2021-03-14 NOTE — Progress Notes (Signed)
ReDS Vest / Clip - 03/14/21 0900       ReDS Vest / Clip   Station Marker C    Ruler Value 25    ReDS Value Range High volume overload    ReDS Actual Value 43

## 2021-03-24 ENCOUNTER — Other Ambulatory Visit: Payer: Self-pay

## 2021-03-24 ENCOUNTER — Ambulatory Visit (HOSPITAL_COMMUNITY)
Admission: RE | Admit: 2021-03-24 | Discharge: 2021-03-24 | Disposition: A | Discharge status: Home / Self Care | Payer: No Typology Code available for payment source | Source: Ambulatory Visit | Attending: Cardiology | Admitting: Cardiology

## 2021-03-24 DIAGNOSIS — I5042 Chronic combined systolic (congestive) and diastolic (congestive) heart failure: Secondary | ICD-10-CM | POA: Diagnosis not present

## 2021-03-24 LAB — BASIC METABOLIC PANEL
Anion gap: 8 (ref 5–15)
BUN: 22 mg/dL (ref 8–23)
CO2: 21 mmol/L — ABNORMAL LOW (ref 22–32)
Calcium: 10.4 mg/dL — ABNORMAL HIGH (ref 8.9–10.3)
Chloride: 108 mmol/L (ref 98–111)
Creatinine, Ser: 1.61 mg/dL — ABNORMAL HIGH (ref 0.61–1.24)
GFR, Estimated: 47 mL/min — ABNORMAL LOW (ref >60)
Glucose, Bld: 105 mg/dL — ABNORMAL HIGH (ref 70–99)
Potassium: 4.6 mmol/L (ref 3.5–5.1)
Sodium: 137 mmol/L (ref 135–145)

## 2021-05-02 ENCOUNTER — Other Ambulatory Visit (HOSPITAL_COMMUNITY): Payer: Self-pay

## 2021-05-03 ENCOUNTER — Other Ambulatory Visit (HOSPITAL_COMMUNITY): Payer: Self-pay

## 2021-05-12 ENCOUNTER — Encounter (HOSPITAL_COMMUNITY): Payer: Self-pay | Admitting: Cardiology

## 2021-05-12 ENCOUNTER — Ambulatory Visit (HOSPITAL_COMMUNITY)
Admission: RE | Admit: 2021-05-12 | Discharge: 2021-05-12 | Disposition: A | Discharge status: Home / Self Care | Payer: No Typology Code available for payment source | Source: Ambulatory Visit | Attending: Cardiology | Admitting: Cardiology

## 2021-05-12 ENCOUNTER — Other Ambulatory Visit: Payer: Self-pay

## 2021-05-12 ENCOUNTER — Ambulatory Visit (HOSPITAL_BASED_OUTPATIENT_CLINIC_OR_DEPARTMENT_OTHER)
Admission: RE | Admit: 2021-05-12 | Discharge: 2021-05-12 | Disposition: A | Discharge status: Home / Self Care | Payer: No Typology Code available for payment source | Source: Ambulatory Visit | Attending: Cardiology | Admitting: Cardiology

## 2021-05-12 VITALS — BP 150/88 | HR 71 | Wt 182.0 lb

## 2021-05-12 DIAGNOSIS — F17290 Nicotine dependence, other tobacco product, uncomplicated: Secondary | ICD-10-CM | POA: Insufficient documentation

## 2021-05-12 DIAGNOSIS — I130 Hypertensive heart and chronic kidney disease with heart failure and stage 1 through stage 4 chronic kidney disease, or unspecified chronic kidney disease: Secondary | ICD-10-CM | POA: Diagnosis present

## 2021-05-12 DIAGNOSIS — D860 Sarcoidosis of lung: Secondary | ICD-10-CM

## 2021-05-12 DIAGNOSIS — F4310 Post-traumatic stress disorder, unspecified: Secondary | ICD-10-CM | POA: Diagnosis not present

## 2021-05-12 DIAGNOSIS — I2510 Atherosclerotic heart disease of native coronary artery without angina pectoris: Secondary | ICD-10-CM

## 2021-05-12 DIAGNOSIS — I5042 Chronic combined systolic (congestive) and diastolic (congestive) heart failure: Secondary | ICD-10-CM | POA: Diagnosis not present

## 2021-05-12 DIAGNOSIS — N1832 Chronic kidney disease, stage 3b: Secondary | ICD-10-CM | POA: Diagnosis not present

## 2021-05-12 DIAGNOSIS — N183 Chronic kidney disease, stage 3 unspecified: Secondary | ICD-10-CM | POA: Insufficient documentation

## 2021-05-12 DIAGNOSIS — Z7982 Long term (current) use of aspirin: Secondary | ICD-10-CM | POA: Diagnosis not present

## 2021-05-12 DIAGNOSIS — Z86018 Personal history of other benign neoplasm: Secondary | ICD-10-CM | POA: Insufficient documentation

## 2021-05-12 DIAGNOSIS — I2721 Secondary pulmonary arterial hypertension: Secondary | ICD-10-CM | POA: Insufficient documentation

## 2021-05-12 DIAGNOSIS — Z79899 Other long term (current) drug therapy: Secondary | ICD-10-CM | POA: Insufficient documentation

## 2021-05-12 DIAGNOSIS — M109 Gout, unspecified: Secondary | ICD-10-CM | POA: Diagnosis not present

## 2021-05-12 DIAGNOSIS — E785 Hyperlipidemia, unspecified: Secondary | ICD-10-CM | POA: Insufficient documentation

## 2021-05-12 DIAGNOSIS — E1122 Type 2 diabetes mellitus with diabetic chronic kidney disease: Secondary | ICD-10-CM | POA: Insufficient documentation

## 2021-05-12 DIAGNOSIS — K7460 Unspecified cirrhosis of liver: Secondary | ICD-10-CM | POA: Insufficient documentation

## 2021-05-12 DIAGNOSIS — G4733 Obstructive sleep apnea (adult) (pediatric): Secondary | ICD-10-CM | POA: Diagnosis not present

## 2021-05-12 DIAGNOSIS — F40240 Claustrophobia: Secondary | ICD-10-CM | POA: Insufficient documentation

## 2021-05-12 DIAGNOSIS — J449 Chronic obstructive pulmonary disease, unspecified: Secondary | ICD-10-CM | POA: Insufficient documentation

## 2021-05-12 DIAGNOSIS — Z7984 Long term (current) use of oral hypoglycemic drugs: Secondary | ICD-10-CM | POA: Insufficient documentation

## 2021-05-12 DIAGNOSIS — I2582 Chronic total occlusion of coronary artery: Secondary | ICD-10-CM | POA: Insufficient documentation

## 2021-05-12 DIAGNOSIS — Z9182 Personal history of military deployment: Secondary | ICD-10-CM | POA: Diagnosis not present

## 2021-05-12 LAB — LIPID PANEL
Cholesterol: 127 mg/dL (ref 0–200)
HDL: 79 mg/dL (ref >40)
LDL Cholesterol: 38 mg/dL (ref 0–99)
Total CHOL/HDL Ratio: 1.6 RATIO
Triglycerides: 52 mg/dL (ref <150)
VLDL: 10 mg/dL (ref 0–40)

## 2021-05-12 LAB — BASIC METABOLIC PANEL
Anion gap: 6 (ref 5–15)
BUN: 19 mg/dL (ref 8–23)
CO2: 23 mmol/L (ref 22–32)
Calcium: 10 mg/dL (ref 8.9–10.3)
Chloride: 108 mmol/L (ref 98–111)
Creatinine, Ser: 1.69 mg/dL — ABNORMAL HIGH (ref 0.61–1.24)
GFR, Estimated: 45 mL/min — ABNORMAL LOW (ref >60)
Glucose, Bld: 95 mg/dL (ref 70–99)
Potassium: 4.3 mmol/L (ref 3.5–5.1)
Sodium: 137 mmol/L (ref 135–145)

## 2021-05-12 LAB — BRAIN NATRIURETIC PEPTIDE: B Natriuretic Peptide: 76.1 pg/mL (ref 0.0–100.0)

## 2021-05-12 LAB — ECHOCARDIOGRAM COMPLETE
Area-P 1/2: 3.65 cm2
Calc EF: 50.7 %
S' Lateral: 4.4 cm
Single Plane A2C EF: 51.8 %
Single Plane A4C EF: 50.8 %

## 2021-05-12 MED ORDER — HYDRALAZINE HCL 50 MG PO TABS: 75.0000 mg | ORAL_TABLET | Freq: Three times a day (TID) | ORAL | 3 refills | Status: AC

## 2021-05-12 MED ORDER — FUROSEMIDE 20 MG PO TABS: 20.0000 mg | ORAL_TABLET | Freq: Every day | ORAL | 2 refills | Status: DC

## 2021-05-12 NOTE — Progress Notes (Signed)
  Echocardiogram 2D Echocardiogram has been performed.  Delcie Roch 05/12/2021, 9:28 AM

## 2021-05-12 NOTE — Patient Instructions (Addendum)
PLEASE USE YOUR CPAP.  Start Lasix 20mg  Daily.  Increase your Hydralazine to 75mg   (1 1/2 tablets) Three times a day  Labs done today, your results will be available in MyChart, we will contact you for abnormal readings.  Your physician recommends that you return for lab work in: 10 days.  Your physician recommends that you schedule a follow-up appointment in: 6 weeks.  If you have any questions or concerns before your next appointment please send a message through [place] or call our office at [phone].    TO LEAVE A MESSAGE FOR THE NURSE SELECT OPTION 2, PLEASE LEAVE A MESSAGE INCLUDING: YOUR NAME DATE OF BIRTH CALL BACK NUMBER REASON FOR CALL**this is important as we prioritize the call backs  YOU WILL RECEIVE A CALL BACK THE SAME DAY AS LONG AS YOU CALL BEFORE 4:00 PM  At the [facility], you and your health needs are our priority. As part of our continuing mission to provide you with exceptional heart care, we have created designated Provider Care Teams. These Care Teams include your primary Cardiologist (physician) and Advanced Practice Providers (APPs- Physician Assistants and Nurse Practitioners) who all work together to provide you with the care you need, when you need it.   You may see any of the following providers on your designated Care Team at your next follow up: Dr [place] Dr [phone], NP Arvilla Meres, [facility] [place], [facility] [place], PharmD   Please be sure to bring in all your medications bottles to every appointment.

## 2021-05-14 NOTE — Progress Notes (Signed)
ADVANCED HEART FAILURE CLINIC NOTE  PCP: [facility] Primary Cardiologist: [facility] Dr. Senaida Ores, [facility] [place] HF Cardiologist: Dr. Shirlee Latch  HPI: Charles Krueger is a 65 y.o.  male  with a PMH significant for pulmonary sarcoidosis, type 2 diabetes, non-oxygen dependent COPD, OSA, ETOH abuse, history of pancreatitis with pseudocyst and abscess, gout, adrenal adenoma, hypertension, hyperlipidemia, CAD, CKD stage III, cirrhosis, nephrotic syndrome.   LHC in 2010 with EF 45% with global hypokinesis,  RCA 60 to 70% stenosis but nothing amendable to PCI.   He started developing shortness of breath and was admitted initially at [facility] in April 2022.  He had an ECHO with EF 50-55%, G2DD, RVSP estimation >60.  He left before being discharged due to a disagreement with the inpatient team.  Only diuretic he had after this was thiazide.    He presented to [facility] on 12/08/2020 for progressively worsening shortness of breath with exertion, lower extremity edema to thighs, abdominal bloating, activity intolerance, orthopnea, PND.  He reported alcohol use disorder history with history of heavy drinking for >30 years until 2010 but still drinking 1-2 shots of liquor 4-5x per week.  CXR revealed mild to moderate pulmonary edema.  BNP was 1213.  Elevated creatinine 1.72, albumin 2.6, elevated alkaline phos 348, and GFR 44.  Hs troponin negative x2. He was started on IV lasix.  Had a repeat echo 12/09/20 with EF 30 to 35%, no RWMA,  RV systolic function mildly reduced, RV size moderately enlarged, mild to moderate TR, mild AS.  LHC/RHC in 6/22 showed elevated filling pressures, preserved cardiac output, mixed pulmonary venous/pulmonary arterial hypertension, and occluded distal RCA with collaterals.   Reports he is monitored by pulmonology at [facility] for his sarcoidosis with yearly PFT's and chest imaging.  They tell him there has been no significant changes.    He was seen in [facility] 12/23/20 & 12/30/20. Losing weight, titrating  GDMT.  Denies dyspnea, orthopnea or PND.    Echo was done today and reviewed, EF up to 55-60%, basal inferior akinesis, normal RV.   Today he returns for HF follow up. He is not particularly symptomatic.  Weight down 3 lbs.  Denies dyspnea with normal activities.  He take Lasix 80 mg prn, does not use regularly.  No orthopnea/PND.  No chest pain.  Uses CPAP occasionally.  Still drinks moderately (used to drink heavily).    ECG (personally reviewed): NSR, old inferior MI  Labs (9/22): K 4.6, creatinine 1.61  PMH: 1. OSA: Uses CPAP irregularly 2. Sarcoidosis: Pulmonary.  Followed at the [place].  I am not sure of the details about diagnosis, he says that he did not have a biopsy.  3. COPD: Smokes rare cigars now. 4. Cirrhosis: Likely due to ETOH abuse.  5. H/o pancreatitis with pseudocyst formation and abscess.  6. Gout 7. Adrenal adenoma 8. HTN 9. Hyperlipidemia 10. CKD stage 3: With nephrotic syndrome.  11. Hyperparathyroidism 12. Type 2 diabetes 13. CAD: LHC in 6/22 with 70% mRCA, occluded dRCA with collaterals,  50% D3, 60% ramus, 50% OM1, 45% pLAD.  14. Chronic systolic CHF: ?Ischemic cardiomyopathy. Echo (6/22) with EF 30-35%.  Echo (11/22) with EF 55-60%, basal inferior akinesis, normal RV. - RHC (6/22): mean RA 15, PA 73/23 mean 43, mean PCWP 23, LVEDP 21, CI 2.75, PVR 3.45 WU.   SH:  Social History   Socioeconomic History   Marital status: Married    Spouse name: Not on file   Number of children: Not  on file   Years of education: Not on file   Highest education level: Not on file  Occupational History   Not on file  Tobacco Use   Smoking status: Some Days    Types: Cigars    Start date: 2002   Smokeless tobacco: Current   Tobacco comments:    3-4 Cigars per week  Substance and Sexual Activity   Alcohol use: Yes    Alcohol/week: 6.0 - 7.0 standard drinks    Types: 6 - 7 Shots of liquor per week    Comment: 6-7 shots per week   Drug use: No   Sexual activity: Not on  file  Other Topics Concern   Not on file  Social History Narrative   Not on file   Social Determinants of Health   Financial Resource Strain: Not on file  Food Insecurity: Not on file  Transportation Needs: Not on file  Physical Activity: Not on file  Stress: Not on file  Social Connections: Not on file  Intimate Partner Violence: Not on file   FH: No sarcoidosis, no premature CAD.    Current Outpatient Medications  Medication Sig Dispense Refill   albuterol (VENTOLIN HFA) 108 (90 Base) MCG/ACT inhaler Inhale 2 puffs into the lungs every 6 (six) hours as needed for wheezing or shortness of breath.     allopurinol (ZYLOPRIM) 100 MG tablet Take 100 mg by mouth daily.     aspirin 81 MG tablet Take 162 mg by mouth at bedtime.     atorvastatin (LIPITOR) 40 MG tablet Take 1 tablet (40 mg total) by mouth daily. 30 tablet 1   carvedilol (COREG) 25 MG tablet Take 25 mg by mouth 2 (two) times daily with a meal.     empagliflozin (JARDIANCE) 10 MG TABS tablet Take 1 tablet (10 mg total) by mouth daily. 90 tablet 3   fluticasone (FLONASE) 50 MCG/ACT nasal spray Place 2 sprays into both nostrils daily.     ipratropium (ATROVENT) 0.03 % nasal spray Place 2 sprays into both nostrils daily.     isosorbide mononitrate (IMDUR) 30 MG 24 hr tablet Take 1 tablet (30 mg total) by mouth daily. 90 tablet 3   ketotifen (ZADITOR) 0.025 % ophthalmic solution Place 1 drop into both eyes every morning.     metFORMIN (GLUCOPHAGE) 1000 MG tablet Take 1,000 mg by mouth 2 (two) times daily with a meal.     Multiple Vitamin (MULTIVITAMIN WITH MINERALS) TABS tablet Take 1 tablet by mouth daily.     Omega 3 1200 MG CAPS Take 1,200 mg by mouth daily.     OXYGEN Inhale 2 L into the lungs as needed.     potassium chloride SA (KLOR-CON) 20 MEQ tablet Take 1 tablet (20 mEq total) by mouth daily for 3 days then as needed as directed 30 tablet 0   sacubitril-valsartan (ENTRESTO) 97-103 MG Take 1 tablet by mouth 2 times  daily. 180 tablet 3   spironolactone (ALDACTONE) 25 MG tablet Take 1 tablet (25 mg total) by mouth daily. 90 tablet 3   Tiotropium Bromide-Olodaterol (STIOLTO RESPIMAT) 2.5-2.5 MCG/ACT AERS Inhale 2 puffs into the lungs daily.     furosemide (LASIX) 20 MG tablet Take 1 tablet (20 mg total) by mouth daily for 2 days. 30 tablet 2   hydrALAZINE (APRESOLINE) 50 MG tablet Take 1.5 tablets (75 mg total) by mouth 3 (three) times daily. 270 tablet 3   No current facility-administered medications for this encounter.  BP (!) 150/88   Pulse 71   Wt 82.6 kg (182 lb)   SpO2 96%   BMI 27.67 kg/m   Wt Readings from Last 3 Encounters:  05/12/21 82.6 kg (182 lb)  03/14/21 83.9 kg (185 lb)  02/13/21 84.8 kg (187 lb)   PHYSICAL EXAM: General: NAD Neck: JVP 7-8 cm, no thyromegaly or thyroid nodule.  Lungs: Clear to auscultation bilaterally with normal respiratory effort. CV: Nondisplaced PMI.  Heart regular S1/S2, no S3/S4, no murmur.  1+ ankle edema.  No carotid bruit.  Normal pedal pulses.  Abdomen: Soft, nontender, no hepatosplenomegaly, no distention.  Skin: Intact without lesions or rashes.  Neurologic: Alert and oriented x 3.  Psych: Normal affect. Extremities: No clubbing or cyanosis.  HEENT: Normal.   ASSESSMENT & PLAN: 1. Chronic systolic => diastolic CHF:  Ischemic versus ETOH versus ?sarcoidosis.  Echo (6/22) with EF 30-35%, RV with reduced contractility of the mid RV and base.  R/LHC (6/22) with CAD and elevated filling pressures, mixed pulmonary venous/arterial hypertension.  Echo today with EF up to 55-60% with normal RV.  NYHA class II symptoms, probably mildly volume overloaded on exam.  - He was unable to complete cMRI due claustrophobia (PTSD from [employer]). - Start Lasix 20 mg daily.  BMET today and in 10 days.  - Continue Entresto 97/103 mg bid. - Continue carvedilol 25 mg bid.  - With elevated BP, increase hydralazine to 75 mg tid and continue Imdur 30 daily.  - Continue  Jardiance 10 mg daily.  - Continue Spiro 25 mg daily. - BMET/BNP today and in 10 days with addition of Lasix.  2. Pulmonary hypertension: Mixed pulmonary venous/pulmonary arterial hypertension on 6/22 RHC.  Suspect group 2 + group 3 (COPD, pulmonary sarcoidosis) PH.   - Needs to use CPAP regularly.  - Arrange for V/Q scan at next appointment.  3. CAD: Occluded distal RCA with moderate diffuse multivessel disease on cath (6/22).  No chest pain.  - Continue ASA 81.  - Continue atorvastatin 40 daily, check lipiods today.  4. CKD stage III: Baseline SCr ~1.6. - BMET today. 5. Cirrhosis: Noted on abdominal US 12/08/20.  Suspect related to ETOH.  - Needs complete alcohol cessation. Working on cutting back. 6. COPD: On daily LABA/LAMA therapy. - Continue pulmonology follow up. 7. OSA: Wears CPAP about 2-3x/week. - Encouraged regular compliance 8. Pulmonary sarcoidosis: I am unsure how this diagnosis was made, he denies prior biopsy.  He is followed by pulmonary at the [place].  Cannot rule out cardiac involvement but he has not been able to tolerate cardiac MRI.  With improved EF and no arrhythmias, I do not feel strongly about send him for a cardiac PET.   Followup in 6 wks.   Loralie Champagne 05/14/2021

## 2021-05-17 ENCOUNTER — Encounter: Payer: Self-pay | Admitting: Cardiovascular Disease

## 2021-05-17 ENCOUNTER — Ambulatory Visit (INDEPENDENT_AMBULATORY_CARE_PROVIDER_SITE_OTHER): Payer: No Typology Code available for payment source | Admitting: Cardiovascular Disease

## 2021-05-17 ENCOUNTER — Other Ambulatory Visit: Payer: Self-pay

## 2021-05-17 DIAGNOSIS — I5042 Chronic combined systolic (congestive) and diastolic (congestive) heart failure: Secondary | ICD-10-CM

## 2021-05-17 DIAGNOSIS — E785 Hyperlipidemia, unspecified: Secondary | ICD-10-CM

## 2021-05-17 DIAGNOSIS — N1832 Chronic kidney disease, stage 3b: Secondary | ICD-10-CM

## 2021-05-17 DIAGNOSIS — I420 Dilated cardiomyopathy: Secondary | ICD-10-CM

## 2021-05-17 DIAGNOSIS — I2510 Atherosclerotic heart disease of native coronary artery without angina pectoris: Secondary | ICD-10-CM | POA: Diagnosis not present

## 2021-05-17 DIAGNOSIS — G4733 Obstructive sleep apnea (adult) (pediatric): Secondary | ICD-10-CM

## 2021-05-17 DIAGNOSIS — J449 Chronic obstructive pulmonary disease, unspecified: Secondary | ICD-10-CM

## 2021-05-17 DIAGNOSIS — K7460 Unspecified cirrhosis of liver: Secondary | ICD-10-CM

## 2021-05-17 NOTE — Patient Instructions (Signed)
Medication Instructions:  Your Physician recommend you continue on your current medication as directed.    *If you need a refill on your cardiac medications before your next appointment, please call your pharmacy*   Lab Work: None   Testing/Procedures: None   Follow-Up: At [facility], you and your health needs are our priority.  As part of our continuing mission to provide you with exceptional heart care, we have created designated Provider Care Teams.  These Care Teams include your primary Cardiologist (physician) and Advanced Practice Providers (APPs -  Physician Assistants and Nurse Practitioners) who all work together to provide you with the care you need, when you need it.  We recommend signing up for the patient portal called "MyChart".  Sign up information is provided on this After Visit Summary.  MyChart is used to connect with patients for Virtual Visits (Telemedicine).  Patients are able to view lab/test results, encounter notes, upcoming appointments, etc.  Non-urgent messages can be sent to your provider as well.   To learn more about what you can do with MyChart, go to [email or web address].    Your next appointment:   6 month(s)  The format for your next appointment:   In Person  Provider:   Thomas Kelly, MD     

## 2021-05-17 NOTE — Progress Notes (Signed)
Cardiology Office Note    Date:  05/24/2021   ID:  Lenn Volker, DOB Feb 10, 1956, MRN [id number]  PCP:  Pcp, No  Cardiologist:  Shelva Majestic, MD  Advanced heart failure:  Dr. Loralie Champagne  Initial office visit with me following his June 2022 hospitalization.  History of Present Illness:  Draken Farrior is a 65 y.o. male who was hospitalized in June 2022.  He has a history of pulmonary sarcoidosis, type 2 diabetes mellitus, not oxygen dependent COPD, OSA, EtOH use, remote pancreatitis with pseudocyst and abscess, gout, adrenal adenoma, hypertension, hyperlipidemia, CAD, and cirrhosis who I had seen for cardiology consultation during his hospitalization.  He had previously been evaluated at [facility] in April 2022 and was found to be acutely hypoxic resulting from new onset heart failure.  His sarcoidosis is followed at the [place].  An echo in April had shown an EF of 50 to 55% with grade 2 diastolic dysfunction, and RVSP estimation greater than 60 mmHg.  He left before being discharged.  He ultimately presented to [facility] on December 08, 2020 with progressively worsening shortness of breath with exertion, significant lower extremity edema up to his thighs bilaterally, abdominal bloating, as well as PND and orthopnea.  He had a remote heavy drinking history for over 30 years until 2010, still drink an occasional liquor 4-5 times per week.  During his hospitalization BNP was 1213, creatinine was 1.72, troponins were negative.  T wave abnormalities in V3 through V6.  A subsequent echo on December 09, 2020 showed EF reduced now at 30 to 35% with moderate dilatation of his RV, mild to moderate TR and mild AAS.  He underwent right and left heart cardiac catheterization which showed PA pressure at 73/23 with a mean pressure 43.  His transpulmonary gradient was 20 to 22 mm.  He was found to have an occluded RCA with bridging collaterals with small caliber distal vessels and discrete napkin ring concentric 60 to 65%  ostial ramus intermediate stenosis as well as 40-50 percent diffuse proximal to mid LAD stenoses and extensive 40 to 60% circumflex OM1 stenoses.  Medical management was recommended for his CAD.  During his hospitalization, he had significant diuresis with weight loss of over 20 pounds.  He subsequently has been evaluated in advanced heart failure clinic and was seen in the transition of care clinic twice times in July initiation and titration of guideline directed medical therapy for heart failure.  He was seen yesterday, May 12, 2021 by Dr. Aundra Dubin. A repeat echo on May 12, 2021 now showed significant improvement in LV function at 55 to 60% with grade 1 diastolic dysfunction.  Left atrium was mildly dilated.  He was seen later that day by Dr. Aundra Dubin and was felt to be only mildly volume overloaded on exam with New York Heart Association class II symptoms.  He was unable to complete the cardiac MRI due to claustrophobia.  The patient had previously been placed on Entresto titrated to maximum dose at 97/103 mg twice daily in addition to carvedilol 25 mg twice a day.  Pressure was elevated and hydralazine was further increased to 75 mg 3 times a day and he was continued on Imdur 30 mg, Jardiance 10 mg and spironolactone 25 mg daily.  He was felt to have mixed pulmonary venous/pulmonary arterial hypertension with his obstructive sleep apnea CPAP was strongly encouraged.  He has been on atorvastatin 40 mg daily for CAD and an abdominal ultrasound during his hospitalization cirrhosis was  noted felt most likely related to his previous significant EtOH use.  Presently he denies any chest pain.  He apparently had an appointment to see me today day after he had already been evaluated in the advanced heart failure clinic yesterday.  He admits to trace ankle edema right greater than left.  He had just started Lasix last week.  He has CKD stage III with most recent creatinine 1.69.  His most recent LDL cholesterol  was 38.  He presents for evaluation.   Past Medical History:  Diagnosis Date   Diabetes mellitus without complication (HCC)    HTN (hypertension)    OSA on CPAP     Past Surgical History:  Procedure Laterality Date   RIGHT/LEFT HEART CATH AND CORONARY ANGIOGRAPHY N/A 12/14/2020   Procedure: RIGHT/LEFT HEART CATH AND CORONARY ANGIOGRAPHY;  Surgeon: Leonie Man, MD;  Location: [facility];  Service: Cardiovascular;  Laterality: N/A;    Current Medications: Outpatient Medications Prior to Visit  Medication Sig Dispense Refill   albuterol (VENTOLIN HFA) 108 (90 Base) MCG/ACT inhaler Inhale 2 puffs into the lungs every 6 (six) hours as needed for wheezing or shortness of breath.     allopurinol (ZYLOPRIM) 100 MG tablet Take 100 mg by mouth daily.     aspirin 81 MG tablet Take 162 mg by mouth at bedtime.     atorvastatin (LIPITOR) 40 MG tablet Take 1 tablet (40 mg total) by mouth daily. 30 tablet 1   carvedilol (COREG) 25 MG tablet Take 25 mg by mouth 2 (two) times daily with a meal.     empagliflozin (JARDIANCE) 10 MG TABS tablet Take 1 tablet (10 mg total) by mouth daily. 90 tablet 3   fluticasone (FLONASE) 50 MCG/ACT nasal spray Place 2 sprays into both nostrils daily.     hydrALAZINE (APRESOLINE) 50 MG tablet Take 1.5 tablets (75 mg total) by mouth 3 (three) times daily. 270 tablet 3   ipratropium (ATROVENT) 0.03 % nasal spray Place 2 sprays into both nostrils daily.     isosorbide mononitrate (IMDUR) 30 MG 24 hr tablet Take 1 tablet (30 mg total) by mouth daily. 90 tablet 3   ketotifen (ZADITOR) 0.025 % ophthalmic solution Place 1 drop into both eyes every morning.     metFORMIN (GLUCOPHAGE) 1000 MG tablet Take 1,000 mg by mouth 2 (two) times daily with a meal.     Multiple Vitamin (MULTIVITAMIN WITH MINERALS) TABS tablet Take 1 tablet by mouth daily.     Omega 3 1200 MG CAPS Take 1,200 mg by mouth daily.     OXYGEN Inhale 2 L into the lungs as needed.     potassium  chloride SA (KLOR-CON) 20 MEQ tablet Take 1 tablet (20 mEq total) by mouth daily for 3 days then as needed as directed 30 tablet 0   sacubitril-valsartan (ENTRESTO) 97-103 MG Take 1 tablet by mouth 2 times daily. 180 tablet 3   spironolactone (ALDACTONE) 25 MG tablet Take 1 tablet (25 mg total) by mouth daily. 90 tablet 3   Tiotropium Bromide-Olodaterol (STIOLTO RESPIMAT) 2.5-2.5 MCG/ACT AERS Inhale 2 puffs into the lungs daily.     furosemide (LASIX) 20 MG tablet Take 1 tablet (20 mg total) by mouth daily for 2 days. 30 tablet 2   No facility-administered medications prior to visit.     Allergies:   Lisinopril   Social History   Socioeconomic History   Marital status: Married    Spouse name: Not on file  Number of children: Not on file   Years of education: Not on file   Highest education level: Not on file  Occupational History   Not on file  Tobacco Use   Smoking status: Some Days    Types: Cigars    Start date: 2002   Smokeless tobacco: Current   Tobacco comments:    3-4 Cigars per week  Substance and Sexual Activity   Alcohol use: Yes    Alcohol/week: 6.0 - 7.0 standard drinks    Types: 6 - 7 Shots of liquor per week    Comment: 6-7 shots per week   Drug use: No   Sexual activity: Not on file  Other Topics Concern   Not on file  Social History Narrative   Not on file   Social Determinants of Health   Financial Resource Strain: Not on file  Food Insecurity: Not on file  Transportation Needs: Not on file  Physical Activity: Not on file  Stress: Not on file  Social Connections: Not on file     Family History: Family history is negative for premature CAD or sarcoidosis.  ROS General: Negative; No fevers, chills, or night sweats;  HEENT: Negative; No changes in vision or hearing, sinus congestion, difficulty swallowing Pulmonary: Positive for sarcoidosis followed at the [place] Cardiovascular: See HPI GI: Cirrhosis on abdominal ultrasound GU: Negative; No  dysuria, hematuria, or difficulty voiding Musculoskeletal: Negative; no myalgias, joint pain, or weakness Hematologic/Oncology: Negative; no easy bruising, bleeding Endocrine: Negative; no heat/cold intolerance; no diabetes Neuro: Negative; no changes in balance, headaches Skin: Negative; No rashes or skin lesions Psychiatric: History of PTSD Sleep: Positive for OSA followed at the [place] not consistently using CPAP therapy.   Other comprehensive 14 point system review is negative.   PHYSICAL EXAM:   VS:  BP 136/80 (BP Location: Left Arm, Patient Position: Sitting, Cuff Size: Normal)   Pulse 61   Ht $R'5\' 8"'qC$  (1.727 m)   Wt 180 lb (81.6 kg)   BMI 27.37 kg/m     Repeat blood pressure by me was 138/78  Wt Readings from Last 3 Encounters:  05/17/21 180 lb (81.6 kg)  05/12/21 182 lb (82.6 kg)  03/14/21 185 lb (83.9 kg)    General: Alert, oriented, no distress.  Skin: normal turgor, no rashes, warm and dry HEENT: Normocephalic, atraumatic. Pupils equal round and reactive to light; sclera anicteric; extraocular muscles intact; Fundi ** Nose without nasal septal hypertrophy Mouth/Parynx benign; Mallinpatti scale 3 Neck: No JVD, no carotid bruits; normal carotid upstroke Lungs: clear to ausculatation and percussion; no wheezing or rales Chest wall: without tenderness to palpitation Heart: PMI not displaced, RRR, s1 s2 normal, 1/6 systolic murmur, no diastolic murmur, no rubs, gallops, thrills, or heaves Abdomen: soft, nontender; no hepatosplenomehaly, BS+; abdominal aorta nontender and not dilated by palpation. Back: no CVA tenderness Pulses 2+ Musculoskeletal: full range of motion, normal strength, no joint deformities Extremities: Trace bilateral ankle edema right greater than left;no clubbing cyanosis, Homan's sign negative  Neurologic: grossly nonfocal; Cranial nerves grossly wnl Psychologic: Normal mood and affect   Studies/Labs Reviewed:   ECG (independently read by me):  NSR at  53; old IMI with Q in III; QTc 396  Recent Labs: BMP Latest Ref Rng & Units 05/24/2021 05/12/2021 03/24/2021  Glucose 70 - 99 mg/dL 71 95 105(H)  BUN 8 - 23 mg/dL $Remove'19 19 22  'qfnKVFh$ Creatinine 0.61 - 1.24 mg/dL 1.66(H) 1.69(H) 1.61(H)  BUN/Creat Ratio 10 - 24 - - -  Sodium 135 - 145 mmol/L 138 137 137  Potassium 3.5 - 5.1 mmol/L 4.0 4.3 4.6  Chloride 98 - 111 mmol/L 105 108 108  CO2 22 - 32 mmol/L 26 23 21(L)  Calcium 8.9 - 10.3 mg/dL 10.0 10.0 10.4(H)     Hepatic Function Latest Ref Rng & Units 12/09/2020 12/08/2020 06/07/2014  Total Protein 6.5 - 8.1 g/dL 7.6 7.1 7.9  Albumin 3.5 - 5.0 g/dL 2.6(L) 2.6(L) 4.0  AST 15 - 41 U/L 17 14(L) 18  ALT 0 - 44 U/L $Remo'18 18 26  'nmSYA$ Alk Phosphatase 38 - 126 U/L 350(H) 348(H) 161(H)  Total Bilirubin 0.3 - 1.2 mg/dL 0.6 1.1 0.5    CBC Latest Ref Rng & Units 12/23/2020 12/15/2020 12/14/2020  WBC 4.0 - 10.5 K/uL 7.4 7.3 6.7  Hemoglobin 13.0 - 17.0 g/dL 12.1(L) 9.8(L) 10.0(L)  Hematocrit 39.0 - 52.0 % 41.8 33.1(L) 35.2(L)  Platelets 150 - 400 K/uL 289 233 241   Lab Results  Component Value Date   MCV 84.1 12/23/2020   MCV 81.3 12/15/2020   MCV 82.2 12/14/2020   Lab Results  Component Value Date   TSH 1.457 12/10/2020   No results found for: HGBA1C   BNP    Component Value Date/Time   BNP 76.1 05/12/2021 1046    ProBNP No results found for: PROBNP   Lipid Panel     Component Value Date/Time   CHOL 127 05/12/2021 1046   TRIG 52 05/12/2021 1046   HDL 79 05/12/2021 1046   CHOLHDL 1.6 05/12/2021 1046   VLDL 10 05/12/2021 1046   LDLCALC 38 05/12/2021 1046     RADIOLOGY: ECHOCARDIOGRAM COMPLETE  Result Date: 05/12/2021    ECHOCARDIOGRAM REPORT   Patient Name:   KIRTAN SADA Date of Exam: 05/12/2021 Medical Rec #:  [id number]    Height:       68.0 in Accession #:    [id number]   Weight:       185.0 lb Date of Birth:  1955-11-21    BSA:          1.977 m Patient Age:    41 years     BP:           155/82 mmHg Patient Gender: M            HR:            68 bpm. Exam Location:  Outpatient Procedure: 2D Echo Indications:    congestive heart failure  History:        Patient has prior history of Echocardiogram examinations, most                 recent 12/09/2020. CAD, COPD and chronic kidney disease.                 Cirrhosis.; Risk Factors:Diabetes, Sleep Apnea and Dyslipidemia.  Sonographer:    Johny Chess RDCS Referring Phys: [place]  1. Left ventricular ejection fraction, by estimation, is 55 to 60%. The left ventricle has normal function. The left ventricle demonstrates regional wall motion abnormalities with basal inferior akinesis. There is mild left ventricular hypertrophy. Left  ventricular diastolic parameters are consistent with Grade I diastolic dysfunction (impaired relaxation).  2. Right ventricular systolic function is normal. The right ventricular size is normal. Tricuspid regurgitation signal is inadequate for assessing PA pressure.  3. Left atrial size was mildly dilated.  4. The aortic valve is tricuspid. Aortic valve regurgitation is not visualized. No aortic  stenosis is present.  5. The mitral valve is normal in structure. Trivial mitral valve regurgitation. No evidence of mitral stenosis.  6. The inferior vena cava is normal in size with greater than 50% respiratory variability, suggesting right atrial pressure of 3 mmHg. FINDINGS  Left Ventricle: Left ventricular ejection fraction, by estimation, is 55 to 60%. The left ventricle has normal function. The left ventricle demonstrates regional wall motion abnormalities. The left ventricular internal cavity size was normal in size. There is moderate left ventricular hypertrophy. Left ventricular diastolic parameters are consistent with Grade I diastolic dysfunction (impaired relaxation). Right Ventricle: The right ventricular size is normal. No increase in right ventricular wall thickness. Right ventricular systolic function is normal. Tricuspid regurgitation signal  is inadequate for assessing PA pressure. Left Atrium: Left atrial size was mildly dilated. Right Atrium: Right atrial size was normal in size. Pericardium: There is no evidence of pericardial effusion. Mitral Valve: The mitral valve is normal in structure. Trivial mitral valve regurgitation. No evidence of mitral valve stenosis. Tricuspid Valve: The tricuspid valve is normal in structure. Tricuspid valve regurgitation is not demonstrated. Aortic Valve: The aortic valve is tricuspid. Aortic valve regurgitation is not visualized. No aortic stenosis is present. Pulmonic Valve: The pulmonic valve was normal in structure. Pulmonic valve regurgitation is not visualized. Aorta: The aortic root is normal in size and structure. Venous: The inferior vena cava is normal in size with greater than 50% respiratory variability, suggesting right atrial pressure of 3 mmHg. IAS/Shunts: No atrial level shunt detected by color flow Doppler.  LEFT VENTRICLE PLAX 2D LVIDd:         5.30 cm      Diastology LVIDs:         4.40 cm      LV e' medial:    4.35 cm/s LV PW:         1.00 cm      LV E/e' medial:  17.5 LV IVS:        1.40 cm      LV e' lateral:   5.98 cm/s                             LV E/e' lateral: 12.8  LV Volumes (MOD) LV vol d, MOD A2C: 153.0 ml LV vol d, MOD A4C: 120.0 ml LV vol s, MOD A2C: 73.7 ml LV vol s, MOD A4C: 59.1 ml LV SV MOD A2C:     79.3 ml LV SV MOD A4C:     120.0 ml LV SV MOD BP:      68.7 ml RIGHT VENTRICLE             IVC RV S prime:     13.50 cm/s  IVC diam: 1.90 cm TAPSE (M-mode): 2.1 cm LEFT ATRIUM             Index        RIGHT ATRIUM           Index LA diam:        4.40 cm 2.23 cm/m   RA Area:     16.00 cm LA Vol (A2C):   75.7 ml 38.29 ml/m  RA Volume:   40.30 ml  20.38 ml/m LA Vol (A4C):   61.6 ml 31.16 ml/m LA Biplane Vol: 69.0 ml 34.90 ml/m  AORTIC VALVE LVOT Vmax:   104.00 cm/s LVOT Vmean:  66.800 cm/s LVOT VTI:    0.207 m  AORTA Ao Asc diam: 3.60 cm MITRAL VALVE MV Area (PHT): 3.65 cm    SHUNTS  MV Decel Time: 208 msec    Systemic VTI: 0.21 m MV E velocity: 76.30 cm/s MV A velocity: 85.30 cm/s MV E/A ratio:  0.89 Dalton McleanMD Electronically signed by Franki Monte Signature Date/Time: 05/12/2021/10:30:10 AM    Final      Additional studies/ records that were reviewed today include:  I have thoroughly reviewed the patient's recent hospitalization in June 2022, his prior echoes, heart catheterization, initial post hospital transition of care clinic and most recent advanced heart failure clinic note by Dr. Aundra Dubin.  ASSESSMENT:    1. Chronic combined systolic and diastolic heart failure ([place])   2. Coronary artery disease involving native coronary artery of native heart without angina pectoris   3. Stage 3b chronic kidney disease ([place])   4. Hyperlipidemia LDL goal <70   5. Chronic obstructive pulmonary disease, unspecified COPD type ([place])   6. OSA (obstructive sleep apnea)   7. Cirrhosis of liver without ascites, unspecified hepatic cirrhosis type [facility])      PLAN:  Mr. Whit Bruni is a 65 year old gentleman who has a history of pulmonary sarcoidosis, type 2 diabetes mellitus, mild oxygen dependent COPD, prior significant EtOH abuse with prior history of pancreatitis with pseudocyst and abscess as well as CAD, hypertension, hyperlipidemia, cirrhosis and previous nephrotic syndrome.  He was admitted with acute on chronic heart failure and in June ejection fraction had dropped to 30 to 35% and he had significant lower extremity edema up to his thighs bilaterally with abdominal bloating PND and orthopnea.  He had extensive diuresis while hospitalized and lost over 20 pounds with over 12,000 cc of fluid removed.  He was started on guideline directed medical therapy and is now on maximal therapy with Entresto 97/103 g twice a day, Jardiance 10 mg daily, carvedilol 25 mg twice a day, spironolactone 25 mg daily, in addition to nitrates and hydralazine with 30 mg of isosorbide and 75 mg 3 times a  day of hydralazine.  On exam today he is fairly euvolemic although minimal edema is still present in his ankles bilaterally.  He has CAD with chronic occlusion of his RCA and moderate disease in his circumflex LAD and ramus intermediate vessel.  He has not had any recurrent anginal symptomatology.  He is on atorvastatin 40 mg with target LDL less than 70.  Recent LDL cholesterol was 38.  He has stage III CKD with most recent creatinine 1.69.  He is followed in the advanced heart failure clinic and will have a follow-up appointment to see Dr. Aundra Dubin in 6 weeks.  He has obstructive sleep apnea and we discussed the importance of daily use with average sleep duration with CPAP at 7 and 9 hours if at all possible.  I reviewed his most recent echo Doppler study from yesterday which now shows normalization of LV function on guideline directed medical therapy with EF of 55 to 60% and grade 1 diastolic dysfunction.  He will also be following up with the [place] clinic.  I will see him in 6 months for reevaluation.   Medication Adjustments/Labs and Tests Ordered: Current medicines are reviewed at length with the patient today.  Concerns regarding medicines are outlined above.  Medication changes, Labs and Tests ordered today are listed in the Patient Instructions below. Patient Instructions  Medication Instructions:  Your Physician recommend you continue on your current medication as directed.    *If you need  a refill on your cardiac medications before your next appointment, please call your pharmacy*  Lab Work: None    Testing/Procedures: None   Follow-Up: At [facility], you and your health needs are our priority.  As part of our continuing mission to provide you with exceptional heart care, we have created designated Provider Care Teams.  These Care Teams include your primary Cardiologist (physician) and Advanced Practice Providers (APPs -  Physician Assistants and Nurse Practitioners) who all work  together to provide you with the care you need, when you need it.  We recommend signing up for the patient portal called "MyChart".  Sign up information is provided on this After Visit Summary.  MyChart is used to connect with patients for Virtual Visits (Telemedicine).  Patients are able to view lab/test results, encounter notes, upcoming appointments, etc.  Non-urgent messages can be sent to your provider as well.   To learn more about what you can do with MyChart, go to [email or web address].    Your next appointment:   6 month(s)  The format for your next appointment:   In Person  Provider:   Shelva Majestic, MD      Signed, Shelva Majestic, MD  05/24/2021 4:46 PM    [place] [address] [place], [place], [place]  [address] Phone: [phone]

## 2021-05-19 ENCOUNTER — Other Ambulatory Visit (HOSPITAL_COMMUNITY): Payer: Self-pay

## 2021-05-24 ENCOUNTER — Encounter: Payer: Self-pay | Admitting: Cardiovascular Disease

## 2021-05-24 ENCOUNTER — Other Ambulatory Visit: Payer: Self-pay

## 2021-05-24 ENCOUNTER — Ambulatory Visit (HOSPITAL_COMMUNITY)
Admission: RE | Admit: 2021-05-24 | Discharge: 2021-05-24 | Disposition: A | Discharge status: Home / Self Care | Payer: No Typology Code available for payment source | Source: Ambulatory Visit | Attending: Cardiology | Admitting: Cardiology

## 2021-05-24 DIAGNOSIS — I5042 Chronic combined systolic (congestive) and diastolic (congestive) heart failure: Secondary | ICD-10-CM | POA: Insufficient documentation

## 2021-05-24 LAB — BASIC METABOLIC PANEL
Anion gap: 7 (ref 5–15)
BUN: 19 mg/dL (ref 8–23)
CO2: 26 mmol/L (ref 22–32)
Calcium: 10 mg/dL (ref 8.9–10.3)
Chloride: 105 mmol/L (ref 98–111)
Creatinine, Ser: 1.66 mg/dL — ABNORMAL HIGH (ref 0.61–1.24)
GFR, Estimated: 46 mL/min — ABNORMAL LOW (ref >60)
Glucose, Bld: 71 mg/dL (ref 70–99)
Potassium: 4 mmol/L (ref 3.5–5.1)
Sodium: 138 mmol/L (ref 135–145)

## 2021-05-30 ENCOUNTER — Other Ambulatory Visit (HOSPITAL_COMMUNITY): Payer: Self-pay | Admitting: *Deleted

## 2021-05-30 ENCOUNTER — Telehealth (HOSPITAL_COMMUNITY): Payer: Self-pay

## 2021-05-30 DIAGNOSIS — I5042 Chronic combined systolic (congestive) and diastolic (congestive) heart failure: Secondary | ICD-10-CM

## 2021-05-30 NOTE — Telephone Encounter (Signed)
CPT [id number] has been approved.  Case ID [id number], approval ID [id number].

## 2021-06-12 ENCOUNTER — Other Ambulatory Visit (HOSPITAL_COMMUNITY): Payer: Self-pay | Admitting: Cardiology

## 2021-06-12 ENCOUNTER — Other Ambulatory Visit (HOSPITAL_COMMUNITY): Payer: Self-pay

## 2021-06-13 ENCOUNTER — Other Ambulatory Visit (HOSPITAL_COMMUNITY): Payer: Self-pay

## 2021-06-13 MED ORDER — FUROSEMIDE 20 MG PO TABS
20.0000 mg | ORAL_TABLET | Freq: Every day | ORAL | 2 refills | Status: DC
  Filled 2021-06-13: qty 30, 30d supply, fill #0
  Filled 2021-07-12: qty 30, 30d supply, fill #1
  Filled 2021-08-03: qty 30, 30d supply, fill #2

## 2021-06-22 NOTE — Progress Notes (Signed)
ADVANCED HEART FAILURE CLINIC NOTE  PCP: [facility] Primary Cardiologist: [facility] Dr. Marvel Plan, [facility] Shelva Majestic HF Cardiologist: Dr. Aundra Dubin  HPI: Charles Krueger is a 65 y.o.  male  with a PMH significant for pulmonary sarcoidosis, type 2 diabetes, non-oxygen dependent COPD, OSA, ETOH abuse, history of pancreatitis with pseudocyst and abscess, gout, adrenal adenoma, hypertension, hyperlipidemia, CAD, CKD stage III, cirrhosis, nephrotic syndrome.   LHC in 2010 with EF 45% with global hypokinesis,  RCA 60 to 70% stenosis but nothing amendable to PCI.   He started developing shortness of breath and was admitted initially at [facility] in April 2022.  He had an ECHO with EF 50-55%, G2DD, RVSP estimation >60.  He left before being discharged due to a disagreement with the inpatient team.  Only diuretic he had after this was thiazide.    He presented to [facility] on 12/08/2020 for progressively worsening shortness of breath with exertion, lower extremity edema to thighs, abdominal bloating, activity intolerance, orthopnea, PND.  He reported alcohol use disorder history with history of heavy drinking for >30 years until 2010 but still drinking 1-2 shots of liquor 4-5x per week.  CXR revealed mild to moderate pulmonary edema.  BNP was 1213.  Elevated creatinine 1.72, albumin 2.6, elevated alkaline phos 348, and GFR 44.  Hs troponin negative x2. He was started on IV lasix.  Had a repeat echo 12/09/20 with EF 30 to 35%, no RWMA,  RV systolic function mildly reduced, RV size moderately enlarged, mild to moderate TR, mild AS.  LHC/RHC in 6/22 showed elevated filling pressures, preserved cardiac output, mixed pulmonary venous/pulmonary arterial hypertension, and occluded distal RCA with collaterals.   Reports he is monitored by pulmonology at [facility] for his sarcoidosis with yearly PFT's and chest imaging.  They tell him there has been no significant changes.    Cannot rule out cardiac involvement but he was unable to complete  cMRI due claustrophobia (PTSD from [employer]).  He was seen in [facility] clinic 12/23/20 & 12/30/20. Losing weight, titrating GDMT.  Denies dyspnea, orthopnea or PND.    Had repeat echo 11/22, EF up to 55-60%, basal inferior akinesis, normal RV.   At last clinic visit 11/22, he was noted to be fluid overloaded and hypertensive. Lasix was added and hydralazine was increased.   He returns back to clinic today. Doing well. Wt down 4 lb since last visit. Able to complete ADLs w/o much limitations/ dyspnea. No LEE, orthopnea or PND. Denies CP. Reports full med compliance. BP well controlled. Only using CPAP 3-4 nights/week on average.   ECG: not performed today    Labs (9/22): K 4.6, creatinine 1.61 Labs (11/22) K 4.0, Scr 1.66, LDL 38 mg/dL   PMH: 1. OSA: Uses CPAP irregularly 2. Sarcoidosis: Pulmonary.  Followed at the [place].  I am not sure of the details about diagnosis, he says that he did not have a biopsy.  3. COPD: Smokes rare cigars now. 4. Cirrhosis: Likely due to ETOH abuse.  5. H/o pancreatitis with pseudocyst formation and abscess.  6. Gout 7. Adrenal adenoma 8. HTN 9. Hyperlipidemia 10. CKD stage 3: With nephrotic syndrome.  11. Hyperparathyroidism 12. Type 2 diabetes 13. CAD: LHC in 6/22 with 70% mRCA, occluded dRCA with collaterals,  50% D3, 60% ramus, 50% OM1, 45% pLAD.  14. Chronic systolic CHF: ?Ischemic cardiomyopathy. Echo (6/22) with EF 30-35%.  Echo (11/22) with EF 55-60%, basal inferior akinesis, normal RV. - RHC (6/22): mean RA 15, PA 73/23 mean 43, mean  PCWP 23, LVEDP 21, CI 2.75, PVR 3.45 WU.   SH:  Social History   Socioeconomic History   Marital status: Married    Spouse name: Not on file   Number of children: Not on file   Years of education: Not on file   Highest education level: Not on file  Occupational History   Not on file  Tobacco Use   Smoking status: Some Days    Types: Cigars    Start date: 2002   Smokeless tobacco: Current   Tobacco comments:     3-4 Cigars per week  Substance and Sexual Activity   Alcohol use: Yes    Alcohol/week: 6.0 - 7.0 standard drinks    Types: 6 - 7 Shots of liquor per week    Comment: 6-7 shots per week   Drug use: No   Sexual activity: Not on file  Other Topics Concern   Not on file  Social History Narrative   Not on file   Social Determinants of Health   Financial Resource Strain: Not on file  Food Insecurity: Not on file  Transportation Needs: Not on file  Physical Activity: Not on file  Stress: Not on file  Social Connections: Not on file  Intimate Partner Violence: Not on file   FH: No sarcoidosis, no premature CAD.    Current Outpatient Medications  Medication Sig Dispense Refill   albuterol (VENTOLIN HFA) 108 (90 Base) MCG/ACT inhaler Inhale 2 puffs into the lungs every 6 (six) hours as needed for wheezing or shortness of breath.     allopurinol (ZYLOPRIM) 100 MG tablet Take 100 mg by mouth daily.     aspirin 81 MG tablet Take 162 mg by mouth at bedtime.     atorvastatin (LIPITOR) 40 MG tablet Take 1 tablet (40 mg total) by mouth daily. 30 tablet 1   carvedilol (COREG) 25 MG tablet Take 25 mg by mouth 2 (two) times daily with a meal.     empagliflozin (JARDIANCE) 10 MG TABS tablet Take 1 tablet (10 mg total) by mouth daily. 90 tablet 3   fluticasone (FLONASE) 50 MCG/ACT nasal spray Place 2 sprays into both nostrils daily.     furosemide (LASIX) 20 MG tablet Take 1 tablet (20 mg total) by mouth daily. 30 tablet 2   hydrALAZINE (APRESOLINE) 50 MG tablet Take 1.5 tablets (75 mg total) by mouth 3 (three) times daily. 270 tablet 3   ipratropium (ATROVENT) 0.03 % nasal spray Place 2 sprays into both nostrils daily.     isosorbide mononitrate (IMDUR) 30 MG 24 hr tablet Take 1 tablet (30 mg total) by mouth daily. 90 tablet 3   ketotifen (ZADITOR) 0.025 % ophthalmic solution Place 1 drop into both eyes every morning.     metFORMIN (GLUCOPHAGE) 1000 MG tablet Take 1,000 mg by mouth 2 (two) times  daily with a meal.     Multiple Vitamin (MULTIVITAMIN WITH MINERALS) TABS tablet Take 1 tablet by mouth daily.     Omega 3 1200 MG CAPS Take 1,200 mg by mouth daily.     OXYGEN Inhale 2 L into the lungs as needed.     potassium chloride SA (KLOR-CON) 20 MEQ tablet Take 1 tablet (20 mEq total) by mouth daily for 3 days then as needed as directed 30 tablet 0   sacubitril-valsartan (ENTRESTO) 97-103 MG Take 1 tablet by mouth 2 times daily. 180 tablet 3   spironolactone (ALDACTONE) 25 MG tablet Take 1 tablet (25 mg total)  by mouth daily. 90 tablet 3   Tiotropium Bromide-Olodaterol (STIOLTO RESPIMAT) 2.5-2.5 MCG/ACT AERS Inhale 2 puffs into the lungs daily.     No current facility-administered medications for this encounter.   BP 136/84    Pulse 67    Ht 5\' 8"  (1.727 m)    Wt 81.4 kg (179 lb 6.4 oz)    SpO2 96%    BMI 27.28 kg/m   Wt Readings from Last 3 Encounters:  06/23/21 81.4 kg (179 lb 6.4 oz)  05/17/21 81.6 kg (180 lb)  05/12/21 82.6 kg (182 lb)   PHYSICAL EXAM: General:  Well appearing. No respiratory difficulty HEENT: normal Neck: supple. no JVD. Carotids 2+ bilat; no bruits. No lymphadenopathy or thyromegaly appreciated. Cor: PMI nondisplaced. Regular rate & rhythm. No rubs, gallops or murmurs. Lungs: clear Abdomen: soft, nontender, nondistended. No hepatosplenomegaly. No bruits or masses. Good bowel sounds. Extremities: no cyanosis, clubbing, rash, edema Neuro: alert & oriented x 3, cranial nerves grossly intact. moves all 4 extremities w/o difficulty. Affect pleasant.   ASSESSMENT & PLAN: 1. Chronic systolic => diastolic CHF:  Ischemic versus ETOH versus ?sarcoidosis.  Echo (6/22) with EF 30-35%, RV with reduced contractility of the mid RV and base.  R/LHC (6/22) with CAD and elevated filling pressures, mixed pulmonary venous/arterial hypertension.  Echo 11/22 EF up to 55-60% with normal RV.  NYHA class II symptoms.  Euvolemic on exam.  - He was unable to complete cMRI due  claustrophobia (PTSD from [employer]). - Continue Lasix 20 mg daily.   - Continue Entresto 97/103 mg bid. - Continue carvedilol 25 mg bid.  - Continue hydralazine 75 mg tid  - Continue Imdur 30 daily.  - Continue Jardiance 10 mg daily.  - Continue Spiro 25 mg daily. - BMP today  2. Pulmonary hypertension: Mixed pulmonary venous/pulmonary arterial hypertension on 6/22 RHC.  Suspect group 2 + group 3 (COPD, pulmonary sarcoidosis) PH.   - Needs to use CPAP regularly. Encouraged he improve compliance  - Arrange for V/Q scan to r/o CTEPH  3. CAD: Occluded distal RCA with moderate diffuse multivessel disease on cath (6/22). Denies anginal symptomatology - Continue ASA 81 mg  - Continue atorvastatin 40 daily. LDL controlled, 38 mg/dL on recent LP 11/22 4. CKD stage III: Baseline SCr ~1.6. - BMET today. 5. Cirrhosis: Noted on abdominal US 12/08/20.  Suspect related to ETOH.  - Needs complete alcohol cessation. Working on cutting back. 6. COPD: On daily LABA/LAMA therapy. - Continue pulmonology follow up. 7. OSA: Wears CPAP about 3-4x/week. - Encouraged regular compliance - followed by Dr. Claiborne Billings  8. Pulmonary sarcoidosis: I am unsure how this diagnosis was made, he denies prior biopsy.  He is followed by pulmonary at the [place].  Cannot rule out cardiac involvement but he has not been able to tolerate cardiac MRI.  With improved EF and no arrhythmias, I do not feel strongly about send him for a cardiac PET.   F/u w/ Dr. Aundra Dubin in 3-4 months    Lyda Jester, PA-C  06/23/2021

## 2021-06-23 ENCOUNTER — Encounter (HOSPITAL_COMMUNITY): Payer: Self-pay

## 2021-06-23 ENCOUNTER — Other Ambulatory Visit (HOSPITAL_COMMUNITY): Payer: Self-pay

## 2021-06-23 ENCOUNTER — Ambulatory Visit (HOSPITAL_COMMUNITY)
Admission: RE | Admit: 2021-06-23 | Discharge: 2021-06-23 | Disposition: A | Discharge status: Home / Self Care | Payer: No Typology Code available for payment source | Source: Ambulatory Visit | Attending: Cardiology | Admitting: Cardiology

## 2021-06-23 ENCOUNTER — Other Ambulatory Visit: Payer: Self-pay

## 2021-06-23 VITALS — BP 136/84 | HR 67 | Ht 68.0 in | Wt 179.4 lb

## 2021-06-23 DIAGNOSIS — K7460 Unspecified cirrhosis of liver: Secondary | ICD-10-CM | POA: Insufficient documentation

## 2021-06-23 DIAGNOSIS — E1122 Type 2 diabetes mellitus with diabetic chronic kidney disease: Secondary | ICD-10-CM | POA: Insufficient documentation

## 2021-06-23 DIAGNOSIS — I272 Pulmonary hypertension, unspecified: Secondary | ICD-10-CM

## 2021-06-23 DIAGNOSIS — Z955 Presence of coronary angioplasty implant and graft: Secondary | ICD-10-CM | POA: Diagnosis not present

## 2021-06-23 DIAGNOSIS — I2721 Secondary pulmonary arterial hypertension: Secondary | ICD-10-CM | POA: Diagnosis not present

## 2021-06-23 DIAGNOSIS — F4310 Post-traumatic stress disorder, unspecified: Secondary | ICD-10-CM | POA: Diagnosis not present

## 2021-06-23 DIAGNOSIS — E785 Hyperlipidemia, unspecified: Secondary | ICD-10-CM | POA: Diagnosis not present

## 2021-06-23 DIAGNOSIS — J449 Chronic obstructive pulmonary disease, unspecified: Secondary | ICD-10-CM | POA: Insufficient documentation

## 2021-06-23 DIAGNOSIS — F40240 Claustrophobia: Secondary | ICD-10-CM | POA: Diagnosis not present

## 2021-06-23 DIAGNOSIS — G4733 Obstructive sleep apnea (adult) (pediatric): Secondary | ICD-10-CM | POA: Insufficient documentation

## 2021-06-23 DIAGNOSIS — I130 Hypertensive heart and chronic kidney disease with heart failure and stage 1 through stage 4 chronic kidney disease, or unspecified chronic kidney disease: Secondary | ICD-10-CM | POA: Diagnosis not present

## 2021-06-23 DIAGNOSIS — I2510 Atherosclerotic heart disease of native coronary artery without angina pectoris: Secondary | ICD-10-CM | POA: Diagnosis not present

## 2021-06-23 DIAGNOSIS — Z86018 Personal history of other benign neoplasm: Secondary | ICD-10-CM | POA: Diagnosis not present

## 2021-06-23 DIAGNOSIS — Z7982 Long term (current) use of aspirin: Secondary | ICD-10-CM | POA: Insufficient documentation

## 2021-06-23 DIAGNOSIS — D860 Sarcoidosis of lung: Secondary | ICD-10-CM | POA: Diagnosis not present

## 2021-06-23 DIAGNOSIS — I5042 Chronic combined systolic (congestive) and diastolic (congestive) heart failure: Secondary | ICD-10-CM | POA: Diagnosis not present

## 2021-06-23 DIAGNOSIS — N183 Chronic kidney disease, stage 3 unspecified: Secondary | ICD-10-CM | POA: Insufficient documentation

## 2021-06-23 DIAGNOSIS — Z79899 Other long term (current) drug therapy: Secondary | ICD-10-CM | POA: Diagnosis not present

## 2021-06-23 DIAGNOSIS — Z8719 Personal history of other diseases of the digestive system: Secondary | ICD-10-CM | POA: Diagnosis not present

## 2021-06-23 DIAGNOSIS — F17290 Nicotine dependence, other tobacco product, uncomplicated: Secondary | ICD-10-CM | POA: Diagnosis not present

## 2021-06-23 MED ORDER — METFORMIN HCL 1000 MG PO TABS
1000.0000 mg | ORAL_TABLET | Freq: Two times a day (BID) | ORAL | 0 refills | Status: DC
  Filled 2021-06-23: qty 60, 30d supply, fill #0

## 2021-06-23 NOTE — Patient Instructions (Signed)
Thank you for coming in today  Labs were done today, if any labs are abnormal the clinic will call you  You will be scheduled for a VQ scan   Your physician recommends that you schedule a follow-up appointment in: Dr. Shirlee Latch 3-4 months   At the [facility], you and your health needs are our priority. As part of our continuing mission to provide you with exceptional heart care, we have created designated Provider Care Teams. These Care Teams include your primary Cardiologist (physician) and Advanced Practice Providers (APPs- Physician Assistants and Nurse Practitioners) who all work together to provide you with the care you need, when you need it.   You may see any of the following providers on your designated Care Team at your next follow up: Dr Arvilla Meres Dr Carron Curie, NP Robbie Lis, [place] [facility], [place] Karle Plumber, PharmD   Please be sure to bring in all your medications bottles to every appointment.   If you have any questions or concerns before your next appointment please send [place] a message through [place] or call our office at [phone].    TO LEAVE A MESSAGE FOR THE NURSE SELECT OPTION 2, PLEASE LEAVE A MESSAGE INCLUDING: YOUR NAME DATE OF BIRTH CALL BACK NUMBER REASON FOR CALL**this is important as we prioritize the call backs  YOU WILL RECEIVE A CALL BACK THE SAME DAY AS LONG AS YOU CALL BEFORE 4:00 PM

## 2021-07-07 ENCOUNTER — Other Ambulatory Visit: Payer: Self-pay | Admitting: Student

## 2021-07-07 ENCOUNTER — Other Ambulatory Visit (HOSPITAL_COMMUNITY): Payer: Self-pay | Admitting: Cardiology

## 2021-07-07 ENCOUNTER — Other Ambulatory Visit (HOSPITAL_COMMUNITY): Payer: Self-pay

## 2021-07-12 ENCOUNTER — Other Ambulatory Visit (HOSPITAL_COMMUNITY): Payer: Self-pay

## 2021-07-14 ENCOUNTER — Telehealth (HOSPITAL_COMMUNITY): Payer: Self-pay | Admitting: Vascular Surgery

## 2021-07-14 NOTE — Telephone Encounter (Signed)
Left pt VM giving VQ scan @ [facility] @ 9am 07/26/21, asked pt to call back to confirm

## 2021-07-26 ENCOUNTER — Ambulatory Visit (HOSPITAL_COMMUNITY)
Admission: RE | Admit: 2021-07-26 | Discharge: 2021-07-26 | Disposition: A | Discharge status: Home / Self Care | Payer: No Typology Code available for payment source | Source: Ambulatory Visit | Attending: Cardiology | Admitting: Cardiology

## 2021-07-26 ENCOUNTER — Other Ambulatory Visit: Payer: Self-pay

## 2021-07-26 ENCOUNTER — Encounter (HOSPITAL_COMMUNITY)
Admission: RE | Admit: 2021-07-26 | Discharge: 2021-07-26 | Disposition: A | Discharge status: Home / Self Care | Payer: No Typology Code available for payment source | Source: Ambulatory Visit | Attending: Cardiology | Admitting: Cardiology

## 2021-07-26 DIAGNOSIS — I5042 Chronic combined systolic (congestive) and diastolic (congestive) heart failure: Secondary | ICD-10-CM | POA: Insufficient documentation

## 2021-07-26 MED ORDER — TECHNETIUM TO 99M ALBUMIN AGGREGATED
4.3200 | Freq: Once | INTRAVENOUS | Status: AC | PRN
  Administered 2021-07-26: 4.32 via INTRAVENOUS

## 2021-08-04 ENCOUNTER — Other Ambulatory Visit (HOSPITAL_COMMUNITY): Payer: Self-pay

## 2021-08-07 ENCOUNTER — Other Ambulatory Visit (HOSPITAL_COMMUNITY): Payer: Self-pay

## 2021-09-18 ENCOUNTER — Encounter (HOSPITAL_COMMUNITY): Payer: No Typology Code available for payment source | Admitting: Cardiology

## 2021-11-12 ENCOUNTER — Emergency Department (HOSPITAL_COMMUNITY)
Admission: EM | Admit: 2021-11-12 | Discharge: 2021-11-12 | Disposition: A | Discharge status: Home / Self Care | Payer: No Typology Code available for payment source | Attending: Emergency Medicine | Admitting: Emergency Medicine

## 2021-11-12 ENCOUNTER — Ambulatory Visit (HOSPITAL_COMMUNITY)
Admission: EM | Admit: 2021-11-12 | Discharge: 2021-11-12 | Disposition: A | Discharge status: Home / Self Care | Payer: No Typology Code available for payment source

## 2021-11-12 ENCOUNTER — Other Ambulatory Visit: Payer: Self-pay

## 2021-11-12 ENCOUNTER — Emergency Department (HOSPITAL_COMMUNITY): Payer: No Typology Code available for payment source

## 2021-11-12 ENCOUNTER — Encounter (HOSPITAL_COMMUNITY): Payer: Self-pay | Admitting: Emergency Medicine

## 2021-11-12 DIAGNOSIS — Z7982 Long term (current) use of aspirin: Secondary | ICD-10-CM | POA: Insufficient documentation

## 2021-11-12 DIAGNOSIS — R197 Diarrhea, unspecified: Secondary | ICD-10-CM | POA: Diagnosis not present

## 2021-11-12 DIAGNOSIS — K5900 Constipation, unspecified: Secondary | ICD-10-CM | POA: Diagnosis not present

## 2021-11-12 DIAGNOSIS — R198 Other specified symptoms and signs involving the digestive system and abdomen: Secondary | ICD-10-CM | POA: Diagnosis not present

## 2021-11-12 DIAGNOSIS — Z8719 Personal history of other diseases of the digestive system: Secondary | ICD-10-CM | POA: Diagnosis not present

## 2021-11-12 DIAGNOSIS — R1084 Generalized abdominal pain: Secondary | ICD-10-CM | POA: Diagnosis not present

## 2021-11-12 DIAGNOSIS — R195 Other fecal abnormalities: Secondary | ICD-10-CM

## 2021-11-12 LAB — CBC
HCT: 43.2 % (ref 39.0–52.0)
Hemoglobin: 15.1 g/dL (ref 13.0–17.0)
MCH: 37.2 pg — ABNORMAL HIGH (ref 26.0–34.0)
MCHC: 35 g/dL (ref 30.0–36.0)
MCV: 106.4 fL — ABNORMAL HIGH (ref 80.0–100.0)
Platelets: 185 10*3/uL (ref 150–400)
RBC: 4.06 MIL/uL — ABNORMAL LOW (ref 4.22–5.81)
RDW: 11.9 % (ref 11.5–15.5)
WBC: 7.9 10*3/uL (ref 4.0–10.5)
nRBC: 0 % (ref 0.0–0.2)

## 2021-11-12 LAB — COMPREHENSIVE METABOLIC PANEL
ALT: 16 U/L (ref 0–44)
AST: 18 U/L (ref 15–41)
Albumin: 3 g/dL — ABNORMAL LOW (ref 3.5–5.0)
Alkaline Phosphatase: 131 U/L — ABNORMAL HIGH (ref 38–126)
Anion gap: 6 (ref 5–15)
BUN: 20 mg/dL (ref 8–23)
CO2: 30 mmol/L (ref 22–32)
Calcium: 10 mg/dL (ref 8.9–10.3)
Chloride: 104 mmol/L (ref 98–111)
Creatinine, Ser: 2.03 mg/dL — ABNORMAL HIGH (ref 0.61–1.24)
GFR, Estimated: 36 mL/min — ABNORMAL LOW (ref >60)
Glucose, Bld: 120 mg/dL — ABNORMAL HIGH (ref 70–99)
Potassium: 3.9 mmol/L (ref 3.5–5.1)
Sodium: 140 mmol/L (ref 135–145)
Total Bilirubin: 0.7 mg/dL (ref 0.3–1.2)
Total Protein: 6.4 g/dL — ABNORMAL LOW (ref 6.5–8.1)

## 2021-11-12 LAB — LIPASE, BLOOD: Lipase: 219 U/L — ABNORMAL HIGH (ref 11–51)

## 2021-11-12 MED ORDER — ONDANSETRON HCL 4 MG/2ML IJ SOLN: 4.0000 mg | Freq: Once | INTRAMUSCULAR | Status: DC

## 2021-11-12 MED ORDER — MORPHINE SULFATE (PF) 4 MG/ML IV SOLN: 4.0000 mg | Freq: Once | INTRAVENOUS | Status: DC

## 2021-11-12 MED ORDER — SODIUM CHLORIDE 0.9 % IV BOLUS: 500.0000 mL | Freq: Once | INTRAVENOUS | Status: DC

## 2021-11-12 NOTE — ED Provider Notes (Signed)
[facility] Provider Note   CSN: [id number] Arrival date & time: 11/12/21  1120     History  Chief Complaint  Patient presents with   Abdominal Pain    Charles Krueger is a [age] y.o. male.  66 year old male with prior medical history of pancreatitis and cirrhosis presents with complaint of abdominal pain.  Patient reports worsening 2 weeks history of abdominal discomfort.  This is associated with some intermittent loose stools and constipation.  Patient denies any recent alcohol use.  He has been seen in the outpatient setting by gastroenterology.    He denies associated fever, hematemesis, bloody stools, chest pain, shortness of breath, nausea, vomiting.  Patient with known history of pancreatic duct stricture.  Patient reports that this is being observed.  There is no plan for intervention from GI.  He is well-established with GI both at [facility] and also within the [facility].  The history is provided by the patient and medical records.  Abdominal Pain Pain location:  Generalized Pain quality: aching and pressure   Pain radiates to:  Back Pain severity:  Moderate Onset quality:  Gradual Duration:  2 weeks Timing:  Constant Progression:  Worsening     Home Medications Prior to Admission medications   Medication Sig Start Date End Date Taking? Authorizing Provider  albuterol (VENTOLIN HFA) 108 (90 Base) MCG/ACT inhaler Inhale 2 puffs into the lungs every 6 (six) hours as needed for wheezing or shortness of breath.    [provider]  allopurinol (ZYLOPRIM) 100 MG tablet Take 100 mg by mouth daily.    [provider]  aspirin 81 MG tablet Take 162 mg by mouth at bedtime.    [provider]  atorvastatin (LIPITOR) 40 MG tablet Take 1 tablet (40 mg total) by mouth daily. 01/09/21   [name], [name], NP  carvedilol (COREG) 25 MG tablet Take 25 mg by mouth 2 (two) times daily with a meal.    [provider]  empagliflozin (JARDIANCE) 10 MG TABS tablet Take 1 tablet (10 mg total) by mouth daily. 12/23/20   [name], [name], MD  fluticasone (FLONASE) 50 MCG/ACT nasal spray Place 2 sprays into both nostrils daily.    [provider]  furosemide (LASIX) 20 MG tablet Take 1 tablet (20 mg total) by mouth daily. 06/13/21   [name], [name], MD  hydrALAZINE (APRESOLINE) 50 MG tablet Take 1.5 tablets (75 mg total) by mouth 3 (three) times daily. 05/12/21   [name], [name], MD  ipratropium (ATROVENT) 0.03 % nasal spray Place 2 sprays into both nostrils daily.    [provider]  isosorbide mononitrate (IMDUR) 30 MG 24 hr tablet Take 1 tablet (30 mg total) by mouth daily. 03/14/21   [name], [name], [name]  ketotifen (ZADITOR) 0.025 % ophthalmic solution Place 1 drop into both eyes every morning.    [provider]  metFORMIN (GLUCOPHAGE) 1000 MG tablet Take 1 tablet (1,000 mg total) by mouth 2 (two) times daily with a meal. 06/23/21   [name], [name], PA-C  Multiple Vitamin (MULTIVITAMIN WITH MINERALS) TABS tablet Take 1 tablet by mouth daily.    [provider]  Omega 3 1200 MG CAPS Take 1,200 mg by mouth daily.    [provider]  OXYGEN Inhale 2 L into the lungs as needed.    [provider]  potassium chloride SA (KLOR-CON) 20 MEQ tablet Take 1 tablet (20 mEq total) by mouth daily  for 3 days then as needed as directed 03/14/21   [name], [name]  sacubitril-valsartan (ENTRESTO) 97-103 MG Take 1 tablet by mouth 2 times daily. 01/19/21   [name], [name]  spironolactone (ALDACTONE) 25 MG tablet Take 1 tablet (25 mg total) by mouth daily. 02/13/21   [name], MD  Tiotropium Bromide-Olodaterol (STIOLTO RESPIMAT) 2.5-2.5 MCG/ACT AERS Inhale 2 puffs into the lungs daily.    [provider]      Allergies    Lisinopril    Review of Systems   Review of Systems  Gastrointestinal:  Positive for abdominal pain.   All other systems reviewed and are negative.  Physical Exam Updated Vital Signs BP (!) 145/88 (BP Location: Right Arm)   Pulse 76   Temp 98 F (36.7 C) (Oral)   Resp 18   SpO2 96%  Physical Exam Vitals and nursing note reviewed.  Constitutional:      General: He is not in acute distress.    Appearance: Normal appearance. He is well-developed.  HENT:     Head: Normocephalic and atraumatic.  Eyes:     Conjunctiva/sclera: Conjunctivae normal.     Pupils: Pupils are equal, round, and reactive to light.  Cardiovascular:     Rate and Rhythm: Normal rate and regular rhythm.     Heart sounds: Normal heart sounds.  Pulmonary:     Effort: Pulmonary effort is normal. No respiratory distress.     Breath sounds: Normal breath sounds.  Abdominal:     General: There is no distension.     Palpations: Abdomen is soft.     Tenderness: There is no abdominal tenderness.  Musculoskeletal:        General: No deformity. Normal range of motion.     Cervical back: Normal range of motion and neck supple.  Skin:    General: Skin is warm and dry.  Neurological:     General: No focal deficit present.     Mental Status: He is alert and oriented to person, place, and time.    ED Results / Procedures / Treatments   Labs (all labs ordered are listed, but only abnormal results are displayed) Labs Reviewed  LIPASE, BLOOD - Abnormal; Notable for the following components:      Result Value   Lipase 219 (*)    All other components within normal limits  COMPREHENSIVE METABOLIC PANEL - Abnormal; Notable for the following components:   Glucose, Bld 120 (*)    Creatinine, Ser 2.03 (*)    Total Protein 6.4 (*)    Albumin 3.0 (*)    Alkaline Phosphatase 131 (*)    GFR, Estimated 36 (*)    All other components within normal limits  CBC - Abnormal; Notable for the following components:   RBC 4.06 (*)    MCV 106.4 (*)    MCH 37.2 (*)    All other components within normal limits  URINALYSIS, ROUTINE W  REFLEX MICROSCOPIC    EKG None  Radiology No results found.  Procedures Procedures    Medications Ordered in ED Medications  sodium chloride 0.9 % bolus 500 mL (has no administration in time range)  morphine (PF) 4 MG/ML injection 4 mg (has no administration in time range)  ondansetron (ZOFRAN) injection 4 mg (has no administration in time range)    ED Course/ Medical Decision Making/ A&P  Medical Decision Making Amount and/or Complexity of Data Reviewed Labs: ordered. Radiology: ordered.  Risk Prescription drug management.    Medical Screen Complete  This patient presented to the ED with complaint of abdominal pain, constipation.  This complaint involves an extensive number of treatment options. The initial differential diagnosis includes, but is not limited to, intra-abdominal pathology, pancreatitis, etc.  This presentation is: Acute, Chronic, Self-Limited, Previously Undiagnosed, Uncertain Prognosis, Complicated, Systemic Symptoms, and Threat to Life/Bodily Function  Patient is presenting with complaint of abdominal discomfort with intermittent constipation and loose stools.  Symptoms have been ongoing for 2 weeks.  Patient with history of pancreatitis.  Patient reports that he has not had any alcohol for the last 2 to 3 weeks.  Patient with known history of pancreatic duct stricture.  Patient reports that this is being followed.  Imaging is without significant acute pathology.  Patient's labs are on the whole unremarkable except for elevated lipase at 219 and creatinine of 2.03. Patient reports that his pain today is not consistent with prior episodes of pancreatitis.  Patient reports that his creatinine is being followed carefully by nephrology in the outpatient setting.  He reports that he has a follow-up appointment with his primary at the [place] clinic on this Friday.  Patient reports that he is feeling significant improved after his  ED evaluation.  He declines IV, IV fluids, IV pain medicines, IV antiemetics.  He declines additional work-up and/or admission.  He reports that he feels significantly improved.  He is reassured by ED evaluation and findings.  He plans on following closely in the outpatient setting both with his regular doctor, GI, and nephrology.  Patient is advised to consume plenty of fluids, avoid alcohol intake, and to avoid fatty or fried foods.    Co morbidities that complicated the patient's evaluation  History of pancreatitis, history of pancreatic duct stricture   Additional history obtained:  External records from outside sources obtained and reviewed including prior ED visits and prior Inpatient records.    Lab Tests:  I ordered and personally interpreted labs.  The pertinent results include: CBC, CMP, lipase   Imaging Studies ordered:  I ordered imaging studies including CT abdomen pelvis I independently visualized and interpreted obtained imaging which showed no acute pathology -specifically no acute pancreatitis I agree with the radiologist interpretation.   Cardiac Monitoring:  The patient was maintained on a cardiac monitor.  I personally viewed and interpreted the cardiac monitor which showed an underlying rhythm of: NSR   Problem List / ED Course:  Abdominal pain   Reevaluation:  After the interventions noted above, I reevaluated the patient and found that they have: improved  Disposition:  After consideration of the diagnostic results and the patients response to treatment, I feel that the patent would benefit from close outpatient follow-up.          Final Clinical Impression(s) / ED Diagnoses Final diagnoses:  Generalized abdominal pain    Rx / DC Orders ED Discharge Orders     None         Wynetta Fines, MD 11/12/21 1304

## 2021-11-12 NOTE — ED Triage Notes (Signed)
Woke with lower back pain.  Abdominal pain for 2 weeks, intermittently.  Reports diarrhea yesterday.  2 runny stools yesterday and dark. Has a history of pancreatitis.  Prior to yesterday, had not had regular bowel movement.  Denies vomiting

## 2021-11-12 NOTE — Discharge Instructions (Signed)
Please report to the emergency room now as you are in need of a higher level of care than we can provide in the urgent care setting. My hope is that they consider a CT scan of your abdomen and pelvis given your severe abdominal pain, abdominal guarding, dark stools. Please head there now for further evaluation and intervention.

## 2021-11-12 NOTE — ED Provider Notes (Signed)
[facility]   MRN: [id number] DOB: 11-24-1955  Subjective:   Charles Krueger is a 66 y.o. male presenting for 2-week history of persistent worsening generalized abdominal pain, intermittent loose stools alternating with constipation.  Patient started having really dark stools yesterday.  States that he felt like he was constipated or casted before but feels like his stomach is very active.  He did get Gas-X which helped very temporarily.  Has a significant GI history including a history of pancreatitis.  He is followed by gastroenterology regularly as he also has a history of cirrhosis.  Denies that he is drinking now.  Last office visit with him was 07/21/2021 to evaluate for his dilated pancreatic duct through imaging.  Denies any fever, hematemesis, chest pain, shortness of breath, nausea, vomiting.  No current facility-administered medications for this encounter.  Current Outpatient Medications:    albuterol (VENTOLIN HFA) 108 (90 Base) MCG/ACT inhaler, Inhale 2 puffs into the lungs every 6 (six) hours as needed for wheezing or shortness of breath., Disp: , Rfl:    allopurinol (ZYLOPRIM) 100 MG tablet, Take 100 mg by mouth daily., Disp: , Rfl:    aspirin 81 MG tablet, Take 162 mg by mouth at bedtime., Disp: , Rfl:    atorvastatin (LIPITOR) 40 MG tablet, Take 1 tablet (40 mg total) by mouth daily., Disp: 30 tablet, Rfl: 1   carvedilol (COREG) 25 MG tablet, Take 25 mg by mouth 2 (two) times daily with a meal., Disp: , Rfl:    empagliflozin (JARDIANCE) 10 MG TABS tablet, Take 1 tablet (10 mg total) by mouth daily., Disp: 90 tablet, Rfl: 3   fluticasone (FLONASE) 50 MCG/ACT nasal spray, Place 2 sprays into both nostrils daily., Disp: , Rfl:    furosemide (LASIX) 20 MG tablet, Take 1 tablet (20 mg total) by mouth daily., Disp: 30 tablet, Rfl: 2   hydrALAZINE (APRESOLINE) 50 MG tablet, Take 1.5 tablets (75 mg total) by mouth 3 (three) times daily., Disp: 270 tablet, Rfl: 3    ipratropium (ATROVENT) 0.03 % nasal spray, Place 2 sprays into both nostrils daily., Disp: , Rfl:    isosorbide mononitrate (IMDUR) 30 MG 24 hr tablet, Take 1 tablet (30 mg total) by mouth daily., Disp: 90 tablet, Rfl: 3   ketotifen (ZADITOR) 0.025 % ophthalmic solution, Place 1 drop into both eyes every morning., Disp: , Rfl:    metFORMIN (GLUCOPHAGE) 1000 MG tablet, Take 1 tablet (1,000 mg total) by mouth 2 (two) times daily with a meal., Disp: 60 tablet, Rfl: 0   Multiple Vitamin (MULTIVITAMIN WITH MINERALS) TABS tablet, Take 1 tablet by mouth daily., Disp: , Rfl:    Omega 3 1200 MG CAPS, Take 1,200 mg by mouth daily., Disp: , Rfl:    OXYGEN, Inhale 2 L into the lungs as needed., Disp: , Rfl:    potassium chloride SA (KLOR-CON) 20 MEQ tablet, Take 1 tablet (20 mEq total) by mouth daily for 3 days then as needed as directed, Disp: 30 tablet, Rfl: 0   sacubitril-valsartan (ENTRESTO) 97-103 MG, Take 1 tablet by mouth 2 times daily., Disp: 180 tablet, Rfl: 3   spironolactone (ALDACTONE) 25 MG tablet, Take 1 tablet (25 mg total) by mouth daily., Disp: 90 tablet, Rfl: 3   Tiotropium Bromide-Olodaterol (STIOLTO RESPIMAT) 2.5-2.5 MCG/ACT AERS, Inhale 2 puffs into the lungs daily., Disp: , Rfl:    Allergies  Allergen Reactions   Lisinopril Other (See Comments)    Acute renal failure  Past Medical History:  Diagnosis Date   Diabetes mellitus without complication (HCC)    HTN (hypertension)    OSA on CPAP      Past Surgical History:  Procedure Laterality Date   RIGHT/LEFT HEART CATH AND CORONARY ANGIOGRAPHY N/A 12/14/2020   Procedure: RIGHT/LEFT HEART CATH AND CORONARY ANGIOGRAPHY;  Surgeon: Marykay Lex, MD;  Location: [facility] INVASIVE CV LAB;  Service: Cardiovascular;  Laterality: N/A;    Family History  Problem Relation Age of Onset   CAD Mother     Social History   Tobacco Use   Smoking status: Some Days    Types: Cigars    Start date: 2002   Smokeless tobacco: Current    Tobacco comments:    3-4 Cigars per week  Vaping Use   Vaping Use: Never used  Substance Use Topics   Alcohol use: Yes    Alcohol/week: 6.0 - 7.0 standard drinks    Types: 6 - 7 Shots of liquor per week    Comment: 6-7 shots per week   Drug use: No    ROS   Objective:   Vitals: BP 138/83 (BP Location: Right Arm)   Pulse 75   Temp 98.6 F (37 C) (Oral)   Resp 20   SpO2 95%   Physical Exam Constitutional:      General: He is not in acute distress.    Appearance: Normal appearance. He is well-developed and normal weight. He is not ill-appearing, toxic-appearing or diaphoretic.  HENT:     Head: Normocephalic and atraumatic.     Right Ear: External ear normal.     Left Ear: External ear normal.     Nose: Nose normal.     Mouth/Throat:     Mouth: Mucous membranes are moist.     Pharynx: Oropharynx is clear.  Eyes:     General: No scleral icterus.       Right eye: No discharge.        Left eye: No discharge.     Extraocular Movements: Extraocular movements intact.  Cardiovascular:     Rate and Rhythm: Normal rate and regular rhythm.     Heart sounds: Normal heart sounds. No murmur heard.   No friction rub. No gallop.  Pulmonary:     Effort: Pulmonary effort is normal. No respiratory distress.     Breath sounds: Normal breath sounds. No stridor. No wheezing, rhonchi or rales.  Abdominal:     General: Bowel sounds are increased. There is no distension.     Palpations: Abdomen is soft. There is no mass.     Tenderness: There is generalized abdominal tenderness and tenderness in the right upper quadrant, right lower quadrant, periumbilical area and suprapubic area. There is guarding. There is no right CVA tenderness, left CVA tenderness or rebound.  Musculoskeletal:     Cervical back: Normal range of motion.  Neurological:     Mental Status: He is alert and oriented to person, place, and time.  Psychiatric:        Mood and Affect: Mood normal.        Behavior: Behavior  normal.        Thought Content: Thought content normal.        Judgment: Judgment normal.    Assessment and Plan :   PDMP not reviewed this encounter.  1. Generalized abdominal pain   2. Abdominal guarding   3. Dark stools   4. History of pancreatitis    Patient has significant  guarding on exam and a complex GI history.  Recommended further evaluation and intervention than we can provide in the urgent setting including an ER visit for consideration of lab work, CT imaging.  Maintain follow-up as instructed otherwise with his GI specialist. Counseled patient on potential for adverse effects with medications prescribed/recommended today, ER and return-to-clinic precautions discussed, patient verbalized understanding.    Wallis Bamberg, PA-C 11/12/21 1121

## 2021-11-12 NOTE — ED Triage Notes (Signed)
C/o lower abd pain that radiates to back x 2 weeks.  Reports "milky bowel movement" yesterday.  Denies dysuria but reports decreased urge to urinate. Denies nausea and vomiting.

## 2021-11-12 NOTE — ED Notes (Signed)
Patient is being discharged from the Urgent Care and sent to the Emergency Department via POV . Per [place], [place], patient is in need of higher level of care due to need for higher level of care and diagnostic tools. Patient is aware and verbalizes understanding of plan of care.  Vitals:   11/12/21 1100  BP: 138/83  Pulse: 75  Resp: 20  Temp: 98.6 F (37 C)  SpO2: 95%

## 2021-11-12 NOTE — Discharge Instructions (Signed)
Return for any problem.  ?

## 2022-04-18 ENCOUNTER — Observation Stay (HOSPITAL_COMMUNITY)
Admission: EM | Admit: 2022-04-18 | Discharge: 2022-04-20 | Disposition: A | Discharge status: Home / Self Care | Payer: No Typology Code available for payment source | Attending: Family Medicine | Admitting: Family Medicine

## 2022-04-18 ENCOUNTER — Encounter (HOSPITAL_COMMUNITY): Payer: Self-pay | Admitting: Emergency Medicine

## 2022-04-18 DIAGNOSIS — Z23 Encounter for immunization: Secondary | ICD-10-CM | POA: Diagnosis not present

## 2022-04-18 DIAGNOSIS — Z7984 Long term (current) use of oral hypoglycemic drugs: Secondary | ICD-10-CM | POA: Diagnosis not present

## 2022-04-18 DIAGNOSIS — R109 Unspecified abdominal pain: Secondary | ICD-10-CM

## 2022-04-18 DIAGNOSIS — K859 Acute pancreatitis without necrosis or infection, unspecified: Secondary | ICD-10-CM | POA: Insufficient documentation

## 2022-04-18 DIAGNOSIS — N1832 Chronic kidney disease, stage 3b: Secondary | ICD-10-CM | POA: Diagnosis not present

## 2022-04-18 DIAGNOSIS — Z79899 Other long term (current) drug therapy: Secondary | ICD-10-CM | POA: Diagnosis not present

## 2022-04-18 DIAGNOSIS — J449 Chronic obstructive pulmonary disease, unspecified: Secondary | ICD-10-CM | POA: Diagnosis not present

## 2022-04-18 DIAGNOSIS — I2510 Atherosclerotic heart disease of native coronary artery without angina pectoris: Secondary | ICD-10-CM | POA: Insufficient documentation

## 2022-04-18 DIAGNOSIS — I5032 Chronic diastolic (congestive) heart failure: Secondary | ICD-10-CM | POA: Diagnosis present

## 2022-04-18 DIAGNOSIS — R0902 Hypoxemia: Secondary | ICD-10-CM

## 2022-04-18 DIAGNOSIS — E1122 Type 2 diabetes mellitus with diabetic chronic kidney disease: Secondary | ICD-10-CM | POA: Diagnosis not present

## 2022-04-18 DIAGNOSIS — I5042 Chronic combined systolic (congestive) and diastolic (congestive) heart failure: Secondary | ICD-10-CM | POA: Diagnosis not present

## 2022-04-18 DIAGNOSIS — I130 Hypertensive heart and chronic kidney disease with heart failure and stage 1 through stage 4 chronic kidney disease, or unspecified chronic kidney disease: Secondary | ICD-10-CM | POA: Diagnosis not present

## 2022-04-18 DIAGNOSIS — Z7982 Long term (current) use of aspirin: Secondary | ICD-10-CM | POA: Insufficient documentation

## 2022-04-18 DIAGNOSIS — J9601 Acute respiratory failure with hypoxia: Principal | ICD-10-CM | POA: Insufficient documentation

## 2022-04-18 DIAGNOSIS — Z7952 Long term (current) use of systemic steroids: Secondary | ICD-10-CM | POA: Diagnosis not present

## 2022-04-18 DIAGNOSIS — Z87891 Personal history of nicotine dependence: Secondary | ICD-10-CM | POA: Insufficient documentation

## 2022-04-18 DIAGNOSIS — Z1152 Encounter for screening for COVID-19: Secondary | ICD-10-CM | POA: Diagnosis not present

## 2022-04-18 DIAGNOSIS — J189 Pneumonia, unspecified organism: Secondary | ICD-10-CM

## 2022-04-18 DIAGNOSIS — E119 Type 2 diabetes mellitus without complications: Secondary | ICD-10-CM

## 2022-04-18 LAB — COMPREHENSIVE METABOLIC PANEL
ALT: 13 U/L (ref 0–44)
AST: 28 U/L (ref 15–41)
Albumin: 3 g/dL — ABNORMAL LOW (ref 3.5–5.0)
Alkaline Phosphatase: 133 U/L — ABNORMAL HIGH (ref 38–126)
Anion gap: 10 (ref 5–15)
BUN: 35 mg/dL — ABNORMAL HIGH (ref 8–23)
CO2: 26 mmol/L (ref 22–32)
Calcium: 10.2 mg/dL (ref 8.9–10.3)
Chloride: 102 mmol/L (ref 98–111)
Creatinine, Ser: 2.28 mg/dL — ABNORMAL HIGH (ref 0.61–1.24)
GFR, Estimated: 31 mL/min — ABNORMAL LOW (ref >60)
Glucose, Bld: 125 mg/dL — ABNORMAL HIGH (ref 70–99)
Potassium: 4.6 mmol/L (ref 3.5–5.1)
Sodium: 138 mmol/L (ref 135–145)
Total Bilirubin: 1.1 mg/dL (ref 0.3–1.2)
Total Protein: 6.6 g/dL (ref 6.5–8.1)

## 2022-04-18 LAB — CBC WITH DIFFERENTIAL/PLATELET
Abs Immature Granulocytes: 0.05 10*3/uL (ref 0.00–0.07)
Basophils Absolute: 0 10*3/uL (ref 0.0–0.1)
Basophils Relative: 0 %
Eosinophils Absolute: 0.2 10*3/uL (ref 0.0–0.5)
Eosinophils Relative: 2 %
HCT: 45.4 % (ref 39.0–52.0)
Hemoglobin: 15.8 g/dL (ref 13.0–17.0)
Immature Granulocytes: 1 %
Lymphocytes Relative: 15 %
Lymphs Abs: 1.6 10*3/uL (ref 0.7–4.0)
MCH: 36.7 pg — ABNORMAL HIGH (ref 26.0–34.0)
MCHC: 34.8 g/dL (ref 30.0–36.0)
MCV: 105.6 fL — ABNORMAL HIGH (ref 80.0–100.0)
Monocytes Absolute: 0.8 10*3/uL (ref 0.1–1.0)
Monocytes Relative: 8 %
Neutro Abs: 7.8 10*3/uL — ABNORMAL HIGH (ref 1.7–7.7)
Neutrophils Relative %: 74 %
Platelets: 209 10*3/uL (ref 150–400)
RBC: 4.3 MIL/uL (ref 4.22–5.81)
RDW: 12.3 % (ref 11.5–15.5)
WBC: 10.5 10*3/uL (ref 4.0–10.5)
nRBC: 0 % (ref 0.0–0.2)

## 2022-04-18 LAB — LIPASE, BLOOD: Lipase: 312 U/L — ABNORMAL HIGH (ref 11–51)

## 2022-04-18 NOTE — ED Provider Triage Note (Signed)
Emergency Medicine Provider Triage Evaluation Note  Charles Krueger , a 66 y.o. male  was evaluated in triage.  Pt complains of 1 week of abdominal pain with intermittent diarrhea.  Abdominal pain described as around the bellybutton and just below, however overall with some "soreness" in a bandlike fashion of that area.  Pain described as sore, throbbing, and intermittent.  Denies fever, chills, vomiting, recent constipation, or blood in the stool.  Does endorse mild 1-2 episodes of dark bowel movements a few weeks ago, however none since then.  Denies urinary symptoms, penile symptoms, or testicular symptoms.  Hx of pancreatitis, however patient states this feels different.  Review of Systems  Positive:  Negative: See above  Physical Exam  BP (!) 124/90   Pulse 82   Temp 98.1 F (36.7 C) (Oral)   Resp 18   SpO2 97%  Gen:   Awake, no distress   Resp:  Normal effort  MSK:   Moves extremities without difficulty  Other:  Mild tenderness of the abdomen between the umbilicus and suprapubic regions.  No flank tenderness.  Chest non-TTP.  Abdomen soft.  Area of umbilicus suspicious for possible mild hernia.  No epigastric tenderness.  Medical Decision Making  Medically screening exam initiated at 6:07 PM.  Appropriate orders placed.  Charles Krueger was informed that the remainder of the evaluation will be completed by another provider, this initial triage assessment does not replace that evaluation, and the importance of remaining in the ED until their evaluation is complete.     Charles Rome, PA-C 40/10/27 1812

## 2022-04-18 NOTE — ED Triage Notes (Signed)
Patient c/o generalized abdominal pain and diarrhea x 1 week.

## 2022-04-19 ENCOUNTER — Observation Stay (HOSPITAL_COMMUNITY): Payer: No Typology Code available for payment source

## 2022-04-19 ENCOUNTER — Emergency Department (HOSPITAL_COMMUNITY): Payer: No Typology Code available for payment source

## 2022-04-19 DIAGNOSIS — R109 Unspecified abdominal pain: Secondary | ICD-10-CM | POA: Diagnosis not present

## 2022-04-19 DIAGNOSIS — R0902 Hypoxemia: Secondary | ICD-10-CM

## 2022-04-19 DIAGNOSIS — K859 Acute pancreatitis without necrosis or infection, unspecified: Secondary | ICD-10-CM | POA: Diagnosis present

## 2022-04-19 LAB — URINALYSIS, ROUTINE W REFLEX MICROSCOPIC
Bilirubin Urine: NEGATIVE
Glucose, UA: >=500 mg/dL — AB
Hgb urine dipstick: NEGATIVE
Ketones, ur: NEGATIVE mg/dL
Leukocytes,Ua: NEGATIVE
Nitrite: NEGATIVE
Protein, ur: >=300 mg/dL — AB
Specific Gravity, Urine: 1.023 (ref 1.005–1.030)
pH: 5 (ref 5.0–8.0)

## 2022-04-19 LAB — RESP PANEL BY RT-PCR (FLU A&B, COVID) ARPGX2
Influenza A by PCR: NEGATIVE
Influenza B by PCR: NEGATIVE
SARS Coronavirus 2 by RT PCR: NEGATIVE

## 2022-04-19 LAB — HIV ANTIBODY (ROUTINE TESTING W REFLEX): HIV Screen 4th Generation wRfx: NONREACTIVE

## 2022-04-19 MED ORDER — HYDRALAZINE HCL 50 MG PO TABS
75.0000 mg | ORAL_TABLET | Freq: Three times a day (TID) | ORAL | Status: DC
  Administered 2022-04-19 – 2022-04-20 (×2): 75 mg via ORAL
  Filled 2022-04-19 (×2): qty 1

## 2022-04-19 MED ORDER — UMECLIDINIUM BROMIDE 62.5 MCG/ACT IN AEPB
1.0000 | INHALATION_SPRAY | Freq: Every day | RESPIRATORY_TRACT | Status: DC
  Filled 2022-04-19: qty 7

## 2022-04-19 MED ORDER — IPRATROPIUM BROMIDE 0.03 % NA SOLN: 2.0000 | Freq: Every day | NASAL | Status: DC

## 2022-04-19 MED ORDER — POLYETHYLENE GLYCOL 3350 17 G PO PACK: 17.0000 g | PACK | Freq: Every day | ORAL | Status: DC | PRN

## 2022-04-19 MED ORDER — HYDROMORPHONE HCL 2 MG PO TABS: 1.0000 mg | ORAL_TABLET | ORAL | Status: DC | PRN

## 2022-04-19 MED ORDER — SODIUM CHLORIDE 0.9 % IV SOLN
1.0000 g | Freq: Once | INTRAVENOUS | Status: AC
  Administered 2022-04-19: 1 g via INTRAVENOUS
  Filled 2022-04-19: qty 10

## 2022-04-19 MED ORDER — ONDANSETRON HCL 4 MG/2ML IJ SOLN: 4.0000 mg | Freq: Four times a day (QID) | INTRAMUSCULAR | Status: DC | PRN

## 2022-04-19 MED ORDER — ACETAMINOPHEN 325 MG PO TABS
650.0000 mg | ORAL_TABLET | Freq: Four times a day (QID) | ORAL | Status: DC
  Administered 2022-04-19 – 2022-04-20 (×2): 650 mg via ORAL
  Filled 2022-04-19 (×3): qty 2

## 2022-04-19 MED ORDER — ONDANSETRON HCL 4 MG/2ML IJ SOLN
4.0000 mg | Freq: Once | INTRAMUSCULAR | Status: AC
  Administered 2022-04-19: 4 mg via INTRAVENOUS
  Filled 2022-04-19: qty 2

## 2022-04-19 MED ORDER — SODIUM CHLORIDE 0.9 % IV SOLN
500.0000 mg | Freq: Once | INTRAVENOUS | Status: AC
  Administered 2022-04-19: 500 mg via INTRAVENOUS
  Filled 2022-04-19: qty 5

## 2022-04-19 MED ORDER — ATORVASTATIN CALCIUM 40 MG PO TABS
40.0000 mg | ORAL_TABLET | Freq: Every day | ORAL | Status: DC
  Administered 2022-04-20: 40 mg via ORAL
  Filled 2022-04-19: qty 1

## 2022-04-19 MED ORDER — ISOSORBIDE MONONITRATE ER 30 MG PO TB24
30.0000 mg | ORAL_TABLET | Freq: Every day | ORAL | Status: DC
  Administered 2022-04-20: 30 mg via ORAL
  Filled 2022-04-19: qty 1

## 2022-04-19 MED ORDER — ARFORMOTEROL TARTRATE 15 MCG/2ML IN NEBU: 15.0000 ug | INHALATION_SOLUTION | Freq: Two times a day (BID) | RESPIRATORY_TRACT | Status: DC

## 2022-04-19 MED ORDER — ALBUTEROL SULFATE (2.5 MG/3ML) 0.083% IN NEBU: 2.5000 mg | INHALATION_SOLUTION | Freq: Four times a day (QID) | RESPIRATORY_TRACT | Status: DC | PRN

## 2022-04-19 MED ORDER — FUROSEMIDE 20 MG PO TABS: 20.0000 mg | ORAL_TABLET | Freq: Every day | ORAL | Status: DC

## 2022-04-19 MED ORDER — ENOXAPARIN SODIUM 30 MG/0.3ML IJ SOSY: 30.0000 mg | PREFILLED_SYRINGE | INTRAMUSCULAR | Status: DC

## 2022-04-19 MED ORDER — SODIUM CHLORIDE 0.9 % IV BOLUS
1000.0000 mL | Freq: Once | INTRAVENOUS | Status: AC
  Administered 2022-04-19: 1000 mL via INTRAVENOUS

## 2022-04-19 MED ORDER — CARVEDILOL 25 MG PO TABS
25.0000 mg | ORAL_TABLET | Freq: Two times a day (BID) | ORAL | Status: DC
  Administered 2022-04-20: 25 mg via ORAL
  Filled 2022-04-19: qty 1

## 2022-04-19 MED ORDER — SACUBITRIL-VALSARTAN 97-103 MG PO TABS
1.0000 | ORAL_TABLET | Freq: Two times a day (BID) | ORAL | Status: DC
  Administered 2022-04-19 – 2022-04-20 (×2): 1 via ORAL
  Filled 2022-04-19 (×4): qty 1

## 2022-04-19 MED ORDER — SODIUM CHLORIDE 0.9 % IV SOLN
500.0000 mg | INTRAVENOUS | Status: DC
  Filled 2022-04-19: qty 5

## 2022-04-19 MED ORDER — SODIUM CHLORIDE 0.9 % IV SOLN: 1.0000 g | INTRAVENOUS | Status: DC

## 2022-04-19 MED ORDER — SPIRONOLACTONE 25 MG PO TABS
25.0000 mg | ORAL_TABLET | Freq: Every day | ORAL | Status: DC
  Administered 2022-04-20: 25 mg via ORAL
  Filled 2022-04-19: qty 1

## 2022-04-19 MED ORDER — ONDANSETRON HCL 4 MG PO TABS: 4.0000 mg | ORAL_TABLET | Freq: Four times a day (QID) | ORAL | Status: DC | PRN

## 2022-04-19 MED ORDER — FLUTICASONE PROPIONATE 50 MCG/ACT NA SUSP
2.0000 | Freq: Every day | NASAL | Status: DC
  Administered 2022-04-20: 2 via NASAL
  Filled 2022-04-19: qty 16

## 2022-04-19 MED ORDER — MONTELUKAST SODIUM 10 MG PO TABS: 10.0000 mg | ORAL_TABLET | Freq: Every day | ORAL | Status: DC

## 2022-04-19 MED ORDER — MORPHINE SULFATE (PF) 4 MG/ML IV SOLN
4.0000 mg | Freq: Once | INTRAVENOUS | Status: AC
  Administered 2022-04-19: 4 mg via INTRAVENOUS
  Filled 2022-04-19: qty 1

## 2022-04-19 MED ORDER — ASPIRIN 81 MG PO TBEC: 81.0000 mg | DELAYED_RELEASE_TABLET | Freq: Every day | ORAL | Status: DC

## 2022-04-19 MED ORDER — ADULT MULTIVITAMIN W/MINERALS CH
1.0000 | ORAL_TABLET | Freq: Every day | ORAL | Status: DC
  Administered 2022-04-20: 1 via ORAL
  Filled 2022-04-19: qty 1

## 2022-04-19 MED ORDER — OMEGA 3 1200 MG PO CAPS: 1200.0000 mg | ORAL_CAPSULE | Freq: Every day | ORAL | Status: DC

## 2022-04-19 MED ORDER — EMPAGLIFLOZIN 10 MG PO TABS
10.0000 mg | ORAL_TABLET | Freq: Every day | ORAL | Status: DC
  Administered 2022-04-20: 10 mg via ORAL
  Filled 2022-04-19: qty 1

## 2022-04-19 MED ORDER — ACETAMINOPHEN 650 MG RE SUPP: 650.0000 mg | Freq: Four times a day (QID) | RECTAL | Status: DC

## 2022-04-19 NOTE — Progress Notes (Signed)
Pt transported here from ED; alert & oriented x4 & ambulatory.

## 2022-04-19 NOTE — H&P (Addendum)
Hospital Admission History and Physical Service Pager: [phone]  Patient name: Charles Krueger Medical record number: [id number] Date of Birth: 02-16-1956 Age: 66 y.o. Gender: male  Primary Care Provider: [facility] Consultants: none Code Status: FULL Preferred Emergency Contact:  Contact Information     Name Relation Home Work [place] [phone]          Chief Complaint: abdominal pain  Assessment and Plan: Charles Krueger is a 66 y.o. male presenting with abdominal pain concerning for mild episode of acute pancreatitis.  Also with CT findings suggestive of pneumonia, with new O2 requirement.  Pertinent medical history includes pancreatitis, CHF, COPD, HTN, T2DM, CAD.  * Hypoxemia Secondary to possible pneumonia.  Recent history of cough productive of white sputum x1 week. O2 desaturations to mid 80s in ED.  Improved with nasal cannula.  Reassuringly, has not had SOB/CP or fevers.  No leukocytosis.  CT with bibasilar infiltrates suggestive of possible pneumonia versus atelectasis versus aspiration.  Patient also has underlying COPD which may be contributing.  Cough and pancreatitis may also be contributing to atelectasis.  Respiratory exam is reassuring.  Low suspicion for CHF exacerbation given euvolemia on exam.  Was started on empiric ceftriaxone and azithromycin for CAP in the ED.  Given his oxygen requirement and CT findings, patient will benefit from admission for further monitoring. -Admit to MedSurg, FMTS, attending Dr. Gwendlyn Deutscher -Continue home Brovana, Atrovent, Incruse Ellipta, PRN albuterol -Supplemental O2 as needed, please wean as tolerated, goal O2 saturation of 88 to 92% given history of COPD -Continue empiric azithromycin and ceftriaxone, may consider DC if clinically improving and able to wean oxygen -Incentive spirometry  Acute pancreatitis Presents with midline abdominal pain and intermittent diarrhea x1 week.  Has a history of  pancreatitis, pancreatic duct stricture, cirrhosis.  Elevated lipase to 312 with CT findings suggestive of possible acute pancreatitis.  Unclear etiology of this, but may be related to reported history of heavy ethanol use (last drink 3 weeks ago).  No RUQ tenderness on exam with negative Murphy sign, no known history of gallstones, low suspicion for gallstone pancreatitis.  Will check lipid panel to rule out hypertriglyceridemia.  Reassuringly, patient's abdominal pain has improved and he is tolerating p.o. Initial plan for discharge home from a pancreatitis standpoint. -Encourage p.o. hydration, consider IV fluids if needed (currently holding off on aggressive fluids due to history of CHF) -A.m. lipid panel -Scheduled Tylenol, as needed oral Dilaudid for breakthrough pain -Advance diet as tolerated -RUQ ultrasound to r/o gallstones -May need repeat imaging outpatient to rule out underlying pancreatic neoplasm, consider inpatient surgery consult for possible MRCP -PRN miralax, zofran  Chronic combined systolic and diastolic heart failure ([place]) Echo in 2022 with EF 55 - 60%.  Euvolemic on exam. -Continue home Coreg, Jardiance, Entresto, Aldactone  Stage 3b chronic kidney disease ([place]) Stable.  Creatinine 2.28 on admission, baseline unclear given lack of recent data, most recent CR 2.03 in 5/23. -Monitor BMP  Diabetes mellitus ([place]) Currently diet controlled, metformin dc'd in 4/23. Unable to see last A1c -AM A1c     Chronic, stable: HLD: continue Lipitor, omega-3 CAD: Continue home aspirin Tobacco use: home nicotine patch (patient brought with him)  FEN/GI: Heart diet, advance as tolearted, prn miralax and zofran VTE Prophylaxis: lovenox  Disposition: medsurg, possible discharge home tomorrow  History of Present Illness:  Charles Krueger is a 66 y.o. male presenting with abdominal pain.  Patient reports he has had intermittent periumbilical/epigastric  abdominal pain radiating to his  back that started about 1.5 weeks ago but worsened yesterday. Usually pain improves after eating but then worsens. He has had hiccups productive cough of clear sputum which worsens his abdominal pain. He has been able to eat the past few days. Denies fever, chest pain, SOB. No sick contacts. He does not use oxygen recently. Last BM was yesterday, he has had some loose stools. He thinks his last colonoscopy was about a year ago, was told he had polyps.  In the ED, CT Abd/Pelvis performed and showed evidence of possible pneumonia as well as possible acute pancreatitis. Started on CTX and azithromycin. Received NS bolus x1. Received morphine 4mg  IV x1. Received Zofran 4mg  IV x1.   Review Of Systems: Per HPI with the following additions:  Review of Systems  Constitutional:  Negative for fever.  Respiratory:  Positive for cough. Negative for shortness of breath.   Cardiovascular:  Negative for chest pain.  Gastrointestinal:  Positive for abdominal pain, diarrhea and nausea. Negative for blood in stool and vomiting.  Musculoskeletal:  Positive for back pain.  Weight loss of 60 lbs in the course of 7-8 months (also was diuresed during a recent hospitalization)  Pertinent Past Medical History: T2DM HTN CHF CAD (he reports he had a "silent" heart attack, never went to the hospital) Pancreatitis Remainder reviewed in history tab.   Pertinent Past Surgical History: No prior abdominal surgeries  Remainder reviewed in history tab.  Pertinent Social History: Tobacco use: Yes/No/Former 2-3 cigars per week since 1976 (formerly smoking cigarettes) Alcohol use: none recently, last drink was 3 weeks ago. Usually 3-4 drinks per week of hard liquor. Former heavy alcohol use when he was in the [employer]. Other Substance use: denies Lives with wife, son  Pertinent Family History: Mother - CHF No FMHx of pancreatitis or cancers  Remainder reviewed in history tab.   Important Outpatient Medications:       Remainder reviewed in medication history.   Objective: BP (!) 148/92   Pulse 66   Temp 97.9 F (36.6 C)   Resp 18   SpO2 93%  Exam: General: NAD, alert and responsive, pleasant Cardiovascular: RRR no mrg Respiratory: Mildly diminished lung sounds in b/l bases, no crackles or wheezes appreciated Gastrointestinal: Soft, mild discomfort to deep palpation of epigastric region, otherwise nontender/nondistended MSK: moves all extremities  Labs:  CBC BMET  Recent Labs  Lab 04/18/22 1805  WBC 10.5  HGB 15.8  HCT 45.4  PLT 209   Recent Labs  Lab 04/18/22 1805  NA 138  K 4.6  CL 102  CO2 26  BUN 35*  CREATININE 2.28*  GLUCOSE 125*  CALCIUM 10.2     Lipase 312 UA: glucose, protein, rare bacteria  Imaging Studies Performed:  CT A/P WO CONTRAST IMPRESSION: 1. Infiltrates in the lung bases, possibly pneumonia, atelectasis, or aspiration. 2. Fullness in the head and uncinate process of the pancreas with loss of fat planes and enlarging fluid collection since previous study. Changes may represent acute pancreatitis with increasing size of pseudocyst but an underlying pancreatic neoplasm should be excluded and follow-up after resolution of acute process is recommended. 3. Stable appearance of left adrenal gland adenoma. No imaging follow-up is indicated. 4. 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 No change. 5. Aortic atherosclerosis.   August Albino, MD 04/19/2022, 3:50 PM PGY-1, [facility] Intern pager: [phone], text pages welcome Secure chat group [place]  Hospital Teaching Service   I was personally present and performed or re-performed the history, physical exam and medical decision making activities of this service and have verified that the service and findings are accurately documented in the resident's note.  Zola Button, MD                  04/19/2022, 4:56 PM

## 2022-04-19 NOTE — Hospital Course (Addendum)
Charles Krueger is a 66 y.o.male with a history of pancreatitis, sarcoidosis, CHF, COPD, HTN, T2DM, CAD, CKD 3B who was admitted to the family medicine teaching Service at [facility] for acute pancreatitis and hypoxemia. His hospital course is detailed below:   Hypoxemia, ?CAP Presented with desaturations in the ED to mid 80s.  Improved with low-flow nasal cannula.  Had a recent history of cough, CT abdomen/pelvis was performed (due to abdominal pain) and showed evidence of possible pneumonia.  Remained afebrile without leukocytosis during his stay.  Was started on empiric azithromycin and ceftriaxone IV.  On discharge he was sent home with p.o. azithromycin for total course of 3 days and cefadroxil for total course of 5 days. Blood cx NGTD, COVID/Flu neg. Was successfully weaned to room air prior to discharge.  Acute pancreatitis Presented with recurrent abdominal pain, has a history of pancreatitis.  Follows with GI outpatient, sees the [place] [facility].  CT abdomen/pelvis with evidence of acute pancreatitis.  Lipase 312.  However, his presentation was mild and his abdominal pain significantly improved.  Suspect may be related to chronic ethanol use.  Lipid panel without significant hypertriglyceridemia.  RUQ ultrasound without gallstones.  He was tolerating p.o. and the decision was made to hold off on IV fluids given his history of CHF and adequate p.o. hydration.  Patient's abdominal pain resolved by the time of discharge.  Recommend follow-up with GI outpatient for his known pancreatitis.  CT unable to rule out possible underlying neoplasm, may benefit from MRCP.  CHF, chronic Continued on home medications - Coreg, Jardiance, Entresto, Aldactone  History of diabetes A1c 5.0.  Metformin was DC'd in 4/23.  Did not require medication during his stay.  Other chronic conditions were medically managed with home medications and formulary alternatives as necessary   Issues for follow up: See CT below -  recommend f/u of pancreatic pseudocyst vs neoplasm (follows w/ GI) Adrenal adenoma noted, no imaging f/u indicated   PERTINENT IMAGING  CT ABD/PELVIS IMPRESSION: 1. Infiltrates in the lung bases, possibly pneumonia, atelectasis, or aspiration. 2. Fullness in the head and uncinate process of the pancreas with loss of fat planes and enlarging fluid collection since previous study. Changes may represent acute pancreatitis with increasing size of pseudocyst but an underlying pancreatic neoplasm should be excluded and follow-up after resolution of acute process is recommended. 3. Stable appearance of left adrenal gland adenoma. No imaging follow-up is indicated. 4. 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 No change. 5. Aortic atherosclerosis.   RUQ US IMPRESSION: 1. Continued findings of acute complicated pancreatitis. Please see CT discussion from earlier today. 2. Otherwise unremarkable right upper quadrant ultrasound.

## 2022-04-19 NOTE — ED Notes (Signed)
ED TO INPATIENT HANDOFF REPORT  ED Nurse Name and Phone #: [phone]   S Name/Age/Gender Charles Krueger 66 y.o. male Room/Bed: H018C/H018C  Code Status   Code Status: Full Code  Home/SNF/Other Home Patient oriented to: self, place, time, and situation Is this baseline? Yes   Triage Complete: Triage complete  Chief Complaint Abdominal pain [R10.9]  Triage Note Patient c/o generalized abdominal pain and diarrhea x 1 week.    Allergies Allergies  Allergen Reactions   Lisinopril Other (See Comments)    Acute renal failure    Level of Care/Admitting Diagnosis ED Disposition     ED Disposition  Admit   Condition  --   Comment  Hospital Area: [facility] [[id number]]  Level of Care: Med-Surg [16]  May place patient in observation at [facility] or [place] if equivalent level of care is available:: No  Covid Evaluation: Asymptomatic - no recent exposure (last 10 days) testing not required  Diagnosis: Abdominal pain [744753]  Admitting Physician: August Albino [id number]  Attending Physician: Kinnie Feil [[id number]]          B Medical/Surgery History Past Medical History:  Diagnosis Date   Diabetes mellitus without complication ([place])    HTN (hypertension)    OSA on CPAP    Past Surgical History:  Procedure Laterality Date   RIGHT/LEFT HEART CATH AND CORONARY ANGIOGRAPHY N/A 12/14/2020   Procedure: RIGHT/LEFT HEART CATH AND CORONARY ANGIOGRAPHY;  Surgeon: Leonie Man, MD;  Location: [facility];  Service: Cardiovascular;  Laterality: N/A;     A IV Location/Drains/Wounds Patient Lines/Drains/Airways Status     Active Line/Drains/Airways     Name Placement date Placement time Site Days   Peripheral IV 04/19/22 20 G Anterior;Left Forearm 04/19/22  0000  Forearm  less than 1            Intake/Output Last 24 hours No intake or output data in the 24 hours ending 04/19/22 1722  Labs/Imaging Results for orders placed or  performed during the hospital encounter of 04/18/22 (from the past 48 hour(s))  CBC with Differential     Status: Abnormal   Collection Time: 04/18/22  6:05 PM  Result Value Ref Range   WBC 10.5 4.0 - 10.5 K/uL   RBC 4.30 4.22 - 5.81 MIL/uL   Hemoglobin 15.8 13.0 - 17.0 g/dL   HCT 45.4 39.0 - 52.0 %   MCV 105.6 (H) 80.0 - 100.0 fL   MCH 36.7 (H) 26.0 - 34.0 pg   MCHC 34.8 30.0 - 36.0 g/dL   RDW 12.3 11.5 - 15.5 %   Platelets 209 150 - 400 K/uL   nRBC 0.0 0.0 - 0.2 %   Neutrophils Relative % 74 %   Neutro Abs 7.8 (H) 1.7 - 7.7 K/uL   Lymphocytes Relative 15 %   Lymphs Abs 1.6 0.7 - 4.0 K/uL   Monocytes Relative 8 %   Monocytes Absolute 0.8 0.1 - 1.0 K/uL   Eosinophils Relative 2 %   Eosinophils Absolute 0.2 0.0 - 0.5 K/uL   Basophils Relative 0 %   Basophils Absolute 0.0 0.0 - 0.1 K/uL   Immature Granulocytes 1 %   Abs Immature Granulocytes 0.05 0.00 - 0.07 K/uL    Comment: Performed at [facility], [address] [place], [place] [address]  Comprehensive metabolic panel     Status: Abnormal   Collection Time: 04/18/22  6:05 PM  Result Value Ref Range   Sodium  138 135 - 145 mmol/L   Potassium 4.6 3.5 - 5.1 mmol/L    Comment: HEMOLYSIS AT THIS LEVEL MAY AFFECT RESULT   Chloride 102 98 - 111 mmol/L   CO2 26 22 - 32 mmol/L   Glucose, Bld 125 (H) 70 - 99 mg/dL    Comment: Glucose reference range applies only to samples taken after fasting for at least 8 hours.   BUN 35 (H) 8 - 23 mg/dL   Creatinine, Ser 8.32 (H) 0.61 - 1.24 mg/dL   Calcium 54.9 8.9 - 82.6 mg/dL   Total Protein 6.6 6.5 - 8.1 g/dL   Albumin 3.0 (L) 3.5 - 5.0 g/dL   AST 28 15 - 41 U/L    Comment: HEMOLYSIS AT THIS LEVEL MAY AFFECT RESULT   ALT 13 0 - 44 U/L    Comment: HEMOLYSIS AT THIS LEVEL MAY AFFECT RESULT   Alkaline Phosphatase 133 (H) 38 - 126 U/L   Total Bilirubin 1.1 0.3 - 1.2 mg/dL    Comment: HEMOLYSIS AT THIS LEVEL MAY AFFECT RESULT   GFR, Estimated 31 (L) >60 mL/min    Comment:  (NOTE) Calculated using the CKD-EPI Creatinine Equation (2021)    Anion gap 10 5 - 15    Comment: Performed at [facility], [address] [place], [place] [address]  Lipase, blood     Status: Abnormal   Collection Time: 04/18/22  6:05 PM  Result Value Ref Range   Lipase 312 (H) 11 - 51 U/L    Comment: Performed at [facility], [address] [place], [place] [address]  Urinalysis, Routine w reflex microscopic     Status: Abnormal   Collection Time: 04/19/22 12:15 AM  Result Value Ref Range   Color, Urine AMBER (A) YELLOW    Comment: BIOCHEMICALS MAY BE AFFECTED BY COLOR   APPearance CLEAR CLEAR   Specific Gravity, Urine 1.023 1.005 - 1.030   pH 5.0 5.0 - 8.0   Glucose, UA >=500 (A) NEGATIVE mg/dL   Hgb urine dipstick NEGATIVE NEGATIVE   Bilirubin Urine NEGATIVE NEGATIVE   Ketones, ur NEGATIVE NEGATIVE mg/dL   Protein, ur >=680 (A) NEGATIVE mg/dL   Nitrite NEGATIVE NEGATIVE   Leukocytes,Ua NEGATIVE NEGATIVE   RBC / HPF 0-5 0 - 5 RBC/hpf   WBC, UA 0-5 0 - 5 WBC/hpf   Bacteria, UA RARE (A) NONE SEEN   Mucus PRESENT    Hyaline Casts, UA PRESENT     Comment: Performed at [facility], [address], [place], [place] [address]  Resp Panel by RT-PCR (Flu A&B, Covid)     Status: None   Collection Time: 04/19/22  2:21 PM   Specimen: Nasal Swab  Result Value Ref Range   SARS Coronavirus 2 by RT PCR NEGATIVE NEGATIVE    Comment: (NOTE) SARS-CoV-2 target nucleic acids are NOT DETECTED.  The SARS-CoV-2 RNA is generally detectable in upper respiratory specimens during the acute phase of infection. The lowest concentration of SARS-CoV-2 viral copies this assay can detect is 138 copies/mL. A negative result does not preclude SARS-Cov-2 infection and should not be used as the sole basis for treatment or other patient management decisions. A negative result may occur with  improper specimen collection/handling, submission of specimen other than nasopharyngeal swab,  presence of viral mutation(s) within the areas targeted by this assay, and inadequate number of viral copies(<138 copies/mL). A negative result must be combined with clinical observations, patient history, and epidemiological information.  The expected result is Negative.  Fact Sheet for Patients:  EntrepreneurPulse.com.au  Fact Sheet for Healthcare Providers:  IncredibleEmployment.be  This test is no t yet approved or cleared by the [place] FDA and  has been authorized for detection and/or diagnosis of SARS-CoV-2 by FDA under an Emergency Use Authorization (EUA). This EUA will remain  in effect (meaning this test can be used) for the duration of the COVID-19 declaration under Section 564(b)(1) of the Act, 21 U.S.C.section 360bbb-3(b)(1), unless the authorization is terminated  or revoked sooner.       Influenza A by PCR NEGATIVE NEGATIVE   Influenza B by PCR NEGATIVE NEGATIVE    Comment: (NOTE) The Xpert Xpress SARS-CoV-2/FLU/RSV plus assay is intended as an aid in the diagnosis of influenza from Nasopharyngeal swab specimens and should not be used as a sole basis for treatment. Nasal washings and aspirates are unacceptable for Xpert Xpress SARS-CoV-2/FLU/RSV testing.  Fact Sheet for Patients: EntrepreneurPulse.com.au  Fact Sheet for Healthcare Providers: IncredibleEmployment.be  This test is not yet approved or cleared by the [place] FDA and has been authorized for detection and/or diagnosis of SARS-CoV-2 by FDA under an Emergency Use Authorization (EUA). This EUA will remain in effect (meaning this test can be used) for the duration of the COVID-19 declaration under Section 564(b)(1) of the Act, 21 U.S.C. section 360bbb-3(b)(1), unless the authorization is terminated or revoked.  Performed at [facility], [place] [address], [place], [place] [address]   HIV Antibody (routine testing  w rflx)     Status: None   Collection Time: 04/19/22  3:05 PM  Result Value Ref Range   HIV Screen 4th Generation wRfx Non Reactive Non Reactive    Comment: Performed at [facility], [place] [address] [place], [place] [address]   US Abdomen Limited RUQ (LIVER/GB)  Result Date: 04/19/2022 CLINICAL DATA:  [id number] Abdominal pain [id number] EXAM: ULTRASOUND ABDOMEN LIMITED RIGHT UPPER QUADRANT COMPARISON:  04/19/2022 FINDINGS: Gallbladder: No gallstones or wall thickening visualized. No sonographic Murphy sign noted by sonographer. Common bile duct: Diameter: 5 mm Liver: No focal lesion identified. Within normal limits in parenchymal echogenicity. Portal vein is patent on color Doppler imaging with normal direction of blood flow towards the liver. Other: Heterogeneous enlargement the pancreatic head measuring up to 6.5 x 5.9 by 7.0 cm corresponds to the area of suspected acute pancreatitis and likely underlying pseudocyst seen on earlier CT. Incidental benign 1.5 cm right renal cyst is not require imaging follow-up. IMPRESSION: 1. Continued findings of acute complicated pancreatitis. Please see CT discussion from earlier today. 2. Otherwise unremarkable right upper quadrant ultrasound. Electronically Signed   By: Randa Ngo M.D.   On: 04/19/2022 17:12   CT ABDOMEN PELVIS WO CONTRAST  Result Date: 04/19/2022 CLINICAL DATA:  Left lower quadrant abdominal pain, nausea and vomiting. Diarrhea. History of pancreatitis. One week history of abdominal pain and diarrhea. EXAM: CT ABDOMEN AND PELVIS WITHOUT CONTRAST TECHNIQUE: Multidetector CT imaging of the abdomen and pelvis was performed following the standard protocol without IV contrast. RADIATION DOSE REDUCTION: This exam was performed according to the departmental dose-optimization program which includes automated exposure control, adjustment of the mA and/or kV according to patient size and/or use of iterative reconstruction technique. COMPARISON:   11/12/2021 FINDINGS: Lower chest: Atelectasis or infiltration demonstrated in both lung bases. Changes could represent pneumonia or aspiration. Cardiac enlargement with small pericardial effusion. Hepatobiliary: Unenhanced appearance is unremarkable. Pancreas: Characterization is limited without IV contrast material but  there is fullness in the head and uncinate process of the pancreas with a 3.2 cm diameter collection in the region of the head of the pancreas, increasing from previous study. This most likely represents pancreatitis with an increasing size of pseudo cyst. However, abscess or mass lesion are not excluded and follow-up after resolution of acute process is recommended. No pancreatic ductal dilatation. Soft tissue stranding around the head of the pancreas with loss of fat planes to the duodenal and superior mesenteric vessels. Spleen: Normal in size without focal abnormality. Adrenals/Urinary Tract: 2.4 cm diameter left adrenal gland nodule, 1 Hounsfield unit, unchanged since prior study. No imaging follow-up is indicated. Kidneys are normal, without renal calculi, focal lesion, or hydronephrosis. Bladder is unremarkable. Stomach/Bowel: Stomach, small bowel, and colon are not abnormally distended. No wall thickening or inflammatory changes. Small periumbilical hernia containing mostly fat but a small bit of small-bowel is also herniated. No proximal obstruction. Appendix is normal. Vascular/Lymphatic: Aortic atherosclerosis. No enlarged abdominal or pelvic lymph nodes. Reproductive: Prostate is unremarkable. Other: No free air or free fluid in the abdomen. Musculoskeletal: Areas of bone sclerosis and lucency with cortical thickening demonstrated in the right pelvis and right hip with less prominent involvement of the sacrum and T12 vertebra. Appearances are similar to prior studies. Changes are likely to represent Paget's disease. IMPRESSION: 1. Infiltrates in the lung bases, possibly pneumonia,  atelectasis, or aspiration. 2. Fullness in the head and uncinate process of the pancreas with loss of fat planes and enlarging fluid collection since previous study. Changes may represent acute pancreatitis with increasing size of pseudocyst but an underlying pancreatic neoplasm should be excluded and follow-up after resolution of acute process is recommended. 3. Stable appearance of left adrenal gland adenoma. No imaging follow-up is indicated. 4. 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 No change. 5. Aortic atherosclerosis. Electronically Signed   By: Lucienne Capers M.D.   On: 04/19/2022 02:18    Pending Labs Unresulted Labs (From admission, onward)     Start     Ordered   04/20/22 0500  Lipid panel  Tomorrow morning,   R        04/19/22 1429   04/20/22 0500  Hemoglobin A1c  Tomorrow morning,   R        04/19/22 1429   04/20/22 0500  Comprehensive metabolic panel  Tomorrow morning,   R        04/19/22 1429   04/20/22 0500  CBC  Tomorrow morning,   R        04/19/22 1429   04/19/22 1221  Culture, blood (routine x 2)  BLOOD CULTURE X 2,   R (with STAT occurrences)      04/19/22 1221            Vitals/Pain Today's Vitals   04/19/22 1230 04/19/22 1408 04/19/22 1410 04/19/22 1512  BP:   (!) 148/92   Pulse:   66   Resp:   18   Temp:    97.9 F (36.6 C)  TempSrc:      SpO2:   93%   PainSc: 7  0-No pain      Isolation Precautions Airborne and Contact precautions  Medications Medications  aspirin tablet 162 mg (has no administration in time range)  atorvastatin (LIPITOR) tablet 40 mg (has no administration in time range)  carvedilol (COREG) tablet 25 mg (has no administration in time range)  hydrALAZINE (APRESOLINE) tablet 75 mg (has no administration in  time range)  isosorbide mononitrate (IMDUR) 24 hr tablet 30 mg (has no administration in time range)  sacubitril-valsartan (ENTRESTO) 97-103 mg per tablet (has no administration in time range)   spironolactone (ALDACTONE) tablet 25 mg (has no administration in time range)  empagliflozin (JARDIANCE) tablet 10 mg (has no administration in time range)  multivitamin with minerals tablet 1 tablet (has no administration in time range)  Omega 3 CAPS 1,200 mg (has no administration in time range)  albuterol (PROVENTIL) (2.5 MG/3ML) 0.083% nebulizer solution 2.5 mg (has no administration in time range)  fluticasone (FLONASE) 50 MCG/ACT nasal spray 2 spray (has no administration in time range)  ipratropium (ATROVENT) 0.03 % nasal spray 2 spray (has no administration in time range)  arformoterol (BROVANA) nebulizer solution 15 mcg (has no administration in time range)    And  umeclidinium bromide (INCRUSE ELLIPTA) 62.5 MCG/ACT 1 puff (1 puff Inhalation Not Given 04/19/22 1627)  montelukast (SINGULAIR) tablet 10 mg (has no administration in time range)  enoxaparin (LOVENOX) injection 30 mg (has no administration in time range)  acetaminophen (TYLENOL) tablet 650 mg (has no administration in time range)    Or  acetaminophen (TYLENOL) suppository 650 mg (has no administration in time range)  HYDROmorphone (DILAUDID) tablet 1 mg (has no administration in time range)  polyethylene glycol (MIRALAX / GLYCOLAX) packet 17 g (has no administration in time range)  ondansetron (ZOFRAN) tablet 4 mg (has no administration in time range)    Or  ondansetron (ZOFRAN) injection 4 mg (has no administration in time range)  azithromycin (ZITHROMAX) 500 mg in sodium chloride 0.9 % 250 mL IVPB (has no administration in time range)  cefTRIAXone (ROCEPHIN) 1 g in sodium chloride 0.9 % 100 mL IVPB (has no administration in time range)  sodium chloride 0.9 % bolus 1,000 mL (0 mLs Intravenous Stopped 04/19/22 1408)  morphine (PF) 4 MG/ML injection 4 mg (4 mg Intravenous Given 04/19/22 1251)  ondansetron (ZOFRAN) injection 4 mg (4 mg Intravenous Given 04/19/22 1250)  cefTRIAXone (ROCEPHIN) 1 g in sodium chloride 0.9 %  100 mL IVPB (0 g Intravenous Stopped 04/19/22 1408)  azithromycin (ZITHROMAX) 500 mg in sodium chloride 0.9 % 250 mL IVPB (0 mg Intravenous Stopped 04/19/22 1512)    Mobility walks Low fall risk   Focused Assessments    R Recommendations: See Admitting Provider Note  Report given to:   Additional Notes:

## 2022-04-19 NOTE — ED Provider Notes (Incomplete)
[name] EMERGENCY DEPARTMENT Provider Note   CSN: [id number] Arrival date & time: 04/18/22  1542     History {Add pertinent medical, surgical, social history, OB history to HPI:1} Chief Complaint  Patient presents with   Abdominal Pain    Charles Krueger is a 66 y.o. male.  Charles Krueger is a 66 y.o. male   Charles Krueger is a 66 y.o. male with a history of pancreatitis, cirrhosis, hypertension, hyperlipidemia, sleep apnea, who presents to the emergency department for evaluation of abdominal pain.  Patient reports he has been having periumbilical abdominal pain for about a week and a half.  He reports it is intermittent.  He reports some associated diarrhea and some nausea but is still been able to eat and drink.  He describes it as a general soreness in the center of his abdomen intermittently throbbing.  Denies vomiting, constipation or blood in the stool.  Last bowel movement was yesterday.  No dysuria or urinary frequency.  Reports prior episodes of pancreatitis have been more severe.  He is followed by GI at the [facility] and is being referred for endoscopy and colonoscopy at [facility].    Abdominal Pain      Home Medications Prior to Admission medications   Medication Sig Start Date End Date Taking? Authorizing Provider  albuterol (VENTOLIN HFA) 108 (90 Base) MCG/ACT inhaler Inhale 2 puffs into the lungs every 6 (six) hours as needed for wheezing or shortness of breath.    [provider]  allopurinol (ZYLOPRIM) 100 MG tablet Take 100 mg by mouth daily.    [provider]  aspirin 81 MG tablet Take 162 mg by mouth at bedtime.    [provider]  atorvastatin (LIPITOR) 40 MG tablet Take 1 tablet (40 mg total) by mouth daily. 01/09/21   Loel Dubonnet, NP  carvedilol (COREG) 25 MG tablet Take 25 mg by mouth 2 (two) times daily with a meal.    [provider]  empagliflozin (JARDIANCE) 10 MG TABS tablet Take 1 tablet (10 mg total) by  mouth daily. 12/23/20   Katherine Roan, MD  fluticasone (FLONASE) 50 MCG/ACT nasal spray Place 2 sprays into both nostrils daily.    [provider]  furosemide (LASIX) 20 MG tablet Take 1 tablet (20 mg total) by mouth daily. 06/13/21   Larey Dresser, MD  hydrALAZINE (APRESOLINE) 50 MG tablet Take 1.5 tablets (75 mg total) by mouth 3 (three) times daily. 05/12/21   Larey Dresser, MD  ipratropium (ATROVENT) 0.03 % nasal spray Place 2 sprays into both nostrils daily.    [provider]  isosorbide mononitrate (IMDUR) 30 MG 24 hr tablet Take 1 tablet (30 mg total) by mouth daily. 03/14/21   [name], Maricela Bo, [name]  ketotifen (ZADITOR) 0.025 % ophthalmic solution Place 1 drop into both eyes every morning.    [provider]  metFORMIN (GLUCOPHAGE) 1000 MG tablet Take 1 tablet (1,000 mg total) by mouth 2 (two) times daily with a meal. 06/23/21   Lyda Jester M, PA-C  Multiple Vitamin (MULTIVITAMIN WITH MINERALS) TABS tablet Take 1 tablet by mouth daily.    [provider]  Omega 3 1200 MG CAPS Take 1,200 mg by mouth daily.    [provider]  OXYGEN Inhale 2 L into the lungs as needed.    [provider]  potassium chloride SA (KLOR-CON) 20 MEQ tablet Take 1 tablet (20 mEq total) by mouth daily for 3 days  then as needed as directed 03/14/21   Rafael Bihari, [name]  sacubitril-valsartan (ENTRESTO) 97-103 MG Take 1 tablet by mouth 2 times daily. 01/19/21   Rafael Bihari, [name]  spironolactone (ALDACTONE) 25 MG tablet Take 1 tablet (25 mg total) by mouth daily. 02/13/21   Larey Dresser, MD  Tiotropium Bromide-Olodaterol (STIOLTO RESPIMAT) 2.5-2.5 MCG/ACT AERS Inhale 2 puffs into the lungs daily.    [provider]      Allergies    Lisinopril    Review of Systems   Review of Systems  Gastrointestinal:  Positive for abdominal pain.    Physical Exam Updated Vital Signs BP 121/78   Pulse 85   Temp 97.9 F (36.6 C)  (Oral)   Resp 18   SpO2 92%  Physical Exam  ED Results / Procedures / Treatments   Labs (all labs ordered are listed, but only abnormal results are displayed) Labs Reviewed  CBC WITH DIFFERENTIAL/PLATELET - Abnormal; Notable for the following components:      Result Value   MCV 105.6 (*)    MCH 36.7 (*)    Neutro Abs 7.8 (*)    All other components within normal limits  COMPREHENSIVE METABOLIC PANEL - Abnormal; Notable for the following components:   Glucose, Bld 125 (*)    BUN 35 (*)    Creatinine, Ser 2.28 (*)    Albumin 3.0 (*)    Alkaline Phosphatase 133 (*)    GFR, Estimated 31 (*)    All other components within normal limits  LIPASE, BLOOD - Abnormal; Notable for the following components:   Lipase 312 (*)    All other components within normal limits  URINALYSIS, ROUTINE W REFLEX MICROSCOPIC - Abnormal; Notable for the following components:   Color, Urine AMBER (*)    Glucose, UA >=500 (*)    Protein, ur >=300 (*)    Bacteria, UA RARE (*)    All other components within normal limits  CBG MONITORING, ED    EKG None  Radiology CT ABDOMEN PELVIS WO CONTRAST  Result Date: 04/19/2022 CLINICAL DATA:  Left lower quadrant abdominal pain, nausea and vomiting. Diarrhea. History of pancreatitis. One week history of abdominal pain and diarrhea. EXAM: CT ABDOMEN AND PELVIS WITHOUT CONTRAST TECHNIQUE: Multidetector CT imaging of the abdomen and pelvis was performed following the standard protocol without IV contrast. RADIATION DOSE REDUCTION: This exam was performed according to the departmental dose-optimization program which includes automated exposure control, adjustment of the mA and/or kV according to patient size and/or use of iterative reconstruction technique. COMPARISON:  11/12/2021 FINDINGS: Lower chest: Atelectasis or infiltration demonstrated in both lung bases. Changes could represent pneumonia or aspiration. Cardiac enlargement with small pericardial effusion.  Hepatobiliary: Unenhanced appearance is unremarkable. Pancreas: Characterization is limited without IV contrast material but there is fullness in the head and uncinate process of the pancreas with a 3.2 cm diameter collection in the region of the head of the pancreas, increasing from previous study. This most likely represents pancreatitis with an increasing size of pseudo cyst. However, abscess or mass lesion are not excluded and follow-up after resolution of acute process is recommended. No pancreatic ductal dilatation. Soft tissue stranding around the head of the pancreas with loss of fat planes to the duodenal and superior mesenteric vessels. Spleen: Normal in size without focal abnormality. Adrenals/Urinary Tract: 2.4 cm diameter left adrenal gland nodule, 1 Hounsfield unit, unchanged since prior study. No imaging follow-up is indicated. Kidneys are normal, without renal calculi, focal  lesion, or hydronephrosis. Bladder is unremarkable. Stomach/Bowel: Stomach, small bowel, and colon are not abnormally distended. No wall thickening or inflammatory changes. Small periumbilical hernia containing mostly fat but a small bit of small-bowel is also herniated. No proximal obstruction. Appendix is normal. Vascular/Lymphatic: Aortic atherosclerosis. No enlarged abdominal or pelvic lymph nodes. Reproductive: Prostate is unremarkable. Other: No free air or free fluid in the abdomen. Musculoskeletal: Areas of bone sclerosis and lucency with cortical thickening demonstrated in the right pelvis and right hip with less prominent involvement of the sacrum and T12 vertebra. Appearances are similar to prior studies. Changes are likely to represent Paget's disease. IMPRESSION: 1. Infiltrates in the lung bases, possibly pneumonia, atelectasis, or aspiration. 2. Fullness in the head and uncinate process of the pancreas with loss of fat planes and enlarging fluid collection since previous study. Changes may represent acute  pancreatitis with increasing size of pseudocyst but an underlying pancreatic neoplasm should be excluded and follow-up after resolution of acute process is recommended. 3. Stable appearance of left adrenal gland adenoma. No imaging follow-up is indicated. 4. 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 No change. 5. Aortic atherosclerosis. Electronically Signed   By: Lucienne Capers M.D.   On: 04/19/2022 02:18    Procedures Procedures  {Document cardiac monitor, telemetry assessment procedure when appropriate:1}  Medications Ordered in ED Medications - No data to display  ED Course/ Medical Decision Making/ A&P                           Medical Decision Making  ***  {Document critical care time when appropriate:1} {Document review of labs and clinical decision tools ie heart score, Chads2Vasc2 etc:1}  {Document your independent review of radiology images, and any outside records:1} {Document your discussion with family members, caretakers, and with consultants:1} {Document social determinants of health affecting pt's care:1} {Document your decision making why or why not admission, treatments were needed:1} Final Clinical Impression(s) / ED Diagnoses Final diagnoses:  None    Rx / DC Orders ED Discharge Orders     None

## 2022-04-19 NOTE — Assessment & Plan Note (Addendum)
Currently diet controlled, metformin dc'd in 4/23.  A1c 5.0.

## 2022-04-19 NOTE — Assessment & Plan Note (Addendum)
Stable.  Abdominal pain improving.  Tolerating p.o. intake.  Lipid panel without severe hypertriglyceridemia.  RUQ ultrasound without gallstones. -Encourage p.o. hydration, consider IV fluids if needed (currently holding off on aggressive fluids due to history of CHF) -Scheduled Tylenol, as needed oral Dilaudid for breakthrough pain -Advance diet as tolerated -May need repeat imaging outpatient to rule out underlying pancreatic neoplasm, consider inpatient surgery consult for possible MRCP -PRN miralax, zofran

## 2022-04-19 NOTE — ED Notes (Signed)
Sats noted to be 84% RA; pt encouraged to take deep breaths, sats up to 89,90%; pt denies sob; pt placed on 2L [place], sats now 97%; PA [place] notified

## 2022-04-19 NOTE — Assessment & Plan Note (Addendum)
Echo in 2022 with EF 55 - 60%.  Euvolemic on exam. -Continue home Coreg, Jardiance, Entresto, Aldactone

## 2022-04-19 NOTE — Assessment & Plan Note (Addendum)
Concern for pneumonia given CT findings.  Patient has a history of sarcoidosis and COPD, may also be contributing but exam is less concerning for acute flare.  Has been able to wean oxygen.  COVID/flu negative. -Continue home Brovana, Atrovent, Incruse Ellipta, PRN albuterol -Supplemental O2 as needed, please wean as tolerated, goal O2 saturation of 88 to 92% given history of COPD -Continue empiric azithromycin and ceftriaxone, may consider DC if clinically improving and able to wean oxygen -Incentive spirometry -Ambulate with pulse ox prior to discharge

## 2022-04-19 NOTE — Assessment & Plan Note (Signed)
Stable.  Creatinine 2.28 on admission, baseline unclear given lack of recent data, most recent CR 2.03 in 5/23. -Monitor BMP

## 2022-04-19 NOTE — Progress Notes (Signed)
FMTS Brief Progress Note  S: In to see patient for evening rounds. He was sleeping comfortably. Briefly woke patient and he denies current complaints. Endorses cough over the past several days but no shortness of breath, chest pain or fever. No abdominal pain currently.  O: BP (!) 148/97 (BP Location: Left Arm)   Pulse 62   Temp 97.9 F (36.6 C) (Oral)   Resp (!) 21   SpO2 98%   Gen: alert, NAD Resp: normal effort, lungs CTAB CV: RRR, normal S1/S2 without m/r/g Abd: soft, nontender, nondistended  A/P: Acute Hypoxemic Respiratory Failure -Treating for possible CAP, continue ceftriaxone and azithro for now -May also be related to underlying COPD -SpO2 100% on 3L-- weaned to 1L during my exam and maintained sats -Continue to wean supplemental O2 as tolerated, goal SpO2 88-92% -Ambulatory O2 sats tomorrow am  - Orders reviewed. Labs for AM ordered, which was adjusted as needed.   Remainder per H&P   Alcus Dad, MD 04/19/2022, 9:35 PM PGY-3, [facility] Night Resident  Please page [phone] with questions.

## 2022-04-20 ENCOUNTER — Other Ambulatory Visit (HOSPITAL_COMMUNITY): Payer: Self-pay

## 2022-04-20 ENCOUNTER — Other Ambulatory Visit: Payer: Self-pay

## 2022-04-20 LAB — CBC
HCT: 40 % (ref 39.0–52.0)
Hemoglobin: 13.5 g/dL (ref 13.0–17.0)
MCH: 36.5 pg — ABNORMAL HIGH (ref 26.0–34.0)
MCHC: 33.8 g/dL (ref 30.0–36.0)
MCV: 108.1 fL — ABNORMAL HIGH (ref 80.0–100.0)
Platelets: 165 10*3/uL (ref 150–400)
RBC: 3.7 MIL/uL — ABNORMAL LOW (ref 4.22–5.81)
RDW: 12.2 % (ref 11.5–15.5)
WBC: 7.4 10*3/uL (ref 4.0–10.5)
nRBC: 0 % (ref 0.0–0.2)

## 2022-04-20 LAB — HEMOGLOBIN A1C
Hgb A1c MFr Bld: 5 % (ref 4.8–5.6)
Mean Plasma Glucose: 96.8 mg/dL

## 2022-04-20 LAB — LIPID PANEL
Cholesterol: 101 mg/dL (ref 0–200)
HDL: 22 mg/dL — ABNORMAL LOW (ref >40)
LDL Cholesterol: 31 mg/dL (ref 0–99)
Total CHOL/HDL Ratio: 4.6 RATIO
Triglycerides: 242 mg/dL — ABNORMAL HIGH (ref <150)
VLDL: 48 mg/dL — ABNORMAL HIGH (ref 0–40)

## 2022-04-20 LAB — COMPREHENSIVE METABOLIC PANEL
ALT: 19 U/L (ref 0–44)
AST: 22 U/L (ref 15–41)
Albumin: 2.3 g/dL — ABNORMAL LOW (ref 3.5–5.0)
Alkaline Phosphatase: 112 U/L (ref 38–126)
Anion gap: 6 (ref 5–15)
BUN: 37 mg/dL — ABNORMAL HIGH (ref 8–23)
CO2: 27 mmol/L (ref 22–32)
Calcium: 9.3 mg/dL (ref 8.9–10.3)
Chloride: 106 mmol/L (ref 98–111)
Creatinine, Ser: 2.15 mg/dL — ABNORMAL HIGH (ref 0.61–1.24)
GFR, Estimated: 33 mL/min — ABNORMAL LOW (ref >60)
Glucose, Bld: 155 mg/dL — ABNORMAL HIGH (ref 70–99)
Potassium: 4.3 mmol/L (ref 3.5–5.1)
Sodium: 139 mmol/L (ref 135–145)
Total Bilirubin: 0.3 mg/dL (ref 0.3–1.2)
Total Protein: 5.3 g/dL — ABNORMAL LOW (ref 6.5–8.1)

## 2022-04-20 MED ORDER — CEFADROXIL 500 MG PO CAPS
500.0000 mg | ORAL_CAPSULE | Freq: Two times a day (BID) | ORAL | 0 refills | Status: AC
  Filled 2022-04-20: qty 8, 4d supply, fill #0

## 2022-04-20 MED ORDER — AZITHROMYCIN 500 MG PO TABS
500.0000 mg | ORAL_TABLET | Freq: Every day | ORAL | 0 refills | Status: AC
  Filled 2022-04-20: qty 2, 2d supply, fill #0

## 2022-04-20 MED ORDER — PNEUMOCOCCAL 20-VAL CONJ VACC 0.5 ML IM SUSY
0.5000 mL | PREFILLED_SYRINGE | INTRAMUSCULAR | Status: AC
  Administered 2022-04-20: 0.5 mL via INTRAMUSCULAR
  Filled 2022-04-20: qty 0.5

## 2022-04-20 MED ORDER — INFLUENZA VAC A&B SA ADJ QUAD 0.5 ML IM PRSY
0.5000 mL | PREFILLED_SYRINGE | INTRAMUSCULAR | Status: AC
  Administered 2022-04-20: 0.5 mL via INTRAMUSCULAR
  Filled 2022-04-20: qty 0.5

## 2022-04-20 MED ORDER — AZITHROMYCIN 500 MG PO TABS
500.0000 mg | ORAL_TABLET | Freq: Every day | ORAL | Status: DC
  Administered 2022-04-20: 500 mg via ORAL
  Filled 2022-04-20: qty 1

## 2022-04-20 MED ORDER — CEFADROXIL 500 MG PO CAPS
500.0000 mg | ORAL_CAPSULE | Freq: Two times a day (BID) | ORAL | Status: DC
  Administered 2022-04-20: 500 mg via ORAL
  Filled 2022-04-20: qty 1

## 2022-04-20 NOTE — Plan of Care (Signed)
°  Problem: Education: °Goal: Knowledge of General Education information will improve °Description: Including pain rating scale, medication(s)/side effects and non-pharmacologic comfort measures °Outcome: Progressing °  °Problem: Clinical Measurements: °Goal: Ability to maintain clinical measurements within normal limits will improve °Outcome: Progressing °  °Problem: Activity: °Goal: Risk for activity intolerance will decrease °Outcome: Progressing °  °Problem: Nutrition: °Goal: Adequate nutrition will be maintained °Outcome: Progressing °  °Problem: Coping: °Goal: Level of anxiety will decrease °Outcome: Progressing °  °Problem: Pain Managment: °Goal: General experience of comfort will improve °Outcome: Progressing °  °

## 2022-04-20 NOTE — Progress Notes (Signed)
SATURATION QUALIFICATIONS: (This note is used to comply with regulatory documentation for home oxygen)  Patient Saturations on Room Air at Rest = 98%  Patient Saturations on Room Air while Ambulating = 90%  Please briefly explain why patient needs home oxygen: Patient does not meet qualifications for home oxygen

## 2022-04-20 NOTE — Discharge Summary (Addendum)
[facility] Discharge Summary  Patient name: Charles Krueger Medical record number: [id number] Date of birth: 04-Jan-1956 Age: 66 y.o. Gender: male Date of Admission: 04/18/2022  Date of Discharge: 04/20/22 Admitting Physician: August Albino, MD  Primary Care Provider: [facility] Consultants: None  Indication for Hospitalization: hypoxemia  Discharge Diagnoses/Problem List:  Principal Problem for Admission: hypoxemia Other Problems addressed during stay:  Principal Problem:   Hypoxemia Active Problems:   Acute pancreatitis   Diabetes mellitus ([place])   Stage 3b chronic kidney disease ([place])   Chronic combined systolic and diastolic heart failure [facility]   Brief Hospital Course:  Charles Krueger is a 66 y.o.male with a history of pancreatitis, sarcoidosis, CHF, COPD, HTN, T2DM, CAD, CKD 3B who was admitted to the family medicine teaching Service at [facility] for acute pancreatitis and hypoxemia. His hospital course is detailed below:   Hypoxemia, ?CAP Presented with desaturations in the ED to mid 80s.  Improved with low-flow nasal cannula.  Had a recent history of cough, CT abdomen/pelvis was performed (due to abdominal pain) and showed evidence of possible pneumonia.  Remained afebrile without leukocytosis during his stay.  Was started on empiric azithromycin and ceftriaxone IV.  On discharge he was sent home with p.o. azithromycin for total course of 3 days and cefadroxil for total course of 5 days. Blood cx NGTD, COVID/Flu neg. Was successfully weaned to room air prior to discharge.  Acute pancreatitis Presented with recurrent abdominal pain, has a history of pancreatitis.  Follows with GI outpatient, sees the [place] [facility].  CT abdomen/pelvis with evidence of acute pancreatitis.  Lipase 312.  However, his presentation was mild and his abdominal pain significantly improved.  Suspect may be related to chronic ethanol use.  Lipid panel without  significant hypertriglyceridemia.  RUQ ultrasound without gallstones.  He was tolerating p.o. and the decision was made to hold off on IV fluids given his history of CHF and adequate p.o. hydration.  Patient's abdominal pain resolved by the time of discharge.  Recommend follow-up with GI outpatient for his known pancreatitis.  CT unable to rule out possible underlying neoplasm, may benefit from MRCP.  CHF, chronic Continued on home medications - Coreg, Jardiance, Entresto, Aldactone  History of diabetes A1c 5.0.  Metformin was DC'd in 4/23.  Did not require medication during his stay.  Other chronic conditions were medically managed with home medications and formulary alternatives as necessary   Issues for follow up: See CT below - recommend f/u of pancreatic pseudocyst vs neoplasm (follows w/ GI) Adrenal adenoma noted, no imaging f/u indicated Ensure completion of PO antibiotic course outpatient   PERTINENT IMAGING  CT ABD/PELVIS IMPRESSION: 1. Infiltrates in the lung bases, possibly pneumonia, atelectasis, or aspiration. 2. Fullness in the head and uncinate process of the pancreas with loss of fat planes and enlarging fluid collection since previous study. Changes may represent acute pancreatitis with increasing size of pseudocyst but an underlying pancreatic neoplasm should be excluded and follow-up after resolution of acute process is recommended. 3. Stable appearance of left adrenal gland adenoma. No imaging follow-up is indicated. 4. 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 No change. 5. Aortic atherosclerosis.   RUQ US IMPRESSION: 1. Continued findings of acute complicated pancreatitis. Please see CT discussion from earlier today. 2. Otherwise unremarkable right upper quadrant ultrasound.  Disposition: home  Discharge Condition: stable, improved   Discharge Exam:  Vitals:   04/20/22 0744 04/20/22 1020  BP: Marland Kitchen)  147/90 132/80   Pulse:  70  Resp: 18   Temp: 98.5 F (36.9 C)   SpO2:  94%   Gen: NAD, alert, conversational CV: RRR no MRG Pulm: CTAB normal WOB on RA, no crackles or wheezes Abd: Soft, NT/ND  Significant Procedures: N/A  Significant Labs and Imaging:  Recent Labs  Lab 04/18/22 1805 04/20/22 0437  WBC 10.5 7.4  HGB 15.8 13.5  HCT 45.4 40.0  PLT 209 165   Recent Labs  Lab 04/18/22 1805 04/20/22 0437  NA 138 139  K 4.6 4.3  CL 102 106  CO2 26 27  GLUCOSE 125* 155*  BUN 35* 37*  CREATININE 2.28* 2.15*  CALCIUM 10.2 9.3  ALKPHOS 133* 112  AST 28 22  ALT 13 19  ALBUMIN 3.0* 2.3*   Lipase 312  Results/Tests Pending at Time of Discharge: Final blood culture  Discharge Medications:  Allergies as of 04/20/2022       Reactions   Zestril [lisinopril] Other (See Comments)   Acute renal failure        Medication List     STOP taking these medications    metFORMIN 1000 MG tablet Commonly known as: GLUCOPHAGE       TAKE these medications    albuterol 108 (90 Base) MCG/ACT inhaler Commonly known as: VENTOLIN HFA Inhale 2 puffs into the lungs every 6 (six) hours as needed for wheezing or shortness of breath.   allopurinol 100 MG tablet Commonly known as: ZYLOPRIM Take 50 mg by mouth daily.   aspirin 81 MG tablet Take 162 mg by mouth at bedtime.   atorvastatin 40 MG tablet Commonly known as: LIPITOR Take 1 tablet (40 mg total) by mouth daily.   azithromycin 500 MG tablet Commonly known as: ZITHROMAX Take 1 tablet (500 mg total) by mouth daily for 2 days.   carvedilol 25 MG tablet Commonly known as: COREG Take 25 mg by mouth 2 (two) times daily with a meal.   cefadroxil 500 MG capsule Commonly known as: DURICEF Take 1 capsule (500 mg total) by mouth 2 (two) times daily for 4 days.   empagliflozin 10 MG Tabs tablet Commonly known as: JARDIANCE Take 1 tablet (10 mg total) by mouth daily.   Entresto 97-103 MG Generic drug: sacubitril-valsartan Take 1  tablet by mouth 2 times daily.   Fish Oil 1000 MG Caps Take 1,000 mg by mouth daily.   fluticasone 50 MCG/ACT nasal spray Commonly known as: FLONASE Place 2 sprays into both nostrils daily.   furosemide 20 MG tablet Commonly known as: LASIX Take 1 tablet (20 mg total) by mouth daily.   hydrALAZINE 50 MG tablet Commonly known as: APRESOLINE Take 1.5 tablets (75 mg total) by mouth 3 (three) times daily. What changed: how much to take   isosorbide mononitrate 30 MG 24 hr tablet Commonly known as: IMDUR Take 1 tablet (30 mg total) by mouth daily.   ketotifen 0.025 % ophthalmic solution Commonly known as: ZADITOR Place 1 drop into both eyes 2 (two) times daily as needed (itchy eyes).   montelukast 10 MG tablet Commonly known as: SINGULAIR Take 10 mg by mouth daily.   multivitamin with minerals Tabs tablet Take 1 tablet by mouth daily.   OXYGEN Inhale 2 L into the lungs as needed.   spironolactone 25 MG tablet Commonly known as: ALDACTONE Take 1 tablet (25 mg total) by mouth daily.   Stiolto Respimat 2.5-2.5 MCG/ACT Aers Generic drug: Tiotropium Bromide-Olodaterol Inhale 2 puffs into  the lungs daily.        Discharge Instructions: Please refer to Patient Instructions section of EMR for full details.  Patient was counseled important signs and symptoms that should prompt return to medical care, changes in medications, dietary instructions, activity restrictions, and follow up appointments.   Follow-Up Appointments:   Vonna Drafts, MD 04/20/2022, 12:35 PM PGY-1, [facility]  Upper Level Addendum:  I have seen and evaluated this patient along with Dr. Barb Merino and reviewed the above note, making necessary revisions as appropriate.  I agree with the medical decision making and physical exam as noted above.  Levin Erp, MD PGY-2 [facility] Family [facility] Residency

## 2022-04-24 LAB — CULTURE, BLOOD (ROUTINE X 2)
Culture: NO GROWTH
Culture: NO GROWTH
Special Requests: ADEQUATE

## 2022-05-24 NOTE — ED Provider Notes (Signed)
[facility] Provider Note   CSN: [id number] Arrival date & time: 04/18/22  1542     History  Chief Complaint  Patient presents with   Abdominal Pain    Charles Krueger is a 66 y.o. male.  Charles Krueger is a 66 y.o. male with a history of diabetes, hypertension, sleep apnea, pancreatitis, alcohol use, who presents emergency department for evaluation of abdominal pain.  Patient reports he has been experiencing abdominal pain for about a week.  He describes it as being in the center of his abdomen and describes it as a soreness that feels like a band wrapping around his abdomen sometimes.  He reports occasional diarrhea.  No associated fevers, chills, vomiting, constipation or blood in the stool.  He does report an occasional cough but no shortness of breath or chest pain.  Reports a history of pancreatitis although felt like this pain was not as severe as pancreatitis he has had previously but it has persisted and gotten worse rather than improving.  Has not drink any alcohol and 3 weeks.  The history is provided by the patient, the spouse and medical records.  Abdominal Pain Associated symptoms: cough   Associated symptoms: no chest pain, no chills, no constipation, no diarrhea, no fever, no nausea, no shortness of breath and no vomiting        Home Medications Prior to Admission medications   Medication Sig Start Date End Date Taking? Authorizing Provider  albuterol (VENTOLIN HFA) 108 (90 Base) MCG/ACT inhaler Inhale 2 puffs into the lungs every 6 (six) hours as needed for wheezing or shortness of breath.   Yes [provider]  allopurinol (ZYLOPRIM) 100 MG tablet Take 50 mg by mouth daily.   Yes [provider]  aspirin 81 MG tablet Take 162 mg by mouth at bedtime.   Yes [provider]  atorvastatin (LIPITOR) 40 MG tablet Take 1 tablet (40 mg total) by mouth daily. 01/09/21  Yes [name], NP  empagliflozin  (JARDIANCE) 10 MG TABS tablet Take 1 tablet (10 mg total) by mouth daily. 12/23/20  Yes [name], MD  fluticasone (FLONASE) 50 MCG/ACT nasal spray Place 2 sprays into both nostrils daily.   Yes [provider]  hydrALAZINE (APRESOLINE) 50 MG tablet Take 1.5 tablets (75 mg total) by mouth 3 (three) times daily. Patient taking differently: Take 50 mg by mouth 3 (three) times daily. 05/12/21  Yes [name], MD  isosorbide mononitrate (IMDUR) 30 MG 24 hr tablet Take 1 tablet (30 mg total) by mouth daily. 03/14/21  Yes [name], [name], [name]  ketotifen (ZADITOR) 0.025 % ophthalmic solution Place 1 drop into both eyes 2 (two) times daily as needed (itchy eyes).   Yes [provider]  montelukast (SINGULAIR) 10 MG tablet Take 10 mg by mouth daily.   Yes [provider]  Multiple Vitamin (MULTIVITAMIN WITH MINERALS) TABS tablet Take 1 tablet by mouth daily.   Yes [provider]  Omega-3 Fatty Acids (FISH OIL) 1000 MG CAPS Take 1,000 mg by mouth daily.   Yes [provider]  sacubitril-valsartan (ENTRESTO) 97-103 MG Take 1 tablet by mouth 2 times daily. 01/19/21  Yes [name], [name], [name]  spironolactone (ALDACTONE) 25 MG tablet Take 1 tablet (25 mg total) by mouth daily. 02/13/21  Yes [name], MD  Tiotropium Bromide-Olodaterol (STIOLTO RESPIMAT) 2.5-2.5 MCG/ACT AERS Inhale 2 puffs into the lungs daily.   Yes [provider]  carvedilol (COREG) 25 MG tablet Take 25 mg by mouth 2 (two) times daily with a meal.    [provider]  furosemide (LASIX) 20 MG tablet Take 1 tablet (20 mg total) by mouth daily. Patient not taking: Reported on 04/20/2022 06/13/21   [name], MD  OXYGEN Inhale 2 L into the lungs as needed.    [provider]      Allergies    Zestril [lisinopril]    Review of Systems   Review of Systems  Constitutional:  Negative for chills and fever.  Respiratory:  Positive for cough.  Negative for shortness of breath.   Cardiovascular:  Negative for chest pain.  Gastrointestinal:  Positive for abdominal pain. Negative for blood in stool, constipation, diarrhea, nausea and vomiting.  All other systems reviewed and are negative.   Physical Exam Updated Vital Signs BP 132/80   Pulse 70   Temp 98.5 F (36.9 C)   Resp 18   Ht 5\' 8"  (1.727 m)   Wt 81.4 kg   SpO2 94%   BMI 27.29 kg/m  Physical Exam Vitals and nursing note reviewed.  Constitutional:      General: He is not in acute distress.    Appearance: Normal appearance. He is well-developed. He is not ill-appearing or diaphoretic.  HENT:     Head: Normocephalic and atraumatic.  Eyes:     General:        Right eye: No discharge.        Left eye: No discharge.     Pupils: Pupils are equal, round, and reactive to light.  Cardiovascular:     Rate and Rhythm: Normal rate and regular rhythm.     Pulses: Normal pulses.     Heart sounds: Normal heart sounds.  Pulmonary:     Effort: Pulmonary effort is normal. No respiratory distress.     Breath sounds: Normal breath sounds. No wheezing or rales.     Comments: Respirations equal and unlabored, patient able to speak in full sentences, lungs clear to auscultation bilaterally  Abdominal:     General: Bowel sounds are normal. There is no distension.     Palpations: Abdomen is soft. There is no mass.     Tenderness: There is abdominal tenderness in the epigastric area and periumbilical area. There is no guarding.     Comments: Abdomen soft, nondistended, there is periumbilical and epigastric tenderness present, no skin changes, no guarding or rebound tenderness  Musculoskeletal:        General: No deformity.     Cervical back: Neck supple.  Skin:    General: Skin is warm and dry.     Capillary Refill: Capillary refill takes less than 2 seconds.  Neurological:     Mental Status: He is alert and oriented to person, place, and time.     Coordination: Coordination  normal.     Comments: Speech is clear, able to follow commands CN III-XII intact Normal strength in upper and lower extremities bilaterally including dorsiflexion and plantar flexion, strong and equal grip strength Sensation normal to light and sharp touch Moves extremities without ataxia, coordination intact  Psychiatric:        Mood and Affect: Mood normal.        Behavior: Behavior normal.     ED Results / Procedures / Treatments   Labs (all labs ordered are listed, but only abnormal results are displayed) Labs Reviewed  CBC WITH DIFFERENTIAL/PLATELET - Abnormal; Notable for the following  components:      Result Value   MCV 105.6 (*)    MCH 36.7 (*)    Neutro Abs 7.8 (*)    All other components within normal limits  COMPREHENSIVE METABOLIC PANEL - Abnormal; Notable for the following components:   Glucose, Bld 125 (*)    BUN 35 (*)    Creatinine, Ser 2.28 (*)    Albumin 3.0 (*)    Alkaline Phosphatase 133 (*)    GFR, Estimated 31 (*)    All other components within normal limits  LIPASE, BLOOD - Abnormal; Notable for the following components:   Lipase 312 (*)    All other components within normal limits  URINALYSIS, ROUTINE W REFLEX MICROSCOPIC - Abnormal; Notable for the following components:   Color, Urine AMBER (*)    Glucose, UA >=500 (*)    Protein, ur >=300 (*)    Bacteria, UA RARE (*)    All other components within normal limits  LIPID PANEL - Abnormal; Notable for the following components:   Triglycerides 242 (*)    HDL 22 (*)    VLDL 48 (*)    All other components within normal limits  COMPREHENSIVE METABOLIC PANEL - Abnormal; Notable for the following components:   Glucose, Bld 155 (*)    BUN 37 (*)    Creatinine, Ser 2.15 (*)    Total Protein 5.3 (*)    Albumin 2.3 (*)    GFR, Estimated 33 (*)    All other components within normal limits  CBC - Abnormal; Notable for the following components:   RBC 3.70 (*)    MCV 108.1 (*)    MCH 36.5 (*)    All  other components within normal limits  CULTURE, BLOOD (ROUTINE X 2)  CULTURE, BLOOD (ROUTINE X 2)  RESP PANEL BY RT-PCR (FLU A&B, COVID) ARPGX2  HIV ANTIBODY (ROUTINE TESTING W REFLEX)  HEMOGLOBIN A1C  CBG MONITORING, ED    EKG None  Radiology  CT ABDOMEN PELVIS WO CONTRAST  Result Date: 04/19/2022 CLINICAL DATA:  Left lower quadrant abdominal pain, nausea and vomiting. Diarrhea. History of pancreatitis. One week history of abdominal pain and diarrhea. EXAM: CT ABDOMEN AND PELVIS WITHOUT CONTRAST TECHNIQUE: Multidetector CT imaging of the abdomen and pelvis was performed following the standard protocol without IV contrast. RADIATION DOSE REDUCTION: This exam was performed according to the departmental dose-optimization program which includes automated exposure control, adjustment of the mA and/or kV according to patient size and/or use of iterative reconstruction technique. COMPARISON:  11/12/2021 FINDINGS: Lower chest: Atelectasis or infiltration demonstrated in both lung bases. Changes could represent pneumonia or aspiration. Cardiac enlargement with small pericardial effusion. Hepatobiliary: Unenhanced appearance is unremarkable. Pancreas: Characterization is limited without IV contrast material but there is fullness in the head and uncinate process of the pancreas with a 3.2 cm diameter collection in the region of the head of the pancreas, increasing from previous study. This most likely represents pancreatitis with an increasing size of pseudo cyst. However, abscess or mass lesion are not excluded and follow-up after resolution of acute process is recommended. No pancreatic ductal dilatation. Soft tissue stranding around the head of the pancreas with loss of fat planes to the duodenal and superior mesenteric vessels. Spleen: Normal in size without focal abnormality. Adrenals/Urinary Tract: 2.4 cm diameter left adrenal gland nodule, 1 Hounsfield unit, unchanged since prior study. No imaging  follow-up is indicated. Kidneys are normal, without renal calculi, focal lesion, or hydronephrosis. Bladder is unremarkable. Stomach/Bowel:  Stomach, small bowel, and colon are not abnormally distended. No wall thickening or inflammatory changes. Small periumbilical hernia containing mostly fat but a small bit of small-bowel is also herniated. No proximal obstruction. Appendix is normal. Vascular/Lymphatic: Aortic atherosclerosis. No enlarged abdominal or pelvic lymph nodes. Reproductive: Prostate is unremarkable. Other: No free air or free fluid in the abdomen. Musculoskeletal: Areas of bone sclerosis and lucency with cortical thickening demonstrated in the right pelvis and right hip with less prominent involvement of the sacrum and T12 vertebra. Appearances are similar to prior studies. Changes are likely to represent Paget's disease. IMPRESSION: 1. Infiltrates in the lung bases, possibly pneumonia, atelectasis, or aspiration. 2. Fullness in the head and uncinate process of the pancreas with loss of fat planes and enlarging fluid collection since previous study. Changes may represent acute pancreatitis with increasing size of pseudocyst but an underlying pancreatic neoplasm should be excluded and follow-up after resolution of acute process is recommended. 3. Stable appearance of left adrenal gland adenoma. No imaging follow-up is indicated. 4. 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 No change. 5. Aortic atherosclerosis. Electronically Signed   By: Burman NievesWilliam  Stevens M.D.   On: 04/19/2022 02:18     Procedures Procedures    Medications Ordered in ED Medications  sodium chloride 0.9 % bolus 1,000 mL (0 mLs Intravenous Stopped 04/19/22 1408)  morphine (PF) 4 MG/ML injection 4 mg (4 mg Intravenous Given 04/19/22 1251)  ondansetron (ZOFRAN) injection 4 mg (4 mg Intravenous Given 04/19/22 1250)  cefTRIAXone (ROCEPHIN) 1 g in sodium chloride 0.9 % 100 mL IVPB (0 g Intravenous Stopped  04/19/22 1408)  azithromycin (ZITHROMAX) 500 mg in sodium chloride 0.9 % 250 mL IVPB (0 mg Intravenous Stopped 04/19/22 1512)  pneumococcal 20-valent conjugate vaccine (PREVNAR 20) injection 0.5 mL (0.5 mLs Intramuscular Given 04/20/22 1428)  influenza vaccine adjuvanted (FLUAD) injection 0.5 mL (0.5 mLs Intramuscular Given 04/20/22 1429)    ED Course/ Medical Decision Making/ A&P                           Medical Decision Making Amount and/or Complexity of Data Reviewed Labs: ordered.  Risk Prescription drug management. Decision regarding hospitalization.   66 y.o. male presents to the ED with complaints of abdominal pain, this involves an extensive number of treatment options, and is a complaint that carries with it a high risk of complications and morbidity.  The differential diagnosis includes pancreatitis, GERD, peptic ulcer disease, cholecystitis, bowel obstruction, diverticulitis, appendicitis, colitis  On arrival pt is nontoxic, vitals significant for mildly elevated blood pressure on arrival but otherwise normal.  Patient then noted to have a few episodes where her oxygen saturations dipping into the 80s.  Placed on 3 L nasal cannula with improvement  Additional history obtained from wife at bedside. Previous records obtained and reviewed   I ordered medication including IV fluids, pain and nausea medication and IV Rocephin and azithromycin for pneumonia  Lab Tests:  I Ordered, reviewed, and interpreted labs, which included: No leukocytosis and normal hemoglobin, creatinine of 2.28, previously 1.66, IV fluids.  No other significant electrolyte derangements, LFTs unremarkable, lipase 312  Imaging Studies ordered:  I ordered imaging studies which included CT abdomen pelvis, I independently visualized and interpreted imaging which showed infiltrates bilateral lung bases concerning for possible pneumonia, as well as fullness in the head and uncinate process of the pancreas with  loss of fat planes and enlarging fluid collection  since prior study concerning for acute pancreatitis with increasing size of pseudocyst  ED Course:    I consulted family medicine teaching service and discussed lab and imaging findings, they will see and admit patient for acute on chronic pancreatitis as well as pneumonia with acute hypoxic respiratory failure.  Fortunately at this time no signs of sepsis or systemic illness, patient with oxygen desaturations into the 80s and resolved with 3 L nasal cannula, no respiratory distress.    Portions of this note were generated with Scientist, clinical (histocompatibility and immunogenetics). Dictation errors may occur despite best attempts at proofreading.         Final Clinical Impression(s) / ED Diagnoses Final diagnoses:  Abdominal pain, unspecified abdominal location  Pneumonia due to infectious organism, unspecified laterality, unspecified part of lung    Rx / DC Orders ED Discharge Orders          Ordered    azithromycin (ZITHROMAX) 500 MG tablet  Daily        04/20/22 1037    cefadroxil (DURICEF) 500 MG capsule  2 times daily        04/20/22 1037              Dartha Lodge, [place] 05/24/22 1805    Mardene Sayer, MD 05/27/22 [phone]

## 2022-06-10 ENCOUNTER — Encounter (HOSPITAL_COMMUNITY): Payer: Self-pay | Admitting: Emergency Medicine

## 2022-06-10 ENCOUNTER — Other Ambulatory Visit: Payer: Self-pay

## 2022-06-10 ENCOUNTER — Ambulatory Visit (HOSPITAL_COMMUNITY)
Admission: EM | Admit: 2022-06-10 | Discharge: 2022-06-10 | Disposition: A | Discharge status: Home / Self Care | Payer: No Typology Code available for payment source | Attending: Family Medicine | Admitting: Family Medicine

## 2022-06-10 ENCOUNTER — Encounter (HOSPITAL_BASED_OUTPATIENT_CLINIC_OR_DEPARTMENT_OTHER): Payer: Self-pay

## 2022-06-10 DIAGNOSIS — K4031 Unilateral inguinal hernia, with obstruction, without gangrene, recurrent: Secondary | ICD-10-CM | POA: Diagnosis not present

## 2022-06-10 DIAGNOSIS — Z7982 Long term (current) use of aspirin: Secondary | ICD-10-CM | POA: Insufficient documentation

## 2022-06-10 DIAGNOSIS — R748 Abnormal levels of other serum enzymes: Secondary | ICD-10-CM | POA: Insufficient documentation

## 2022-06-10 DIAGNOSIS — I129 Hypertensive chronic kidney disease with stage 1 through stage 4 chronic kidney disease, or unspecified chronic kidney disease: Secondary | ICD-10-CM | POA: Insufficient documentation

## 2022-06-10 DIAGNOSIS — R1031 Right lower quadrant pain: Secondary | ICD-10-CM | POA: Diagnosis present

## 2022-06-10 DIAGNOSIS — Z79899 Other long term (current) drug therapy: Secondary | ICD-10-CM | POA: Diagnosis not present

## 2022-06-10 DIAGNOSIS — N189 Chronic kidney disease, unspecified: Secondary | ICD-10-CM | POA: Diagnosis not present

## 2022-06-10 DIAGNOSIS — E1122 Type 2 diabetes mellitus with diabetic chronic kidney disease: Secondary | ICD-10-CM | POA: Diagnosis not present

## 2022-06-10 DIAGNOSIS — K4090 Unilateral inguinal hernia, without obstruction or gangrene, not specified as recurrent: Secondary | ICD-10-CM | POA: Diagnosis not present

## 2022-06-10 LAB — URINALYSIS, ROUTINE W REFLEX MICROSCOPIC
Bilirubin Urine: NEGATIVE
Glucose, UA: 500 mg/dL — AB
Hgb urine dipstick: NEGATIVE
Ketones, ur: NEGATIVE mg/dL
Leukocytes,Ua: NEGATIVE
Nitrite: NEGATIVE
Protein, ur: 100 mg/dL — AB
Specific Gravity, Urine: 1.018 (ref 1.005–1.030)
pH: 6 (ref 5.0–8.0)

## 2022-06-10 LAB — CBC
HCT: 35.4 % — ABNORMAL LOW (ref 39.0–52.0)
Hemoglobin: 11.8 g/dL — ABNORMAL LOW (ref 13.0–17.0)
MCH: 34.9 pg — ABNORMAL HIGH (ref 26.0–34.0)
MCHC: 33.3 g/dL (ref 30.0–36.0)
MCV: 104.7 fL — ABNORMAL HIGH (ref 80.0–100.0)
Platelets: 266 10*3/uL (ref 150–400)
RBC: 3.38 MIL/uL — ABNORMAL LOW (ref 4.22–5.81)
RDW: 13.6 % (ref 11.5–15.5)
WBC: 6.9 10*3/uL (ref 4.0–10.5)
nRBC: 0 % (ref 0.0–0.2)

## 2022-06-10 LAB — COMPREHENSIVE METABOLIC PANEL
ALT: 13 U/L (ref 0–44)
AST: 12 U/L — ABNORMAL LOW (ref 15–41)
Albumin: 3.9 g/dL (ref 3.5–5.0)
Alkaline Phosphatase: 104 U/L (ref 38–126)
Anion gap: 6 (ref 5–15)
BUN: 21 mg/dL (ref 8–23)
CO2: 24 mmol/L (ref 22–32)
Calcium: 10.1 mg/dL (ref 8.9–10.3)
Chloride: 108 mmol/L (ref 98–111)
Creatinine, Ser: 1.78 mg/dL — ABNORMAL HIGH (ref 0.61–1.24)
GFR, Estimated: 42 mL/min — ABNORMAL LOW (ref >60)
Glucose, Bld: 141 mg/dL — ABNORMAL HIGH (ref 70–99)
Potassium: 4.3 mmol/L (ref 3.5–5.1)
Sodium: 138 mmol/L (ref 135–145)
Total Bilirubin: 0.2 mg/dL — ABNORMAL LOW (ref 0.3–1.2)
Total Protein: 6.8 g/dL (ref 6.5–8.1)

## 2022-06-10 LAB — LIPASE, BLOOD: Lipase: 158 U/L — ABNORMAL HIGH (ref 11–51)

## 2022-06-10 NOTE — ED Provider Notes (Signed)
[facility]    CSN: [id number] Arrival date & time: 06/10/22  1413      History   Chief Complaint Chief Complaint  Patient presents with   Groin Pain    HPI Charles Krueger is a 66 y.o. male.    Groin Pain   Here for right inguinal swelling and pain.  He has had this area in his right inguinal area gets swollen before, but usually reduces within a couple of hours  He has not had any vomiting or nausea or fever.  He had a very small bowel movement this morning but that is not like his usual bowel movement.  The swelling and pain began about 4 or 5 hours ago.  Past Medical History:  Diagnosis Date   Diabetes mellitus without complication (HCC)    HTN (hypertension)    OSA on CPAP     Patient Active Problem List   Diagnosis Date Noted   Acute pancreatitis 04/19/2022   Hypoxemia 04/19/2022   Chronic combined systolic and diastolic heart failure (HCC) 12/30/2020   Cirrhosis (HCC)    Dilated cardiomyopathy (HCC)    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 12/09/2020   Acute exacerbation of CHF (congestive heart failure) (HCC) 12/08/2020   Anemia 12/08/2020   Mediastinal lymphadenopathy 12/08/2020   COPD (chronic obstructive pulmonary disease) (HCC) 12/08/2020   OSA (obstructive sleep apnea) 12/08/2020   Diabetes mellitus (HCC) 12/08/2020   CAD (coronary artery disease) 12/08/2020   Hyperlipidemia 12/08/2020   Stage 3b chronic kidney disease (HCC) 10/13/2020   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 Authorizing Provider  albuterol (VENTOLIN HFA) 108 (90 Base)  MCG/ACT inhaler Inhale 2 puffs into the lungs every 6 (six) hours as needed for wheezing or shortness of breath.    [provider]  allopurinol (ZYLOPRIM) 100 MG tablet Take 50 mg by mouth daily.    [provider]  aspirin 81 MG tablet Take 162 mg by mouth at bedtime.    [provider]  atorvastatin (LIPITOR) 40 MG tablet Take 1 tablet (40 mg total) by mouth daily. 01/09/21   [name], NP  carvedilol (COREG) 25 MG tablet Take 25 mg by mouth 2 (two) times daily with a meal.    [provider]  empagliflozin (JARDIANCE) 10 MG TABS tablet Take 1 tablet (10 mg total) by mouth daily. 12/23/20   [name], MD  fluticasone (FLONASE) 50 MCG/ACT nasal spray Place 2 sprays into both nostrils daily.    [provider]  furosemide (LASIX) 20 MG tablet Take 1 tablet (20 mg total) by mouth daily. Patient not taking: Reported on 04/20/2022 06/13/21   [name], MD  hydrALAZINE (APRESOLINE) 50 MG tablet Take 1.5 tablets (75 mg total) by mouth 3 (three) times daily. Patient taking differently: Take 50 mg by mouth 3 (three) times daily. 05/12/21   [name], MD  isosorbide mononitrate (IMDUR) 30 MG 24 hr tablet Take 1 tablet (30 mg total) by mouth daily. 03/14/21   [name], [name]  ketotifen (ZADITOR) 0.025 % ophthalmic solution  Place 1 drop into both eyes 2 (two) times daily as needed (itchy eyes).    [provider]  montelukast (SINGULAIR) 10 MG tablet Take 10 mg by mouth daily.    [provider]  Multiple Vitamin (MULTIVITAMIN WITH MINERALS) TABS tablet Take 1 tablet by mouth daily.    [provider]  Omega-3 Fatty Acids (FISH OIL) 1000 MG CAPS Take 1,000 mg by mouth daily.    [provider]  OXYGEN Inhale 2 L into the lungs as needed.    [provider]  sacubitril-valsartan (ENTRESTO) 97-103 MG Take 1 tablet by mouth 2 times daily. 01/19/21   [name], [name]  spironolactone  (ALDACTONE) 25 MG tablet Take 1 tablet (25 mg total) by mouth daily. 02/13/21   [name], MD  Tiotropium Bromide-Olodaterol (STIOLTO RESPIMAT) 2.5-2.5 MCG/ACT AERS Inhale 2 puffs into the lungs daily.    [provider]    Family History Family History  Problem Relation Age of Onset   CAD Mother     Social History Social History   Tobacco Use   Smoking status: Some Days    Types: Cigars    Start date: 2002   Smokeless tobacco: Current   Tobacco comments:    3-4 Cigars per week  Vaping Use   Vaping Use: Never used  Substance Use Topics   Alcohol use: Yes    Alcohol/week: 6.0 - 7.0 standard drinks of alcohol    Types: 6 - 7 Shots of liquor per week    Comment: 6-7 shots per week   Drug use: No     Allergies   Zestril [lisinopril]   Review of Systems Review of Systems   Physical Exam Triage Vital Signs ED Triage Vitals  Enc Vitals Group     BP 06/10/22 1652 (!) 150/100     Pulse Rate 06/10/22 1652 77     Resp 06/10/22 1652 20     Temp 06/10/22 1652 98.4 F (36.9 C)     Temp Source 06/10/22 1652 Oral     SpO2 06/10/22 1652 100 %     Weight --      Height --      Head Circumference --      Peak Flow --      Pain Score 06/10/22 1648 6     Pain Loc --      Pain Edu? --      Excl. in GC? --    No data found.  Updated Vital Signs BP (!) 150/100 (BP Location: Left Arm)   Pulse 77   Temp 98.4 F (36.9 C) (Oral)   Resp 20   SpO2 100%   Visual Acuity Right Eye Distance:   Left Eye Distance:   Bilateral Distance:    Right Eye Near:   Left Eye Near:    Bilateral Near:     Physical Exam Vitals reviewed.  Constitutional:      Appearance: He is not ill-appearing, toxic-appearing or diaphoretic.     Comments: He is in some obvious discomfort in the exam room.  Cardiovascular:     Rate and Rhythm: Normal rate and regular rhythm.  Pulmonary:     Effort: Pulmonary effort is normal.     Breath sounds: Normal breath sounds.  Abdominal:      Hernia: A hernia (There is a hernia evident that is not reducible in the right inguinal area.  It is about 8 cm x 3 cm.  It  is tender) is present.     Comments: Abdomen is little protuberant.  Normoactive bowel sounds.  Skin:    Coloration: Skin is not pale.  Neurological:     General: No focal deficit present.     Mental Status: He is alert and oriented to person, place, and time.  Psychiatric:        Behavior: Behavior normal.      UC Treatments / Results  Labs (all labs ordered are listed, but only abnormal results are displayed) Labs Reviewed - No data to display  EKG   Radiology No results found.  Procedures Procedures (including critical care time)  Medications Ordered in UC Medications - No data to display  Initial Impression / Assessment and Plan / UC Course  I have reviewed the triage vital signs and the nursing notes.  Pertinent labs & imaging results that were available during my care of the patient were reviewed by me and considered in my medical decision making (see chart for details).        I have asked him to proceed to the emergency room for higher level of care and evaluation then we can provide here in the urgent care setting for this hernia that could be strangulated or obstructed Final Clinical Impressions(s) / UC Diagnoses   Final diagnoses:  Recurrent unilateral inguinal hernia with obstruction and without gangrene     Discharge Instructions      Please proceed to the emergency room for higher level of evaluation and care than we can provide here in the urgent care    ED Prescriptions   None    PDMP not reviewed this encounter.   Zenia Resides, MD 06/10/22 [phone]

## 2022-06-10 NOTE — ED Triage Notes (Addendum)
Patient has noticed a groin bulge that would resolve .    Patient reports todays event has been painful.  Patient reports bulge has resolved some while waiting  Patient reports small or no BM the past 2 days and had abdominal cramping

## 2022-06-10 NOTE — ED Triage Notes (Signed)
Pt states he went to urgent care and they told him he had a hernia on the right side of his lower abdomen. States he has had pain this past week and got worse Wednesday. States he has been having problems with bowel movements and getting some bloating with cramps.

## 2022-06-10 NOTE — Discharge Instructions (Addendum)
Please proceed to the emergency room for higher level of evaluation and care than we can provide here in the urgent care. 

## 2022-06-10 NOTE — ED Notes (Signed)
Patient is being discharged from the Urgent Care and sent to the Emergency Department via POV . Per Loreta Ave, MD, patient is in need of higher level of care due to higher level of care needed. Patient is aware and verbalizes understanding of plan of care.  Vitals:   06/10/22 1652  BP: (!) 150/100  Pulse: 77  Resp: 20  Temp: 98.4 F (36.9 C)  SpO2: 100%

## 2022-06-11 ENCOUNTER — Emergency Department (HOSPITAL_BASED_OUTPATIENT_CLINIC_OR_DEPARTMENT_OTHER): Payer: No Typology Code available for payment source

## 2022-06-11 ENCOUNTER — Other Ambulatory Visit (HOSPITAL_BASED_OUTPATIENT_CLINIC_OR_DEPARTMENT_OTHER): Payer: Self-pay

## 2022-06-11 ENCOUNTER — Emergency Department (HOSPITAL_BASED_OUTPATIENT_CLINIC_OR_DEPARTMENT_OTHER)
Admission: EM | Admit: 2022-06-11 | Discharge: 2022-06-11 | Disposition: A | Discharge status: Home / Self Care | Payer: No Typology Code available for payment source | Attending: Emergency Medicine | Admitting: Emergency Medicine

## 2022-06-11 ENCOUNTER — Encounter (HOSPITAL_BASED_OUTPATIENT_CLINIC_OR_DEPARTMENT_OTHER): Payer: Self-pay

## 2022-06-11 DIAGNOSIS — K4090 Unilateral inguinal hernia, without obstruction or gangrene, not specified as recurrent: Secondary | ICD-10-CM

## 2022-06-11 MED ORDER — SODIUM CHLORIDE 0.9 % IV BOLUS
1000.0000 mL | Freq: Once | INTRAVENOUS | Status: AC
  Administered 2022-06-11: 1000 mL via INTRAVENOUS

## 2022-06-11 MED ORDER — POLYETHYLENE GLYCOL 3350 17 GM/SCOOP PO POWD
17.0000 g | Freq: Every day | ORAL | 0 refills | Status: DC | PRN
  Filled 2022-06-11: qty 238, 14d supply, fill #0

## 2022-06-11 MED ORDER — IOHEXOL 300 MG/ML  SOLN
100.0000 mL | Freq: Once | INTRAMUSCULAR | Status: AC | PRN
  Administered 2022-06-11: 60 mL via INTRAVENOUS

## 2022-06-11 MED ORDER — DOCUSATE SODIUM 100 MG PO CAPS
100.0000 mg | ORAL_CAPSULE | Freq: Two times a day (BID) | ORAL | 0 refills | Status: DC
  Filled 2022-06-11: qty 100, 50d supply, fill #0

## 2022-06-11 NOTE — ED Provider Notes (Signed)
[facility] EMERGENCY DEPT Provider Note   CSN: [id number] Arrival date & time: 06/10/22  1825     History  Chief Complaint  Patient presents with   Abdominal Pain    Charles Krueger is a 66 y.o. male.  HPI 66 year old male with a history of diabetes, CKD, cardiomyopathy and hypertension presents with right hernia pain.  He states that about a week ago he first noticed a bulge in his right lower abdomen.  It was nonpainful at that time and then came back a few days later and was painful.  Went away and then came back yesterday morning and it was painful.  He went to urgent care who sent him to the ER.  Right now he is asymptomatic and feels like the bulge is gone.  No difficulty urinating, penile pain, scrotal pain, etc.  He has been constipated recently.  No blood in the stools.  Home Medications Prior to Admission medications   Medication Sig Start Date End Date Taking? Authorizing Provider  docusate sodium (COLACE) 100 MG capsule Take 1 capsule (100 mg total) by mouth every 12 (twelve) hours. 06/11/22  Yes Pricilla Loveless, MD  polyethylene glycol (MIRALAX / GLYCOLAX) 17 g packet Take 17 g by mouth daily as needed for mild constipation. 06/11/22  Yes Pricilla Loveless, MD  albuterol (VENTOLIN HFA) 108 (90 Base) MCG/ACT inhaler Inhale 2 puffs into the lungs every 6 (six) hours as needed for wheezing or shortness of breath.    [provider]  allopurinol (ZYLOPRIM) 100 MG tablet Take 50 mg by mouth daily.    [provider]  aspirin 81 MG tablet Take 162 mg by mouth at bedtime.    [provider]  atorvastatin (LIPITOR) 40 MG tablet Take 1 tablet (40 mg total) by mouth daily. 01/09/21   [name], NP  carvedilol (COREG) 25 MG tablet Take 25 mg by mouth 2 (two) times daily with a meal.    [provider]  empagliflozin (JARDIANCE) 10 MG TABS tablet Take 1 tablet (10 mg total) by mouth daily. 12/23/20   [name], MD   fluticasone (FLONASE) 50 MCG/ACT nasal spray Place 2 sprays into both nostrils daily.    [provider]  furosemide (LASIX) 20 MG tablet Take 1 tablet (20 mg total) by mouth daily. Patient not taking: Reported on 04/20/2022 06/13/21   [name], MD  hydrALAZINE (APRESOLINE) 50 MG tablet Take 1.5 tablets (75 mg total) by mouth 3 (three) times daily. Patient taking differently: Take 50 mg by mouth 3 (three) times daily. 05/12/21   [name], MD  isosorbide mononitrate (IMDUR) 30 MG 24 hr tablet Take 1 tablet (30 mg total) by mouth daily. 03/14/21   [name], [name], [name]  ketotifen (ZADITOR) 0.025 % ophthalmic solution Place 1 drop into both eyes 2 (two) times daily as needed (itchy eyes).    [provider]  montelukast (SINGULAIR) 10 MG tablet Take 10 mg by mouth daily.    [provider]  Multiple Vitamin (MULTIVITAMIN WITH MINERALS) TABS tablet Take 1 tablet by mouth daily.    [provider]  Omega-3 Fatty Acids (FISH OIL) 1000 MG CAPS Take 1,000 mg by mouth daily.    [provider]  OXYGEN Inhale 2 L into the lungs as needed.    [provider]  sacubitril-valsartan (ENTRESTO) 97-103 MG Take 1 tablet by mouth 2 times daily. 01/19/21   [name], [name]  spironolactone (ALDACTONE) 25  MG tablet Take 1 tablet (25 mg total) by mouth daily. 02/13/21   [name], MD  Tiotropium Bromide-Olodaterol (STIOLTO RESPIMAT) 2.5-2.5 MCG/ACT AERS Inhale 2 puffs into the lungs daily.    [provider]      Allergies    Zestril [lisinopril]    Review of Systems   Review of Systems  Gastrointestinal:  Positive for abdominal pain and constipation. Negative for blood in stool and vomiting.  Genitourinary:  Negative for difficulty urinating, dysuria, penile pain and scrotal swelling.    Physical Exam Updated Vital Signs BP (!) 156/80 (BP Location: Left Arm)   Pulse 64   Temp 98.4 F (36.9 C) (Oral)   Resp 20    Ht 5\' 9"  (1.753 m)   Wt 74.8 kg   SpO2 100%   BMI 24.37 kg/m  Physical Exam Vitals and nursing note reviewed.  Constitutional:      Appearance: He is well-developed.  HENT:     Head: Normocephalic and atraumatic.  Pulmonary:     Effort: Pulmonary effort is normal.  Abdominal:     Palpations: Abdomen is soft.     Tenderness: There is abdominal tenderness.    Skin:    General: Skin is warm and dry.  Neurological:     Mental Status: He is alert.     ED Results / Procedures / Treatments   Labs (all labs ordered are listed, but only abnormal results are displayed) Labs Reviewed  LIPASE, BLOOD - Abnormal; Notable for the following components:      Result Value   Lipase 158 (*)    All other components within normal limits  COMPREHENSIVE METABOLIC PANEL - Abnormal; Notable for the following components:   Glucose, Bld 141 (*)    Creatinine, Ser 1.78 (*)    AST 12 (*)    Total Bilirubin 0.2 (*)    GFR, Estimated 42 (*)    All other components within normal limits  CBC - Abnormal; Notable for the following components:   RBC 3.38 (*)    Hemoglobin 11.8 (*)    HCT 35.4 (*)    MCV 104.7 (*)    MCH 34.9 (*)    All other components within normal limits  URINALYSIS, ROUTINE W REFLEX MICROSCOPIC - Abnormal; Notable for the following components:   Glucose, UA 500 (*)    Protein, ur 100 (*)    Bacteria, UA RARE (*)    All other components within normal limits    EKG None  Radiology CT ABDOMEN PELVIS W CONTRAST  Result Date: 06/11/2022 CLINICAL DATA:  66 year old male with abdomen and right groin pain. Query hernia. EXAM: CT ABDOMEN AND PELVIS WITH CONTRAST TECHNIQUE: Multidetector CT imaging of the abdomen and pelvis was performed using the standard protocol following bolus administration of intravenous contrast. RADIATION DOSE REDUCTION: This exam was performed according to the departmental dose-optimization program which includes automated exposure control, adjustment of  the mA and/or kV according to patient size and/or use of iterative reconstruction technique. CONTRAST:  76mL OMNIPAQUE IOHEXOL 300 MG/ML  SOLN COMPARISON:  CT Abdomen and Pelvis 04/19/2022 without contrast. CT Abdomen and Pelvis with contrast 08/21/2006. FINDINGS: Lower chest: Mild cardiomegaly. No pericardial or pleural effusion. Platelike atelectasis or scarring in the lingula more so than the right middle lobe. Other nonspecific lung base mosaic attenuation and increased interstitial markings, possibly chronic lung disease. Hepatobiliary: Mild inflammation at the porta hepatis, see pancreas findings. Liver and gallbladder remain within normal limits. Pancreas: Chronically abnormal  pancreas. 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 Enlarged and indistinct pancreatic head and uncinate now with mild regional inflammation. Abnormal duodenum, see below. Only mild main pancreatic ductal dilatation now. No pancreatic necrosis. No discrete free fluid. Spleen: Negative. Adrenals/Urinary Tract: Bilateral adrenal gland thickening is chronic, present on the left side in 2008, consistent with benign etiology either hyperplasia or adenoma (no follow-up imaging recommended). Stable nonobstructed kidneys with no nephrolithiasis. However, little renal contrast excretion on the delayed images. No hydronephrosis. Unremarkable bladder. Stomach/Bowel: Large bowel retained stool. Fairly extensive diverticulosis of both the ascending colon and junction of the descending and sigmoid colon. No active inflammation identified. Normal appendix is visible on coronal image 56 located at the right inguinal ring, and the appendix does appear to track into a subtle right inguinal hernia. See coronal images 58-62. There is also trace fluid or stranding associated. But the appendix is nondilated. Terminal ileum and other small bowel loops are within normal limits. Stomach is decompressed. But the gastric antrum and  proximal duodenum appear chronically abnormal at the pancreatic head where chronic pseudocyst was present in 2008. There is mild inflammation in this region. See coronal image 61. The duodenum appears thickened with mucosal hyperenhancement and irregularity. But no free air or free fluid.  Distal duodenum appears negative. Vascular/Lymphatic: Extensive Aortoiliac calcified atherosclerosis. Suboptimal vascular contrast bolus but the major arterial structures appear to remain patent. Portal venous system appears patent. No lymphadenopathy identified. Reproductive: Bilateral scrotal hydroceles are visible on series 2, image 97, with simple fluid density on the right. Subtle right inguinal hernia detailed above. No convincing left inguinal hernia. Other: No pelvic free fluid. Musculoskeletal: Chronic appearing heterogeneous sclerosis in the sacrum, right hemipelvis, proximal right femur. Favor Paget's disease. No acute or destructive osseous lesion identified. IMPRESSION: 1. Positive for a subtle Right Inguinal Hernia which Contains The Appendix. Although trace fluid or inflammatory stranding within the hernia sac, the appendix itself seems to remain normal. 2. Positive also for chronically abnormal pancreas: Enlarged and indistinct pancreatic head and uncinate with mild regional inflammation, and abnormal adjacent duodenum (Duodenitis or Peptic Ulcer Disease). Suspect recurrent Pancreatitis (query abnormal lipase), but underlying pancreatic mass is difficult to exclude. Follow-up Pancreas protocol Abdomen MRI (without and with contrast) is recommended as an outpatient to better characterize. 3. Superimposed bilateral scrotal hydroceles, simple fluid density. 4. Diverticulosis of the large bowel without active inflammation. No evidence of bowel obstruction. 5.  Aortic Atherosclerosis (ICD10-I70.0). Electronically Signed   By: Odessa Fleming M.D.   On: 06/11/2022 07:14    Procedures Procedures    Medications Ordered in  ED Medications  sodium chloride 0.9 % bolus 1,000 mL (1,000 mLs Intravenous New Bag/Given 06/11/22 0627)  iohexol (OMNIPAQUE) 300 MG/ML solution 100 mL (60 mLs Intravenous Contrast Given 06/11/22 6381)    ED Course/ Medical Decision Making/ A&P                           Medical Decision Making Amount and/or Complexity of Data Reviewed External Data Reviewed: notes. Labs:     Details: Chronic kidney disease seems unchanged.  Lipase is elevated but lower than typical values in the system.  Normal WBC. Radiology: independent interpretation performed.    Details: Right inguinal hernia.  No bowel obstruction.  Risk OTC drugs.   Patient has some tenderness overlying where a right inguinal hernia would be but I cannot feel any incarceration or strangulation.  He  is well-appearing currently.  CT images viewed by myself and discussed with general surgery, Dr. Cliffton AstersWhite.  No indication for emergent surgery and would treat this just like he has bowel in it.  Otherwise, will prescribe stool softeners and MiraLAX as he does have a history of constipation and refer him to general surgery as an outpatient.  We discussed return precautions.        Final Clinical Impression(s) / ED Diagnoses Final diagnoses:  Right inguinal hernia    Rx / DC Orders ED Discharge Orders          Ordered    docusate sodium (COLACE) 100 MG capsule  Every 12 hours        06/11/22 0802    polyethylene glycol (MIRALAX / GLYCOLAX) 17 g packet  Daily PRN        06/11/22 0802              Pricilla LovelessGoldston, Joshuah Minella, MD 06/11/22 [phone]

## 2022-06-11 NOTE — Discharge Instructions (Signed)
Your CT scan shows that you have a hernia in your right groin.  Fortunately it is not blocking anything or needing an emergency surgery.  You will need to follow-up with general surgery as an outpatient, call the number listed today to set up an appointment.  However, if you notice a recurrent bulge that will not reduce or flatten, if you develop severe or worsening pain, vomiting, or any other new/concerning symptoms then return to the ER for evaluation or call 911.

## 2022-06-11 NOTE — ED Provider Triage Note (Signed)
Emergency Medicine Provider Triage Evaluation Note  Charles Krueger , a 66 y.o. male  was evaluated in triage.  Pt complains of right groin pain/possible hernia.  Patient was referred here from urgent care for evaluation of a possible inguinal hernia.  He was told to come here for higher level of care and possible imaging studies.  Patient describes a swollen area to the right groin that has come and gone over the past week.  Denies any vomiting, bowel complaints, fevers, or chills.  Review of Systems  Positive: Right groin pain Negative: All other systems reviewed and negative  Physical Exam  BP (!) 156/80 (BP Location: Left Arm)   Pulse 64   Temp 98.4 F (36.9 C) (Oral)   Resp 20   Ht 5\' 9"  (1.753 m)   Wt 74.8 kg   SpO2 100%   BMI 24.37 kg/m  Gen:   Awake, no distress   Resp:  Normal effort  MSK:   Moves extremities without difficulty  Other:  There is tenderness to palpation and fullness noted in the right groin.  There is no obvious palpable defect.  Medical Decision Making  Medically screening exam initiated at 6:00 AM.  Appropriate orders placed.  Charles Krueger was informed that the remainder of the evaluation will be completed by another provider, this initial triage assessment does not replace that evaluation, and the importance of remaining in the ED until their evaluation is complete.  Patient did undergo CT scan for further evaluation of his right groin pain.   Lyda Perone, MD 06/11/22 [phone]

## 2022-06-26 ENCOUNTER — Other Ambulatory Visit: Payer: Self-pay | Admitting: Surgery

## 2022-06-26 DIAGNOSIS — K8689 Other specified diseases of pancreas: Secondary | ICD-10-CM

## 2022-07-12 ENCOUNTER — Other Ambulatory Visit: Payer: Self-pay | Admitting: Surgery

## 2022-07-14 ENCOUNTER — Ambulatory Visit
Admission: RE | Admit: 2022-07-14 | Discharge: 2022-07-14 | Disposition: A | Discharge status: Home / Self Care | Payer: No Typology Code available for payment source | Source: Ambulatory Visit | Attending: Surgery | Admitting: Surgery

## 2022-07-14 DIAGNOSIS — K8689 Other specified diseases of pancreas: Secondary | ICD-10-CM

## 2022-07-14 MED ORDER — GADOPICLENOL 0.5 MMOL/ML IV SOLN
7.5000 mL | Freq: Once | INTRAVENOUS | Status: AC | PRN
  Administered 2022-07-14: 7.5 mL via INTRAVENOUS

## 2022-08-07 ENCOUNTER — Ambulatory Visit (INDEPENDENT_AMBULATORY_CARE_PROVIDER_SITE_OTHER): Payer: No Typology Code available for payment source | Admitting: Pulmonary Disease

## 2022-08-07 ENCOUNTER — Encounter: Payer: Self-pay | Admitting: Pulmonary Disease

## 2022-08-07 VITALS — BP 138/84 | HR 69 | Ht 69.0 in | Wt 149.2 lb

## 2022-08-07 DIAGNOSIS — D860 Sarcoidosis of lung: Secondary | ICD-10-CM | POA: Diagnosis not present

## 2022-08-07 NOTE — Patient Instructions (Signed)
We will check a CT chest scan to evaluate your lungs for the history of sarcoidosis.  Continue stiolto 2 puffs daily and as needed albuterol  Follow up in 3 months with pulmonary function tests
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
## 2022-08-21 ENCOUNTER — Ambulatory Visit: Payer: Self-pay | Admitting: Surgery

## 2022-08-21 DIAGNOSIS — Z01818 Encounter for other preprocedural examination: Secondary | ICD-10-CM

## 2022-08-21 NOTE — Progress Notes (Addendum)
Anesthesia Review:  PCP: Juliann Pulse in [place]  Cardiologist : [facility] [place]- Dr Erin Fulling- [place] 08/07/22  Requested most recent ov note from cardiology and PCP along with most recent EKG, stress and echo.  By fax.   Chest x-ray : EKG : 08/27/22  Echo : 2022  Stress test: Cardiac Cath :  2022  Activity level: can do a flgiht of stairs wtihout difficulty  Sleep Study/ CPAP : has cpap  Fasting Blood Sugar :      / Checks Blood Sugar -- times a day:   Blood Thinner/ Instructions /Last Dose: ASA / Instructions/ Last Dose :    DM- type 2- does not check glucose at home  Hgba1c- 08/27/22- 5.3  Jardiance- Last dose on 08/30/22    Pt states at time of preop appt " pneumonia diagnosis was mentioned to him in 12/23 at [place] visit with hernia.  .  PT with no  symptoms at preop appt.    Called pharamcy x 2 to have meds reconciled.  No answer.  PT given phone number of [phone] to have meds reconciled.  PT voiced understanding.  CMp done 08/27/22 routed to Dr white.

## 2022-08-22 NOTE — Patient Instructions (Addendum)
SURGICAL WAITING ROOM VISITATION  Patients having surgery or a procedure may have no more than 2 support people in the waiting area - these visitors may rotate.    Children under the age of 10 must have an adult with them who is not the patient.  Due to an increase in RSV and influenza rates and associated hospitalizations, children ages 59 and under may not visit patients in [place].  If the patient needs to stay at the hospital during part of their recovery, the visitor guidelines for inpatient rooms apply. Pre-op nurse will coordinate an appropriate time for 1 support person to accompany patient in pre-op.  This support person may not rotate.    Please refer to the [facility] website for the visitor guidelines for Inpatients (after your surgery is over and you are in a regular room).       Your procedure is scheduled on:  3?11/24    Report to [place] Entrance    Report to admitting at  [place] AM   Call this number if you have problems the morning of surgery [phone]   Do not eat food  or drink liquids :After Midnight.                   If you have questions, please contact your surgeon's office.       Oral Hygiene is also important to reduce your risk of infection.                                    Remember - BRUSH YOUR TEETH THE MORNING OF SURGERY WITH YOUR REGULAR TOOTHPASTE  DENTURES WILL BE REMOVED PRIOR TO SURGERY PLEASE DO NOT APPLY "Poly grip" OR ADHESIVES!!!   Do NOT smoke after Midnight   Take these medicines the morning of surgery with A SIP OF WATER:  Inhalers as usual and brin,g allopurinol, coreg, flonase, hydralazine, imdur, eye drops as usual, singulair   DO NOT TAKE ANY ORAL DIABETIC MEDICATIONS DAY OF YOUR SURGERY  Bring CPAP mask and tubing day of surgery.                              You may not have any metal on your body including hair pins, jewelry, and body piercing             Do not wear make-up,  lotions, powders, perfumes/cologne, or deodorant  Do not wear nail polish including gel and S&S, artificial/acrylic nails, or any other type of covering on natural nails including finger and toenails. If you have artificial nails, gel coating, etc. that needs to be removed by a nail salon please have this removed prior to surgery or surgery may need to be canceled/ delayed if the surgeon/ anesthesia feels like they are unable to be safely monitored.   Do not shave  48 hours prior to surgery.               Men may shave face and neck.   Do not bring valuables to the hospital. [place].   Contacts, glasses, dentures or bridgework may not be worn into surgery.   Bring small overnight bag day of surgery.   DO NOT [facility] HOME MEDICATIONS  TO [place]. PHARMACY WILL DISPENSE MEDICATIONS LISTED ON YOUR MEDICATION LIST TO YOU DURING YOUR ADMISSION [place]!    Patients discharged on the day of surgery will not be allowed to drive home.  Someone NEEDS to stay with you for the first 24 hours after anesthesia.   Special Instructions: Bring a copy of your healthcare power of attorney and living will documents the day of surgery if you haven't scanned them before.              Please read over the following fact sheets you were given: IF [place] [phone]   If you received a COVID test during your pre-op visit  it is requested that you wear a mask when out in public, stay away from anyone that may not be feeling well and notify your surgeon if you develop symptoms. If you test positive for Covid or have been in contact with anyone that has tested positive in the last 10 days please notify you surgeon.    [place] - Preparing for Surgery Before surgery, you can play an important role.  Because skin is not sterile, your skin needs to be as free of germs as possible.  You can reduce  the number of germs on your skin by washing with CHG (chlorahexidine gluconate) soap before surgery.  CHG is an antiseptic cleaner which kills germs and bonds with the skin to continue killing germs even after washing. Please DO NOT use if you have an allergy to CHG or antibacterial soaps.  If your skin becomes reddened/irritated stop using the CHG and inform your nurse when you arrive at Short Stay. Do not shave (including legs and underarms) for at least 48 hours prior to the first CHG shower.  You may shave your face/neck. Please follow these instructions carefully:  1.  Shower with CHG Soap the night before surgery and the  morning of Surgery.  2.  If you choose to wash your hair, wash your hair first as usual with your  normal  shampoo.  3.  After you shampoo, rinse your hair and body thoroughly to remove the  shampoo.                           4.  Use CHG as you would any other liquid soap.  You can apply chg directly  to the skin and wash                       Gently with a scrungie or clean washcloth.  5.  Apply the CHG Soap to your body ONLY FROM THE NECK DOWN.   Do not use on face/ open                           Wound or open sores. Avoid contact with eyes, ears mouth and genitals (private parts).                       Wash face,  Genitals (private parts) with your normal soap.             6.  Wash thoroughly, paying special attention to the area where your surgery  will be performed.  7.  Thoroughly rinse your body with warm water from the neck down.  8.  DO NOT shower/wash with your  normal soap after using and rinsing off  the CHG Soap.                9.  Pat yourself dry with a clean towel.            10.  Wear clean pajamas.            11.  Place clean sheets on your bed the night of your first shower and do not  sleep with pets. Day of Surgery : Do not apply any lotions/deodorants the morning of surgery.  Please wear clean clothes to the hospital/surgery center.  FAILURE TO FOLLOW  THESE INSTRUCTIONS MAY RESULT IN THE CANCELLATION OF YOUR SURGERY PATIENT SIGNATURE_________________________________  NURSE SIGNATURE__________________________________  ________________________________________________________________________

## 2022-08-24 ENCOUNTER — Ambulatory Visit (HOSPITAL_COMMUNITY): Payer: No Typology Code available for payment source | Attending: Pulmonary Disease

## 2022-08-27 ENCOUNTER — Encounter (HOSPITAL_COMMUNITY)
Admission: RE | Admit: 2022-08-27 | Discharge: 2022-08-27 | Disposition: A | Discharge status: Home / Self Care | Payer: No Typology Code available for payment source | Source: Ambulatory Visit | Attending: Surgery | Admitting: Surgery

## 2022-08-27 ENCOUNTER — Other Ambulatory Visit: Payer: Self-pay

## 2022-08-27 ENCOUNTER — Encounter (HOSPITAL_COMMUNITY): Payer: Self-pay

## 2022-08-27 DIAGNOSIS — J449 Chronic obstructive pulmonary disease, unspecified: Secondary | ICD-10-CM | POA: Insufficient documentation

## 2022-08-27 DIAGNOSIS — Z01818 Encounter for other preprocedural examination: Secondary | ICD-10-CM | POA: Insufficient documentation

## 2022-08-27 DIAGNOSIS — N183 Chronic kidney disease, stage 3 unspecified: Secondary | ICD-10-CM | POA: Insufficient documentation

## 2022-08-27 DIAGNOSIS — K4090 Unilateral inguinal hernia, without obstruction or gangrene, not specified as recurrent: Secondary | ICD-10-CM | POA: Diagnosis not present

## 2022-08-27 DIAGNOSIS — I130 Hypertensive heart and chronic kidney disease with heart failure and stage 1 through stage 4 chronic kidney disease, or unspecified chronic kidney disease: Secondary | ICD-10-CM | POA: Diagnosis not present

## 2022-08-27 DIAGNOSIS — E119 Type 2 diabetes mellitus without complications: Secondary | ICD-10-CM | POA: Insufficient documentation

## 2022-08-27 DIAGNOSIS — G4733 Obstructive sleep apnea (adult) (pediatric): Secondary | ICD-10-CM | POA: Diagnosis not present

## 2022-08-27 DIAGNOSIS — D869 Sarcoidosis, unspecified: Secondary | ICD-10-CM | POA: Diagnosis not present

## 2022-08-27 DIAGNOSIS — F17200 Nicotine dependence, unspecified, uncomplicated: Secondary | ICD-10-CM | POA: Insufficient documentation

## 2022-08-27 HISTORY — DX: Acute myocardial infarction, unspecified: I21.9

## 2022-08-27 HISTORY — DX: Chronic obstructive pulmonary disease, unspecified: J44.9

## 2022-08-27 HISTORY — DX: Sarcoidosis, unspecified: D86.9

## 2022-08-27 HISTORY — DX: Gastro-esophageal reflux disease without esophagitis: K21.9

## 2022-08-27 HISTORY — DX: Pneumonia, unspecified organism: J18.9

## 2022-08-27 LAB — CBC WITH DIFFERENTIAL/PLATELET
Abs Immature Granulocytes: 0.02 10*3/uL (ref 0.00–0.07)
Basophils Absolute: 0 10*3/uL (ref 0.0–0.1)
Basophils Relative: 0 %
Eosinophils Absolute: 0.1 10*3/uL (ref 0.0–0.5)
Eosinophils Relative: 2 %
HCT: 44.4 % (ref 39.0–52.0)
Hemoglobin: 14.7 g/dL (ref 13.0–17.0)
Immature Granulocytes: 0 %
Lymphocytes Relative: 22 %
Lymphs Abs: 1.3 10*3/uL (ref 0.7–4.0)
MCH: 34.1 pg — ABNORMAL HIGH (ref 26.0–34.0)
MCHC: 33.1 g/dL (ref 30.0–36.0)
MCV: 103 fL — ABNORMAL HIGH (ref 80.0–100.0)
Monocytes Absolute: 0.7 10*3/uL (ref 0.1–1.0)
Monocytes Relative: 12 %
Neutro Abs: 3.8 10*3/uL (ref 1.7–7.7)
Neutrophils Relative %: 64 %
Platelets: 144 10*3/uL — ABNORMAL LOW (ref 150–400)
RBC: 4.31 MIL/uL (ref 4.22–5.81)
RDW: 15.9 % — ABNORMAL HIGH (ref 11.5–15.5)
WBC: 6 10*3/uL (ref 4.0–10.5)
nRBC: 0 % (ref 0.0–0.2)

## 2022-08-27 LAB — GLUCOSE, CAPILLARY: Glucose-Capillary: 148 mg/dL — ABNORMAL HIGH (ref 70–99)

## 2022-08-27 LAB — COMPREHENSIVE METABOLIC PANEL
ALT: 15 U/L (ref 0–44)
AST: 18 U/L (ref 15–41)
Albumin: 3.4 g/dL — ABNORMAL LOW (ref 3.5–5.0)
Alkaline Phosphatase: 162 U/L — ABNORMAL HIGH (ref 38–126)
Anion gap: 6 (ref 5–15)
BUN: 31 mg/dL — ABNORMAL HIGH (ref 8–23)
CO2: 27 mmol/L (ref 22–32)
Calcium: 10.1 mg/dL (ref 8.9–10.3)
Chloride: 103 mmol/L (ref 98–111)
Creatinine, Ser: 2.08 mg/dL — ABNORMAL HIGH (ref 0.61–1.24)
GFR, Estimated: 35 mL/min — ABNORMAL LOW (ref >60)
Glucose, Bld: 132 mg/dL — ABNORMAL HIGH (ref 70–99)
Potassium: 4.6 mmol/L (ref 3.5–5.1)
Sodium: 136 mmol/L (ref 135–145)
Total Bilirubin: 0.6 mg/dL (ref 0.3–1.2)
Total Protein: 7.2 g/dL (ref 6.5–8.1)

## 2022-08-28 LAB — HEMOGLOBIN A1C
Hgb A1c MFr Bld: 5.3 % (ref 4.8–5.6)
Mean Plasma Glucose: 105 mg/dL

## 2022-08-29 NOTE — Anesthesia Preprocedure Evaluation (Addendum)
Anesthesia Evaluation  Patient identified by MRN, date of birth, ID band Patient awake    Reviewed: Allergy & Precautions, NPO status , Patient's Chart, lab work & pertinent test results  Airway Mallampati: II  TM Distance: >3 FB Neck ROM: Full    Dental  (+) Edentulous Upper, Missing   Pulmonary sleep apnea and Continuous Positive Airway Pressure Ventilation , COPD,  COPD inhaler, Current Smoker and Patient abstained from smoking.   Pulmonary exam normal        Cardiovascular hypertension, Pt. on medications and Pt. on home beta blockers + CAD, + Past MI and +CHF  Normal cardiovascular exam  Addendum:  Clearance received from cardiology 07/19/2022 which states, "on his last visit with [place] 08/30/2021 he was doing well, remained symptomatically stable. Using the revised cardiac risk index for preoperative risk calculator he has 3 points (history of ischemic heart disease, history of heart failure, elevated preoperative creatinine). This correlates with moderate but not prohibitive risk of perioperative cardiac complication. He can proceed with surgery without further preoperative testing. He can hold his aspirin for 5 days prior to procedure without bridging medication."    Neuro/Psych negative neurological ROS  negative psych ROS   GI/Hepatic negative GI ROS, Neg liver ROS,,,Inguinal hernia    Endo/Other  diabetes, Type 2    Renal/GU CRFRenal disease     Musculoskeletal negative musculoskeletal ROS (+)    Abdominal   Peds  Hematology negative hematology ROS (+) Blood dyscrasia, anemia   Anesthesia Other Findings  RIGHT INGUINAL HERNIA  Reproductive/Obstetrics                             Anesthesia Physical Anesthesia Plan  ASA: 3  Anesthesia Plan: General   Post-op Pain Management: Celebrex PO (pre-op)* and Tylenol PO (pre-op)*   Induction: Intravenous  PONV Risk Score and Plan: 1 and  Ondansetron, Dexamethasone, Treatment may vary due to age or medical condition and Midazolam  Airway Management Planned: Oral ETT  Additional Equipment: None  Intra-op Plan:   Post-operative Plan: Extubation in OR  Informed Consent: I have reviewed the patients History and Physical, chart, labs and discussed the procedure including the risks, benefits and alternatives for the proposed anesthesia with the patient or authorized representative who has indicated his/her understanding and acceptance.     Dental advisory given  Plan Discussed with: CRNA  Anesthesia Plan Comments: (PAT note 08/27/2022 Intraoperative block by surgeon)       Anesthesia Quick Evaluation

## 2022-08-29 NOTE — Progress Notes (Addendum)
Anesthesia Chart Review   Case: [id number] Date/Time: 09/03/22 0715   Procedure: RIGHT HERNIA REPAIR INGUINAL ADULT (Right)   Anesthesia type: General   Pre-op diagnosis: RIGHT INGUINAL HERNIA   Location: [facility] ROOM 02 / [facility]   Surgeons: Ileana Roup, MD       DISCUSSION:67 y.o. smoker with h/o HTN, OSA on CPAP, DM II (A1C 5.3), CKD Stage III, CAD, COPD, sarcoidosis, right inguinal hernia scheduled for above procedure 09/03/2022 with Dr. Nadeen Landau.   Pt seen by pulmonology 08/07/2022. Per notes sarcoidosis stable.   Creatinine 2.08 08/27/2022 Creatinine 1.78 06/10/2022 Creatinine 2.15 04/20/2022  H/o CAD with chronic occlusion of his RCA and moderate disease in his circumflex LAD and ramus intermediate vessel which has been treated medically. Previously followed by heart failure clinic, last seen 06/23/2021.  Requested cardiac clearance.   Addendum:  Clearance received from cardiology 07/19/2022 which states, "on his last visit with [place] 08/30/2021 he was doing well, remained symptomatically stable. Using the revised cardiac risk index for preoperative risk calculator he has 3 points (history of ischemic heart disease, history of heart failure, elevated preoperative creatinine). This correlates with moderate but not prohibitive risk of perioperative cardiac complication. He can proceed with surgery without further preoperative testing. He can hold his aspirin for 5 days prior to procedure without bridging medication."   VS: BP 103/77   Pulse 78   Temp 37.1 C (Oral)   Resp 16   Ht '5\' 9"'$  (1.753 m)   Wt 64.4 kg   SpO2 100%   BMI 20.97 kg/m   PROVIDERS: Clinic, [facility] is PCP    LABS: Labs reviewed: Acceptable for surgery. (all labs ordered are listed, but only abnormal results are displayed)  Labs Reviewed  CBC WITH DIFFERENTIAL/PLATELET - Abnormal; Notable for the following components:      Result Value   MCV 103.0 (*)    MCH 34.1 (*)    RDW 15.9 (*)     Platelets 144 (*)    All other components within normal limits  COMPREHENSIVE METABOLIC PANEL - Abnormal; Notable for the following components:   Glucose, Bld 132 (*)    BUN 31 (*)    Creatinine, Ser 2.08 (*)    Albumin 3.4 (*)    Alkaline Phosphatase 162 (*)    GFR, Estimated 35 (*)    All other components within normal limits  GLUCOSE, CAPILLARY - Abnormal; Notable for the following components:   Glucose-Capillary 148 (*)    All other components within normal limits  HEMOGLOBIN A1C     IMAGES:   EKG:   CV: Echo 05/12/2021 1. Left ventricular ejection fraction, by estimation, is 55 to 60%. The  left ventricle has normal function. The left ventricle demonstrates  regional wall motion abnormalities with basal inferior akinesis. There is  mild left ventricular hypertrophy. Left   ventricular diastolic parameters are consistent with Grade I diastolic  dysfunction (impaired relaxation).   2. Right ventricular systolic function is normal. The right ventricular  size is normal. Tricuspid regurgitation signal is inadequate for assessing  PA pressure.   3. Left atrial size was mildly dilated.   4. The aortic valve is tricuspid. Aortic valve regurgitation is not  visualized. No aortic stenosis is present.   5. The mitral valve is normal in structure. Trivial mitral valve  regurgitation. No evidence of mitral stenosis.   6. The inferior vena cava is normal in size with greater than 50%  respiratory variability, suggesting right  atrial pressure of 3 mmHg.  Past Medical History:  Diagnosis Date   COPD (chronic obstructive pulmonary disease) ([place])    Diabetes mellitus without complication (HCC)    GERD (gastroesophageal reflux disease)    HTN (hypertension)    Myocardial infarction ([place])    silent Mi several years ago   OSA on CPAP    Pneumonia    pt reports was mentioned in 12/23 at [place] visit   Sarcoidosis     Past Surgical History:  Procedure Laterality Date    RIGHT/LEFT HEART CATH AND CORONARY ANGIOGRAPHY N/A 12/14/2020   Procedure: RIGHT/LEFT HEART CATH AND CORONARY ANGIOGRAPHY;  Surgeon: Leonie Man, MD;  Location: [facility];  Service: Cardiovascular;  Laterality: N/A;    MEDICATIONS:  albuterol (VENTOLIN HFA) 108 (90 Base) MCG/ACT inhaler   allopurinol (ZYLOPRIM) 100 MG tablet   aspirin 81 MG tablet   atorvastatin (LIPITOR) 40 MG tablet   buPROPion (WELLBUTRIN XL) 150 MG 24 hr tablet   carvedilol (COREG) 25 MG tablet   empagliflozin (JARDIANCE) 10 MG TABS tablet   fluticasone (FLONASE) 50 MCG/ACT nasal spray   hydrALAZINE (APRESOLINE) 50 MG tablet   ipratropium (ATROVENT) 0.03 % nasal spray   isosorbide mononitrate (IMDUR) 30 MG 24 hr tablet   ketotifen (ZADITOR) 0.025 % ophthalmic solution   montelukast (SINGULAIR) 10 MG tablet   Multiple Vitamin (MULTIVITAMIN WITH MINERALS) TABS tablet   Omega-3 Fatty Acids (FISH OIL) 1200 MG CAPS   sacubitril-valsartan (ENTRESTO) 97-103 MG   spironolactone (ALDACTONE) 25 MG tablet   Tiotropium Bromide-Olodaterol (STIOLTO RESPIMAT) 2.5-2.5 MCG/ACT AERS   No current facility-administered medications for this encounter.     Konrad Felix Ward, PA-C [facility] [phone]

## 2022-09-03 ENCOUNTER — Other Ambulatory Visit: Payer: Self-pay

## 2022-09-03 ENCOUNTER — Ambulatory Visit (HOSPITAL_COMMUNITY): Payer: No Typology Code available for payment source | Admitting: Physician Assistant

## 2022-09-03 ENCOUNTER — Encounter (HOSPITAL_COMMUNITY): Payer: Self-pay | Admitting: Surgery

## 2022-09-03 ENCOUNTER — Other Ambulatory Visit (HOSPITAL_COMMUNITY): Payer: Self-pay

## 2022-09-03 ENCOUNTER — Ambulatory Visit (HOSPITAL_COMMUNITY)
Admission: RE | Admit: 2022-09-03 | Discharge: 2022-09-03 | Disposition: A | Discharge status: Home / Self Care | Payer: No Typology Code available for payment source | Source: Ambulatory Visit | Attending: Surgery | Admitting: Surgery

## 2022-09-03 ENCOUNTER — Ambulatory Visit (HOSPITAL_BASED_OUTPATIENT_CLINIC_OR_DEPARTMENT_OTHER): Payer: No Typology Code available for payment source | Admitting: Anesthesiology

## 2022-09-03 ENCOUNTER — Encounter (HOSPITAL_COMMUNITY)
Admission: RE | Disposition: A | Discharge status: Home / Self Care | Payer: Self-pay | Source: Ambulatory Visit | Attending: Surgery

## 2022-09-03 DIAGNOSIS — E1122 Type 2 diabetes mellitus with diabetic chronic kidney disease: Secondary | ICD-10-CM | POA: Insufficient documentation

## 2022-09-03 DIAGNOSIS — I259 Chronic ischemic heart disease, unspecified: Secondary | ICD-10-CM | POA: Insufficient documentation

## 2022-09-03 DIAGNOSIS — N189 Chronic kidney disease, unspecified: Secondary | ICD-10-CM | POA: Diagnosis not present

## 2022-09-03 DIAGNOSIS — I252 Old myocardial infarction: Secondary | ICD-10-CM | POA: Diagnosis not present

## 2022-09-03 DIAGNOSIS — G4733 Obstructive sleep apnea (adult) (pediatric): Secondary | ICD-10-CM | POA: Diagnosis not present

## 2022-09-03 DIAGNOSIS — I2510 Atherosclerotic heart disease of native coronary artery without angina pectoris: Secondary | ICD-10-CM | POA: Insufficient documentation

## 2022-09-03 DIAGNOSIS — Z01818 Encounter for other preprocedural examination: Secondary | ICD-10-CM

## 2022-09-03 DIAGNOSIS — I130 Hypertensive heart and chronic kidney disease with heart failure and stage 1 through stage 4 chronic kidney disease, or unspecified chronic kidney disease: Secondary | ICD-10-CM

## 2022-09-03 DIAGNOSIS — J449 Chronic obstructive pulmonary disease, unspecified: Secondary | ICD-10-CM | POA: Diagnosis not present

## 2022-09-03 DIAGNOSIS — K4090 Unilateral inguinal hernia, without obstruction or gangrene, not specified as recurrent: Secondary | ICD-10-CM | POA: Insufficient documentation

## 2022-09-03 DIAGNOSIS — Z7984 Long term (current) use of oral hypoglycemic drugs: Secondary | ICD-10-CM

## 2022-09-03 DIAGNOSIS — D631 Anemia in chronic kidney disease: Secondary | ICD-10-CM

## 2022-09-03 DIAGNOSIS — F17290 Nicotine dependence, other tobacco product, uncomplicated: Secondary | ICD-10-CM | POA: Insufficient documentation

## 2022-09-03 DIAGNOSIS — I509 Heart failure, unspecified: Secondary | ICD-10-CM | POA: Insufficient documentation

## 2022-09-03 DIAGNOSIS — I5042 Chronic combined systolic (congestive) and diastolic (congestive) heart failure: Secondary | ICD-10-CM

## 2022-09-03 DIAGNOSIS — F17210 Nicotine dependence, cigarettes, uncomplicated: Secondary | ICD-10-CM

## 2022-09-03 HISTORY — PX: INGUINAL HERNIA REPAIR: SHX194

## 2022-09-03 LAB — GLUCOSE, CAPILLARY
Glucose-Capillary: 90 mg/dL (ref 70–99)
Glucose-Capillary: 112 mg/dL — ABNORMAL HIGH (ref 70–99)

## 2022-09-03 SURGERY — REPAIR, HERNIA, INGUINAL, ADULT
Anesthesia: Regional | Laterality: Right

## 2022-09-03 MED ORDER — DEXAMETHASONE SODIUM PHOSPHATE 4 MG/ML IJ SOLN
INTRAMUSCULAR | Status: DC | PRN
  Administered 2022-09-03: 5 mg via INTRAVENOUS

## 2022-09-03 MED ORDER — ONDANSETRON HCL 4 MG/2ML IJ SOLN
INTRAMUSCULAR | Status: AC
  Filled 2022-09-03: qty 2

## 2022-09-03 MED ORDER — CHLORHEXIDINE GLUCONATE 0.12 % MT SOLN
15.0000 mL | Freq: Once | OROMUCOSAL | Status: AC
  Administered 2022-09-03: 15 mL via OROMUCOSAL

## 2022-09-03 MED ORDER — ROCURONIUM BROMIDE 100 MG/10ML IV SOLN
INTRAVENOUS | Status: DC | PRN
  Administered 2022-09-03: 20 mg via INTRAVENOUS
  Administered 2022-09-03: 10 mg via INTRAVENOUS
  Administered 2022-09-03: 40 mg via INTRAVENOUS

## 2022-09-03 MED ORDER — MIDAZOLAM HCL 2 MG/2ML IJ SOLN
INTRAMUSCULAR | Status: AC
  Filled 2022-09-03: qty 2

## 2022-09-03 MED ORDER — AMISULPRIDE (ANTIEMETIC) 5 MG/2ML IV SOLN: 10.0000 mg | Freq: Once | INTRAVENOUS | Status: DC | PRN

## 2022-09-03 MED ORDER — BUPIVACAINE LIPOSOME 1.3 % IJ SUSP
INTRAMUSCULAR | Status: AC
  Filled 2022-09-03: qty 20

## 2022-09-03 MED ORDER — FENTANYL CITRATE (PF) 100 MCG/2ML IJ SOLN
INTRAMUSCULAR | Status: AC
  Filled 2022-09-03: qty 2

## 2022-09-03 MED ORDER — FENTANYL CITRATE (PF) 100 MCG/2ML IJ SOLN
INTRAMUSCULAR | Status: DC | PRN
  Administered 2022-09-03 (×3): 25 ug via INTRAVENOUS

## 2022-09-03 MED ORDER — PROPOFOL 10 MG/ML IV BOLUS
INTRAVENOUS | Status: DC | PRN
  Administered 2022-09-03: 100 mg via INTRAVENOUS

## 2022-09-03 MED ORDER — CHLORHEXIDINE GLUCONATE CLOTH 2 % EX PADS: 6.0000 | MEDICATED_PAD | Freq: Once | CUTANEOUS | Status: DC

## 2022-09-03 MED ORDER — ROCURONIUM BROMIDE 10 MG/ML (PF) SYRINGE
PREFILLED_SYRINGE | INTRAVENOUS | Status: AC
  Filled 2022-09-03: qty 10

## 2022-09-03 MED ORDER — LACTATED RINGERS IV SOLN: INTRAVENOUS | Status: DC

## 2022-09-03 MED ORDER — FENTANYL CITRATE PF 50 MCG/ML IJ SOSY: 25.0000 ug | PREFILLED_SYRINGE | INTRAMUSCULAR | Status: DC | PRN

## 2022-09-03 MED ORDER — ACETAMINOPHEN 500 MG PO TABS
1000.0000 mg | ORAL_TABLET | Freq: Once | ORAL | Status: AC
  Administered 2022-09-03: 1000 mg via ORAL
  Filled 2022-09-03: qty 2

## 2022-09-03 MED ORDER — OXYCODONE HCL 5 MG PO TABS
5.0000 mg | ORAL_TABLET | Freq: Four times a day (QID) | ORAL | 0 refills | Status: AC | PRN
  Filled 2022-09-03: qty 20, 5d supply, fill #0

## 2022-09-03 MED ORDER — ACETAMINOPHEN 500 MG PO TABS: 1000.0000 mg | ORAL_TABLET | ORAL | Status: DC

## 2022-09-03 MED ORDER — PHENYLEPHRINE 80 MCG/ML (10ML) SYRINGE FOR IV PUSH (FOR BLOOD PRESSURE SUPPORT)
PREFILLED_SYRINGE | INTRAVENOUS | Status: AC
  Filled 2022-09-03: qty 10

## 2022-09-03 MED ORDER — MIDAZOLAM HCL 5 MG/5ML IJ SOLN
INTRAMUSCULAR | Status: DC | PRN
  Administered 2022-09-03: 1 mg via INTRAVENOUS
  Administered 2022-09-03: 25 mg via INTRAVENOUS
  Administered 2022-09-03: 1 mg via INTRAVENOUS

## 2022-09-03 MED ORDER — PROPOFOL 10 MG/ML IV BOLUS
INTRAVENOUS | Status: AC
  Filled 2022-09-03: qty 20

## 2022-09-03 MED ORDER — DEXAMETHASONE SODIUM PHOSPHATE 10 MG/ML IJ SOLN
INTRAMUSCULAR | Status: AC
  Filled 2022-09-03: qty 1

## 2022-09-03 MED ORDER — EPHEDRINE SULFATE (PRESSORS) 50 MG/ML IJ SOLN
INTRAMUSCULAR | Status: DC | PRN
  Administered 2022-09-03: 5 mg via INTRAVENOUS

## 2022-09-03 MED ORDER — SUGAMMADEX SODIUM 200 MG/2ML IV SOLN
INTRAVENOUS | Status: DC | PRN
  Administered 2022-09-03: 200 mg via INTRAVENOUS

## 2022-09-03 MED ORDER — BUPIVACAINE-EPINEPHRINE (PF) 0.25% -1:200000 IJ SOLN
INTRAMUSCULAR | Status: AC
  Filled 2022-09-03: qty 30

## 2022-09-03 MED ORDER — LACTATED RINGERS IV SOLN: INTRAVENOUS | Status: DC | PRN

## 2022-09-03 MED ORDER — LIDOCAINE HCL (PF) 2 % IJ SOLN
INTRAMUSCULAR | Status: AC
  Filled 2022-09-03: qty 5

## 2022-09-03 MED ORDER — ONDANSETRON HCL 4 MG/2ML IJ SOLN: 4.0000 mg | Freq: Once | INTRAMUSCULAR | Status: DC | PRN

## 2022-09-03 MED ORDER — BUPIVACAINE-EPINEPHRINE (PF) 0.25% -1:200000 IJ SOLN
INTRAMUSCULAR | Status: DC | PRN
  Administered 2022-09-03: 40 mL

## 2022-09-03 MED ORDER — 0.9 % SODIUM CHLORIDE (POUR BTL) OPTIME
TOPICAL | Status: DC | PRN
  Administered 2022-09-03: 1000 mL

## 2022-09-03 MED ORDER — CELECOXIB 200 MG PO CAPS
200.0000 mg | ORAL_CAPSULE | Freq: Once | ORAL | Status: AC
  Administered 2022-09-03: 200 mg via ORAL
  Filled 2022-09-03: qty 1

## 2022-09-03 MED ORDER — ONDANSETRON HCL 4 MG/2ML IJ SOLN
INTRAMUSCULAR | Status: DC | PRN
  Administered 2022-09-03: 4 mg via INTRAVENOUS

## 2022-09-03 MED ORDER — CEFAZOLIN SODIUM-DEXTROSE 2-4 GM/100ML-% IV SOLN
2.0000 g | INTRAVENOUS | Status: AC
  Administered 2022-09-03: 2 g via INTRAVENOUS
  Filled 2022-09-03: qty 100

## 2022-09-03 MED ORDER — LIDOCAINE HCL (CARDIAC) PF 100 MG/5ML IV SOSY
PREFILLED_SYRINGE | INTRAVENOUS | Status: DC | PRN
  Administered 2022-09-03: 100 mg via INTRAVENOUS

## 2022-09-03 MED ORDER — ORAL CARE MOUTH RINSE: 15.0000 mL | Freq: Once | OROMUCOSAL | Status: AC

## 2022-09-03 MED ORDER — PHENYLEPHRINE HCL (PRESSORS) 10 MG/ML IV SOLN
INTRAVENOUS | Status: DC | PRN
  Administered 2022-09-03 (×4): 80 ug via INTRAVENOUS

## 2022-09-03 SURGICAL SUPPLY — 33 items
ADH SKN CLS APL DERMABOND .7 (GAUZE/BANDAGES/DRESSINGS) ×1
BAG COUNTER SPONGE SURGICOUNT (BAG) IMPLANT
BAG SPNG CNTER NS LX DISP (BAG)
COVER SURGICAL LIGHT HANDLE (MISCELLANEOUS) ×1 IMPLANT
DERMABOND ADVANCED .7 DNX12 (GAUZE/BANDAGES/DRESSINGS) ×1 IMPLANT
DRAIN PENROSE 0.5X18 (DRAIN) ×1 IMPLANT
DRAPE LAPAROTOMY TRNSV 102X78 (DRAPES) ×1 IMPLANT
ELECT REM PT RETURN 15FT ADLT (MISCELLANEOUS) ×1 IMPLANT
GLOVE BIO SURGEON STRL SZ7.5 (GLOVE) ×1 IMPLANT
GLOVE INDICATOR 8.0 STRL GRN (GLOVE) ×1 IMPLANT
GOWN STRL REUS W/ TWL XL LVL3 (GOWN DISPOSABLE) ×2 IMPLANT
GOWN STRL REUS W/TWL XL LVL3 (GOWN DISPOSABLE) ×2
KIT BASIN OR (CUSTOM PROCEDURE TRAY) ×1 IMPLANT
KIT TURNOVER KIT A (KITS) IMPLANT
MARKER SKIN DUAL TIP RULER LAB (MISCELLANEOUS) ×1 IMPLANT
MESH ULTRAPRO 6X6 15CM15CM (Mesh General) IMPLANT
NDL HYPO 22X1.5 SAFETY MO (MISCELLANEOUS) ×1 IMPLANT
NEEDLE HYPO 22X1.5 SAFETY MO (MISCELLANEOUS) ×1 IMPLANT
NEEDLE SAFETY HYPO 22GAX1.5 (MISCELLANEOUS) ×1
NS IRRIG 1000ML POUR BTL (IV SOLUTION) ×1 IMPLANT
PACK GENERAL/GYN (CUSTOM PROCEDURE TRAY) ×1 IMPLANT
SPIKE FLUID TRANSFER (MISCELLANEOUS) ×1 IMPLANT
SUT ETHIBOND 0 MO6 C/R (SUTURE) ×2 IMPLANT
SUT MNCRL AB 4-0 PS2 18 (SUTURE) ×1 IMPLANT
SUT VIC AB 2-0 SH 27 (SUTURE) ×2
SUT VIC AB 2-0 SH 27X BRD (SUTURE) ×1 IMPLANT
SUT VIC AB 3-0 SH 27 (SUTURE) ×1
SUT VIC AB 3-0 SH 27X BRD (SUTURE) ×1 IMPLANT
SYR 20ML LL LF (SYRINGE) ×1 IMPLANT
TOWEL OR 17X26 10 PK STRL BLUE (TOWEL DISPOSABLE) ×1 IMPLANT
TOWEL OR NON WOVEN STRL DISP B (DISPOSABLE) ×1 IMPLANT
TRAY FOLEY MTR SLVR 14FR STAT (SET/KITS/TRAYS/PACK) ×1 IMPLANT
TRAY FOLEY MTR SLVR 16FR STAT (SET/KITS/TRAYS/PACK) ×1 IMPLANT

## 2022-09-03 NOTE — Transfer of Care (Signed)
Immediate Anesthesia Transfer of Care Note  Patient: Damonn Ragazzo  Procedure(s) Performed: OPEN RIGHT INGUINAL HERNIA REPAIR WITH MESH (Right)  Patient Location: PACU  Anesthesia Type:General  Level of Consciousness: awake and alert   Airway & Oxygen Therapy: Patient Spontanous Breathing and Patient connected to face mask oxygen  Post-op Assessment: Report given to RN and Post -op Vital signs reviewed and stable  Post vital signs: Reviewed and stable  Last Vitals:  Vitals Value Taken Time  BP 100/67 09/03/22 0851  Temp    Pulse 69 09/03/22 0857  Resp 23 09/03/22 0857  SpO2 93 % 09/03/22 0857  Vitals shown include unvalidated device data.  Last Pain:  Vitals:   09/03/22 0607  TempSrc: Oral  PainSc:          Complications: No notable events documented.

## 2022-09-03 NOTE — Discharge Instructions (Addendum)
POST OP INSTRUCTIONS  DIET: As tolerated. Follow a light bland diet the first 24 hours after arrival home, such as soup, liquids, crackers, etc.  Be sure to include lots of fluids daily.  Avoid fast food or heavy meals as your are more likely to get nauseated.  Eat a low fat the next few days after surgery.  Take your usually prescribed home medications unless otherwise directed.  PAIN CONTROL: Pain is best controlled by a usual combination of three different methods TOGETHER: Ice/Heat Over the counter pain medication Prescription pain medication Most patients will experience some swelling and bruising around the surgical site.  Ice packs or heating pads (30-60 minutes up to 6 times a day) will help. Some people prefer to use ice alone, heat alone, alternating between ice & heat.  Experiment to what works for you.  Swelling and bruising can take several weeks to resolve.   It is helpful to take an over-the-counter pain medication regularly for the first few weeks: Ibuprofen (Motrin/Advil) - '200mg'$  tabs - take 3 tabs ('600mg'$ ) every 6 hours as needed for pain Acetaminophen (Tylenol) - you may take '650mg'$  every 6 hours as needed. You can take this with motrin as they act differently on the body. If you are taking a narcotic pain medication that has acetaminophen in it, do not take over the counter tylenol at the same time.  Iii. NOTE: You may take both of these medications together - most patients  find it most helpful when alternating between the two (i.e. Ibuprofen at 6am,  tylenol at 9am, ibuprofen at 12pm ..Marland Kitchen) A  prescription for pain medication should be given to you upon discharge.  Take your pain medication as prescribed if your pain is not adequatly controlled with the over-the-counter pain reliefs mentioned above.  Avoid getting constipated.  Between the surgery and the pain medications, it is common to experience some constipation.  Increasing fluid intake and taking a fiber supplement (such as  Metamucil, Citrucel, FiberCon, MiraLax, etc) 1-2 times a day regularly will usually help prevent this problem from occurring.  A mild laxative (prune juice, Milk of Magnesia, MiraLax, etc) should be taken according to package directions if there are no bowel movements after 48 hours.    Dressing: Your incision is covered in Dermabond which is like sterile superglue for the skin. This will come off on it's own in a couple weeks. It is waterproof and you may bathe normally starting the day after your surgery in a shower. Avoid baths/pools/lakes/oceans until your wounds have fully healed. Some bruising in the right groin, along the penis and even scrotum is often noted after this surgery. This is normal and will improve over the coming weeks. It is also not uncommon to notice some numbness to the skin over the groin, side of scrotum and inside of the right thigh.   ACTIVITIES as tolerated:   Avoid heavy lifting (>10lbs or 1 gallon of milk) for the next 6 weeks. You may resume regular (light) daily activities beginning the next day--such as daily self-care, walking, climbing stairs--gradually increasing activities as tolerated.  If you can walk 30 minutes without difficulty, it is safe to try more intense activity such as jogging, treadmill, bicycling, low-impact aerobics.  DO NOT PUSH THROUGH PAIN.  Let pain be your guide: If it hurts to do something, don't do it. You may drive when you are no longer taking prescription pain medication, you can comfortably wear a seatbelt, and you can safely maneuver your  car and apply brakes.   FOLLOW UP in our office Please call [facility] at [phone] to set up an appointment to see your surgeon in the office for a follow-up appointment approximately 2 weeks after your surgery. Make sure that you call for this appointment the day you arrive home to insure a convenient appointment time.  9. If you have disability or family leave forms that need to be completed, you may  have them completed by your primary care physician's office; for return to work instructions, please ask our office staff and they will be happy to assist you in obtaining this documentation   When to call us [phone]: Poor pain control Reactions / problems with new medications (rash/itching, etc)  Fever over 101.5 F (38.5 C) Inability to urinate Nausea/vomiting Worsening swelling or bruising Continued bleeding from incision. Increased pain, redness, or drainage from the incision  The clinic staff is available to answer your questions during regular business hours (8:30am-5pm).  Please don't hesitate to call and ask to speak to one of our nurses for clinical concerns.   A surgeon from [facility] is always on call at the hospitals   If you have a medical emergency, go to the nearest emergency room or call 911.  [facility] [address], [place], [place], [place]  [address] MAIN: [phone] FAX: [phone] [email or web address]

## 2022-09-03 NOTE — Anesthesia Postprocedure Evaluation (Signed)
Anesthesia Post Note  Patient: Charles Krueger  Procedure(s) Performed: OPEN RIGHT INGUINAL HERNIA REPAIR WITH MESH (Right)     Patient location during evaluation: PACU Anesthesia Type: General Level of consciousness: awake Pain management: pain level controlled Vital Signs Assessment: post-procedure vital signs reviewed and stable Respiratory status: spontaneous breathing, nonlabored ventilation and respiratory function stable Cardiovascular status: blood pressure returned to baseline and stable Postop Assessment: no apparent nausea or vomiting Anesthetic complications: no   No notable events documented.  Last Vitals:  Vitals:   09/03/22 1024 09/03/22 1030  BP: 110/73 110/73  Pulse: 65 66  Resp: 20 18  Temp: (!) 36.4 C   SpO2: 92% 92%    Last Pain:  Vitals:   09/03/22 1030  TempSrc:   PainSc: 0-No pain                 Ayinde Swim P Marin Wisner

## 2022-09-03 NOTE — Anesthesia Procedure Notes (Signed)
Procedure Name: Intubation Date/Time: 09/03/2022 7:46 AM  Performed by: Timoteo Expose, CRNAPre-anesthesia Checklist: Patient identified, Emergency Drugs available, Suction available and Patient being monitored Patient Re-evaluated:Patient Re-evaluated prior to induction Oxygen Delivery Method: Circle system utilized Preoxygenation: Pre-oxygenation with 100% oxygen Induction Type: IV induction Ventilation: Mask ventilation without difficulty Laryngoscope Size: Mac and 4 Tube type: Oral Tube size: 7.5 mm Number of attempts: 1 Airway Equipment and Method: Stylet and Oral airway Placement Confirmation: ETT inserted through vocal cords under direct vision, positive ETCO2 and breath sounds checked- equal and bilateral Secured at: 22 cm Tube secured with: Tape Dental Injury: Teeth and Oropharynx as per pre-operative assessment

## 2022-09-03 NOTE — H&P (Signed)
CC: Here today for surgery  HPI: Charles Krueger is an 67 y.o. male with history of HTN, HLD, CKD, CAD, whom is seen in the office today as a referral by Dr. Regenia Skeeter for evaluation of symptomatic right inguinal hernia. He notes that about 3 months ago he first detected a bulge in his right groin. Initially, did not cause any discomfort. Over the last month or 2 he has had increasing symptoms of discomfort as well as a few trips to his primary doctor in 1 to the emergency room. On these events, he noted discomfort with the bulge that was initially irreducible. By time he sought care however, the bulge was soft and easy to be reduced. He denies any history of inguinal/groin surgery or abdominal surgery. Currently, he denies any groin pain. He denies any testicular pain.  CT A/P 06/11/22 - 1. Positive for a subtle Right Inguinal Hernia which Contains The Appendix. Although trace fluid or inflammatory stranding within the hernia sac, the appendix itself seems to remain normal.  2. Positive also for chronically abnormal pancreas: Enlarged and indistinct pancreatic head and uncinate with mild regional inflammation, and abnormal adjacent duodenum (Duodenitis or Peptic Ulcer Disease). Suspect recurrent Pancreatitis (query abnormal lipase), but underlying pancreatic mass is difficult to exclude. Follow-up Pancreas protocol Abdomen MRI (without and with contrast) is recommended as an outpatient to better characterize.  3. Superimposed bilateral scrotal hydroceles, simple fluid density.  4. Diverticulosis of the large bowel without active inflammation. No evidence of bowel obstruction.  5. Aortic Atherosclerosis (ICD10-I70.0).  INTERVAL HX Denies any changes in his health or health hx since we met in the office. States he is ready for surgery.   PMH: HTN, HLD, CKD, CAD  PSH: Denies any prior surgical history  FHx: Denies any known family history of colorectal, breast, endometrial or ovarian  cancer  Social Hx: Denies use of EtOH/illicit drug. Smokes 2 to 3 cigars/week. 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   Past Medical History:  Diagnosis Date   COPD (chronic obstructive pulmonary disease) ([place])    Diabetes mellitus without complication (HCC)    GERD (gastroesophageal reflux disease)    HTN (hypertension)    Myocardial infarction ([place])    silent Mi several years ago   OSA on CPAP    Pneumonia    pt reports was mentioned in 12/23 at [facility] visit   Sarcoidosis     Past Surgical History:  Procedure Laterality Date   RIGHT/LEFT HEART CATH AND CORONARY ANGIOGRAPHY N/A 12/14/2020   Procedure: RIGHT/LEFT HEART CATH AND CORONARY ANGIOGRAPHY;  Surgeon: Leonie Man, MD;  Location: [facility];  Service: Cardiovascular;  Laterality: N/A;    Family History  Problem Relation Age of Onset   CAD Mother     Social:  reports that he has been smoking cigars. He started smoking about 22 years ago. He has never used smokeless tobacco. He reports current alcohol use of about 6.0 - 7.0 standard drinks of alcohol per week. He reports that he does not use drugs.  Allergies:  Allergies  Allergen Reactions   Zestril [Lisinopril] Other (See Comments)    Acute renal failure    Medications: I have reviewed the patient's current medications.  Results for orders placed or performed during the hospital encounter of 09/03/22 (from the past 48 hour(s))  Glucose, capillary     Status: None   Collection Time: 09/03/22  6:10 AM  Result Value Ref Range  Glucose-Capillary 90 70 - 99 mg/dL    Comment: Glucose reference range applies only to samples taken after fasting for at least 8 hours.   Comment 1 Notify RN     No results found.   PE Blood pressure 98/63, pulse 65, temperature 98.1 F (36.7 C), temperature source Oral, resp. rate 18, height '5\' 9"'$  (1.753 m), weight 64.4 kg, SpO2 93 %. Constitutional: NAD; conversant Eyes: Moist conjunctiva CV:  RRR GI: Abd soft, NT/ND; Right groin marked for surgery Psychiatric: Appropriate affect  Results for orders placed or performed during the hospital encounter of 09/03/22 (from the past 48 hour(s))  Glucose, capillary     Status: None   Collection Time: 09/03/22  6:10 AM  Result Value Ref Range   Glucose-Capillary 90 70 - 99 mg/dL    Comment: Glucose reference range applies only to samples taken after fasting for at least 8 hours.   Comment 1 Notify RN     No results found.  A/P: Charles Krueger is an 67 y.o. male with hx of HTN, HLD, CKD, CAD here for surgery for symptomatic right inguinal hernia  -Cardiac clearance - [facility] - Dr. Marvel Plan  -Elective MRI abdomen - pancreas protocol to further evaluate pancreas head based on CT to rule out mass  -The anatomy and physiology of the GI tract and abdominal wall was reviewed with him with associated illustrations. The pathophysiology of hernias was covered as well. -The options for treatment were reviewed including further observation (with risk of worsening pain, incarceration of hernia, potential need for urgent/emergent procedures) vs surgery - open right inguinal hernia repair with mesh -The planned procedure, material risks (including, but not limited to, pain, bleeding, infection, scarring, need for blood transfusion, damage to surrounding structures-blood vessels/nerves/viscus/organs, need for additional procedures, recurrence, chronic pain, mesh complications including erosion into other structures/vessels/organs/viscus, worsening of pre-existing medical conditions, pneumonia, heart attack, stroke, death) benefits and alternatives to surgery were discussed at length. I noted a good probability that the procedure help improve their symptoms. The patient's questions were answered to his satisfaction, he voiced understanding and they elected to proceed with surgery. Additionally, we discussed typical postoperative expectations and the  recovery process.   Nadeen Landau, [facility], [place]

## 2022-09-03 NOTE — Op Note (Signed)
09/03/2022  8:48 AM  PATIENT:  Charles Krueger  67 y.o. male  Patient Care Team: [facility] as PCP - General Claib[facility] Billings Joyice Faster, MD as PCP - Cardiology (Cardiology)  PRE-OPERATIVE DIAGNOSIS:  Right inguinal hernia  POST-OPERATIVE DIAGNOSIS:  Right inguinal hernia, indirect  PROCEDURE:  Open right inguinal hernia repair with mesh  SURGEON:  Sharon Mt. Eleana Tocco, MD  ASSISTANT: OR staff  ANESTHESIA:  General endotracheal  COUNTS:  Sponge, needle and instrument counts were reported correct x2 at the conclusion of the operation.  EBL: 5 mL  DRAINS: None  SPECIMEN: None  FINDINGS: Indirect right inguinal hernia with empty hernia sac.  Small cord lipoma also removed.  [employer] features editor.  DESCRIPTION: The patient was identified in preop holding and taken to the OR where they were placed supine on the operating room table and SCDs were placed. General endotracheal anesthesia was induced without difficulty. Hair in the region of the planned incisions was clipped. The patient was then prepped and draped in the usual sterile fashion. A surgical timeout was performed indicating the correct patient, procedure, positioning and need for preoperative antibiotics.  0.25% Marcaine with epinephrine was used to anesthetize the skin over the mid-portion of the inguinal canal. An oblique incision was made. Dissection was carried down through the subcutaneous tissue with cautery to the external oblique fascia.  The external oblique fascia was opened along the direction of its fibers to the external ring.  The spermatic cord and hernia sac was circumferentially dissected bluntly and retracted with a Penrose drain.  The ilioinguinal nerve was identified and coursed across the area where his mesh would be laying.  This was therefore divided to hopefully reduce the risk of chronic inguinodynia.  The floor of the inguinal canal was inspected.  There is an apparent right indirect inguinal  hernia with an empty sac.  There is also a small associated cord lipoma.  The cord structures were all identified and then dissected. The hernia sac was identified, care being taken not to skeletonize the cord, this was dissected free from the cord structures. The hernia was already reduced and the sac empty. The empty sac was then carefully opened sharply at the distal end of the sac where it was thinnest. High ligation of the sac was performed under direct visualization and the sac suture ligated with a 2-0 Vicryl suture.  A small cord lipoma is also dissected free of the hernia sac and cord structures.  This is then ligated and divided.  A piece of light weight prolene mesh was cut to fit the inguinal floor and deployed. The mesh was secured to the pubic tubercle using two 0 Ethibond sutures - ensuring that there was 2 cm of overlap between the mesh and the pubic tubercle. The mesh was then secured using additional 0 Ethibond suture to the shelving edge of the inguinal ligament inferiorly and the conjoined tendon superiorly. The mesh was tucked underneath the external oblique fascia laterally.  The tails of the mesh were then closed around the spermatic cord to recreate the internal inguinal ring and this ring was secured using 0 Ethibond suture.  The external oblique fascia was reapproximated with 2-0 Vicryl.  3-0 Vicryl was then used to close Scarpa's fascia. A running 4-0 Monocryl subcuticular suture was used to close the skin.  Dermabond was the applied. The patient was awakened from general anesthesia, extubated and taken to the recovery in satisfactory condition.  DISPOSITION: PACU in satisfactory condition

## 2022-09-04 ENCOUNTER — Encounter (HOSPITAL_COMMUNITY): Payer: Self-pay | Admitting: Surgery

## 2022-12-19 ENCOUNTER — Encounter (HOSPITAL_COMMUNITY): Payer: Self-pay

## 2022-12-19 ENCOUNTER — Ambulatory Visit (HOSPITAL_COMMUNITY)
Admission: EM | Admit: 2022-12-19 | Discharge: 2022-12-19 | Disposition: A | Discharge status: Home / Self Care | Payer: No Typology Code available for payment source | Attending: Emergency Medicine | Admitting: Emergency Medicine

## 2022-12-19 DIAGNOSIS — R040 Epistaxis: Secondary | ICD-10-CM

## 2022-12-19 MED ORDER — OXYMETAZOLINE HCL 0.05 % NA SOLN
NASAL | Status: AC
  Filled 2022-12-19: qty 30

## 2022-12-19 MED ORDER — AZITHROMYCIN 250 MG PO TABS: 250.0000 mg | ORAL_TABLET | Freq: Every day | ORAL | 0 refills | Status: DC

## 2022-12-19 MED ORDER — LIDOCAINE-EPINEPHRINE 1 %-1:100000 IJ SOLN
INTRAMUSCULAR | Status: AC
  Filled 2022-12-19: qty 1

## 2022-12-19 NOTE — ED Triage Notes (Signed)
Pt c/o intermittent nosebleed since Sunday to rt nare. States hx of sinusitis and been using his haler and nose spray daily prior to this.

## 2022-12-19 NOTE — Discharge Instructions (Addendum)
Today you were evaluated and treated for your nose bleed  To ensure that your sinusitis is not affecting a the cause of your nosebleeds we will prophylactically place you on antibiotics, take azithromycin as directed  May continue the use of daily prescribed nasal spray  To help keep the nose from becoming dry nose every morning and every evening with Vaseline or similar product   Nasal spray that you have been given may be used as needed for nosebleeds, spray 2-3 times within the nose and hold pressure for at least 10 to 15 minutes, may repeat an additional 2 times, if nosebleeds persist past 30 minutes please return to the clinic for reevaluation  Ensure your home is not  warm stuffy or dry this may cause nosebleeds to continue her prolonged, you may use a humidifier within your home, if you do not have one, correct the window or run a fan

## 2022-12-19 NOTE — ED Provider Notes (Signed)
[facility]    CSN: [id number] Arrival date & time: 12/19/22  1647      History   Chief Complaint Chief Complaint  Patient presents with   Epistaxis    HPI Charles Krueger is a 67 y.o. male.   Presents for evaluation of a right-sided nosebleed that has been reoccurring for the last 3 days.  Most recent occurrence prior to arrival.  Has been holding pressure which alleviates symptoms but has not evaluation due to reoccurrence.  Endorses use daily nasal spray for history of sinusitis, denies increased sinus pressure, congestion, ear pain, sore throat fevers.  Denies use of blood thinners.  Denies injury or trauma to the nose.  Past Medical History:  Diagnosis Date   COPD (chronic obstructive pulmonary disease) (HCC)    Diabetes mellitus without complication (HCC)    GERD (gastroesophageal reflux disease)    HTN (hypertension)    Myocardial infarction (HCC)    silent Mi several years ago   OSA on CPAP    Pneumonia    pt reports was mentioned in 12/23 at [facility] visit   Sarcoidosis     Patient Active Problem List   Diagnosis Date Noted   Acute pancreatitis 04/19/2022   Hypoxemia 04/19/2022   Chronic combined systolic and diastolic heart failure (HCC) 12/30/2020   Cirrhosis (HCC)    Dilated cardiomyopathy (HCC)    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 12/09/2020   Acute exacerbation of CHF (congestive heart failure) (HCC) 12/08/2020   Anemia 12/08/2020   Mediastinal lymphadenopathy 12/08/2020   COPD (chronic obstructive pulmonary disease) (HCC) 12/08/2020   OSA (obstructive sleep apnea) 12/08/2020   Diabetes mellitus (HCC) 12/08/2020   CAD (coronary artery disease) 12/08/2020   Hyperlipidemia 12/08/2020   Stage 3b chronic kidney disease (HCC) 10/13/2020   Pulmonary sarcoidosis (HCC) 06/25/2006    Past Surgical History:  Procedure Laterality Date   INGUINAL HERNIA REPAIR Right 09/03/2022   Procedure: OPEN RIGHT INGUINAL HERNIA REPAIR WITH MESH;   Surgeon: Andria Meuse, MD;  Location: [facility];  Service: General;  Laterality: Right;   RIGHT/LEFT HEART CATH AND CORONARY ANGIOGRAPHY N/A 12/14/2020   Procedure: RIGHT/LEFT HEART CATH AND CORONARY ANGIOGRAPHY;  Surgeon: Marykay Lex, MD;  Location: [facility];  Service: Cardiovascular;  Laterality: N/A;       Home Medications    Prior to Admission medications   Medication Sig Start Date End Date Taking? Authorizing Provider  azithromycin (ZITHROMAX) 250 MG tablet Take 1 tablet (250 mg total) by mouth daily. Take first 2 tablets together, then 1 every day until finished. 12/19/22  Yes Mc Hollen R, NP  albuterol (VENTOLIN HFA) 108 (90 Base) MCG/ACT inhaler Inhale 2 puffs into the lungs every 6 (six) hours as needed for wheezing or shortness of breath.    [provider]  allopurinol (ZYLOPRIM) 100 MG tablet Take 50 mg by mouth daily.    [provider]  aspirin 81 MG tablet Take 162 mg by mouth at bedtime.    [provider]  atorvastatin (LIPITOR) 40 MG tablet Take 1 tablet (40 mg total) by mouth daily. 01/09/21   [name], NP  buPROPion (WELLBUTRIN XL) 150 MG 24 hr tablet Take 150 mg by mouth at bedtime. 07/03/22   [provider]  carvedilol (COREG) 25 MG tablet Take 25 mg by mouth 2 (two) times daily with a meal.    [provider]  empagliflozin (JARDIANCE) 10 MG TABS tablet Take 1  tablet (10 mg total) by mouth daily. 12/23/20   [name], MD  fluticasone (FLONASE) 50 MCG/ACT nasal spray Place 1 spray into both nostrils 2 (two) times daily.    [provider]  hydrALAZINE (APRESOLINE) 50 MG tablet Take 1.5 tablets (75 mg total) by mouth 3 (three) times daily. Patient taking differently: Take 50 mg by mouth 3 (three) times daily. 05/12/21   [name], MD  ipratropium (ATROVENT) 0.03 % nasal spray Place 2 sprays into both nostrils daily. 07/18/21   [provider]  isosorbide mononitrate  (IMDUR) 30 MG 24 hr tablet Take 1 tablet (30 mg total) by mouth daily. 03/14/21   [name], [name], [name]  ketotifen (ZADITOR) 0.025 % ophthalmic solution Place 1 drop into both eyes 2 (two) times daily as needed (itchy eyes).    [provider]  montelukast (SINGULAIR) 10 MG tablet Take 10 mg by mouth daily.    [provider]  Multiple Vitamin (MULTIVITAMIN WITH MINERALS) TABS tablet Take 1 tablet by mouth daily.    [provider]  Omega-3 Fatty Acids (FISH OIL) 1200 MG CAPS Take 1,200 mg by mouth daily.    [provider]  sacubitril-valsartan (ENTRESTO) 97-103 MG Take 1 tablet by mouth 2 times daily. 01/19/21   [name], [name]  spironolactone (ALDACTONE) 25 MG tablet Take 1 tablet (25 mg total) by mouth daily. 02/13/21   [name], MD  Tiotropium Bromide-Olodaterol (STIOLTO RESPIMAT) 2.5-2.5 MCG/ACT AERS Inhale 2 puffs into the lungs daily.    [provider]    Family History Family History  Problem Relation Age of Onset   CAD Mother     Social History Social History   Tobacco Use   Smoking status: Some Days    Types: Cigars    Start date: 2002   Smokeless tobacco: Never   Tobacco comments:    3-4 Cigars per week  Vaping Use   Vaping Use: Never used  Substance Use Topics   Alcohol use: Yes    Alcohol/week: 6.0 - 7.0 standard drinks of alcohol    Types: 6 - 7 Shots of liquor per week    Comment: occAs   Drug use: No     Allergies   Zestril [lisinopril]   Review of Systems Review of Systems  HENT:  Positive for nosebleeds.      Physical Exam Triage Vital Signs ED Triage Vitals [12/19/22 1700]  Enc Vitals Group     BP (!) 148/83     Pulse Rate 69     Resp 18     Temp 99 F (37.2 C)     Temp Source Oral     SpO2 95 %     Weight      Height      Head Circumference      Peak Flow      Pain Score 0     Pain Loc      Pain Edu?      Excl. in GC?    No data found.  Updated Vital Signs BP (!)  148/83 (BP Location: Left Arm)   Pulse 69   Temp 99 F (37.2 C) (Oral)   Resp 18   SpO2 95%   Visual Acuity Right Eye Distance:   Left Eye Distance:   Bilateral Distance:    Right Eye Near:   Left Eye Near:    Bilateral Near:     Physical Exam Constitutional:  Appearance: Normal appearance.  HENT:     Nose:     Right Nostril: Epistaxis present.  Eyes:     Extraocular Movements: Extraocular movements intact.  Neurological:     Mental Status: He is alert and oriented to person, place, and time. Mental status is at baseline.      UC Treatments / Results  Labs (all labs ordered are listed, but only abnormal results are displayed) Labs Reviewed - No data to display  EKG   Radiology No results found.  Procedures Procedures (including critical care time)  Medications Ordered in UC Medications - No data to display  Initial Impression / Assessment and Plan / UC Course  I have reviewed the triage vital signs and the nursing notes.  Pertinent labs & imaging results that were available during my care of the patient were reviewed by me and considered in my medical decision making (see chart for details).  Anterior epistasis  Active bleeding present to the right nasal turbinate, 2 rounds of Afrin given in the office with manual pressure applied, bleeding has slowed but recurred after 5 minutes, gauze soaked with lidocaine and epinephrine placed within the nasal turbinate and patient monitored for additional 15 minutes, after removal bleeding has subsided, may continue use of Flonase at home, advised against nasal wall to movement further irritation, azithromycin pack prescribed prophylactically to ensure sinusitis is not affecting symptoms, advised keeping the area moisturized with petroleum or similar product, given Afrin for use at home and discussed administration, given strict precautions that after 3 attempts of Afrin or bleeding that persist past 30 minutes he is to  go to the nearest emergency department for immediate evaluation Final Clinical Impressions(s) / UC Diagnoses   Final diagnoses:  Anterior epistaxis     Discharge Instructions      Today you were evaluated and treated for your nose bleed  To ensure that your sinusitis is not affecting a the cause of your nosebleeds we will prophylactically place you on antibiotics, take azithromycin as directed  May continue the use of daily prescribed nasal spray  To help keep the nose from becoming dry nose every morning and every evening with Vaseline or similar product   Nasal spray that you have been given may be used as needed for nosebleeds, spray 2-3 times within the nose and hold pressure for at least 10 to 15 minutes, may repeat an additional 2 times, if nosebleeds persist past 30 minutes please return to the clinic for reevaluation  Ensure your home is not  warm stuffy or dry this may cause nosebleeds to continue her prolonged, you may use a humidifier within your home, if you do not have one, correct the window or run a fan     ED Prescriptions     Medication Sig Dispense Auth. Provider   azithromycin (ZITHROMAX) 250 MG tablet Take 1 tablet (250 mg total) by mouth daily. Take first 2 tablets together, then 1 every day until finished. 6 tablet Valinda Hoar, NP      PDMP not reviewed this encounter.   Valinda Hoar, [place] 12/19/22 [phone]

## 2023-02-11 ENCOUNTER — Inpatient Hospital Stay (HOSPITAL_COMMUNITY)
Admission: EM | Admit: 2023-02-11 | Discharge: 2023-02-13 | DRG: 919 | Disposition: A | Discharge status: Home / Self Care | Payer: No Typology Code available for payment source | Attending: Internal Medicine | Admitting: Internal Medicine

## 2023-02-11 ENCOUNTER — Other Ambulatory Visit: Payer: Self-pay

## 2023-02-11 ENCOUNTER — Encounter (HOSPITAL_COMMUNITY): Payer: Self-pay

## 2023-02-11 DIAGNOSIS — I130 Hypertensive heart and chronic kidney disease with heart failure and stage 1 through stage 4 chronic kidney disease, or unspecified chronic kidney disease: Secondary | ICD-10-CM | POA: Diagnosis present

## 2023-02-11 DIAGNOSIS — R571 Hypovolemic shock: Secondary | ICD-10-CM | POA: Diagnosis present

## 2023-02-11 DIAGNOSIS — F17290 Nicotine dependence, other tobacco product, uncomplicated: Secondary | ICD-10-CM | POA: Diagnosis present

## 2023-02-11 DIAGNOSIS — Y838 Other surgical procedures as the cause of abnormal reaction of the patient, or of later complication, without mention of misadventure at the time of the procedure: Secondary | ICD-10-CM | POA: Diagnosis present

## 2023-02-11 DIAGNOSIS — E875 Hyperkalemia: Secondary | ICD-10-CM | POA: Diagnosis present

## 2023-02-11 DIAGNOSIS — D6489 Other specified anemias: Secondary | ICD-10-CM

## 2023-02-11 DIAGNOSIS — I5022 Chronic systolic (congestive) heart failure: Secondary | ICD-10-CM

## 2023-02-11 DIAGNOSIS — K922 Gastrointestinal hemorrhage, unspecified: Secondary | ICD-10-CM | POA: Diagnosis present

## 2023-02-11 DIAGNOSIS — I5042 Chronic combined systolic (congestive) and diastolic (congestive) heart failure: Secondary | ICD-10-CM | POA: Diagnosis present

## 2023-02-11 DIAGNOSIS — Z8719 Personal history of other diseases of the digestive system: Secondary | ICD-10-CM | POA: Diagnosis not present

## 2023-02-11 DIAGNOSIS — I2510 Atherosclerotic heart disease of native coronary artery without angina pectoris: Secondary | ICD-10-CM | POA: Diagnosis present

## 2023-02-11 DIAGNOSIS — N179 Acute kidney failure, unspecified: Secondary | ICD-10-CM | POA: Diagnosis present

## 2023-02-11 DIAGNOSIS — K91840 Postprocedural hemorrhage and hematoma of a digestive system organ or structure following a digestive system procedure: Secondary | ICD-10-CM | POA: Diagnosis present

## 2023-02-11 DIAGNOSIS — G4733 Obstructive sleep apnea (adult) (pediatric): Secondary | ICD-10-CM | POA: Diagnosis present

## 2023-02-11 DIAGNOSIS — I252 Old myocardial infarction: Secondary | ICD-10-CM | POA: Diagnosis not present

## 2023-02-11 DIAGNOSIS — T8119XA Other postprocedural shock, initial encounter: Secondary | ICD-10-CM

## 2023-02-11 DIAGNOSIS — E861 Hypovolemia: Secondary | ICD-10-CM | POA: Diagnosis present

## 2023-02-11 DIAGNOSIS — Z7984 Long term (current) use of oral hypoglycemic drugs: Secondary | ICD-10-CM | POA: Diagnosis not present

## 2023-02-11 DIAGNOSIS — K219 Gastro-esophageal reflux disease without esophagitis: Secondary | ICD-10-CM | POA: Diagnosis present

## 2023-02-11 DIAGNOSIS — Z8249 Family history of ischemic heart disease and other diseases of the circulatory system: Secondary | ICD-10-CM | POA: Diagnosis not present

## 2023-02-11 DIAGNOSIS — K633 Ulcer of intestine: Secondary | ICD-10-CM | POA: Diagnosis present

## 2023-02-11 DIAGNOSIS — J449 Chronic obstructive pulmonary disease, unspecified: Secondary | ICD-10-CM | POA: Diagnosis present

## 2023-02-11 DIAGNOSIS — D869 Sarcoidosis, unspecified: Secondary | ICD-10-CM | POA: Diagnosis present

## 2023-02-11 DIAGNOSIS — K5731 Diverticulosis of large intestine without perforation or abscess with bleeding: Secondary | ICD-10-CM | POA: Diagnosis present

## 2023-02-11 DIAGNOSIS — E1122 Type 2 diabetes mellitus with diabetic chronic kidney disease: Secondary | ICD-10-CM | POA: Diagnosis present

## 2023-02-11 DIAGNOSIS — D62 Acute posthemorrhagic anemia: Secondary | ICD-10-CM | POA: Diagnosis present

## 2023-02-11 DIAGNOSIS — E871 Hypo-osmolality and hyponatremia: Secondary | ICD-10-CM | POA: Diagnosis present

## 2023-02-11 DIAGNOSIS — E872 Acidosis, unspecified: Secondary | ICD-10-CM | POA: Diagnosis present

## 2023-02-11 DIAGNOSIS — Z79899 Other long term (current) drug therapy: Secondary | ICD-10-CM

## 2023-02-11 DIAGNOSIS — K921 Melena: Secondary | ICD-10-CM

## 2023-02-11 DIAGNOSIS — R578 Other shock: Secondary | ICD-10-CM | POA: Diagnosis present

## 2023-02-11 DIAGNOSIS — N1832 Chronic kidney disease, stage 3b: Secondary | ICD-10-CM | POA: Diagnosis present

## 2023-02-11 DIAGNOSIS — K5730 Diverticulosis of large intestine without perforation or abscess without bleeding: Secondary | ICD-10-CM | POA: Diagnosis not present

## 2023-02-11 LAB — CBC WITH DIFFERENTIAL/PLATELET
Abs Immature Granulocytes: 0.06 10*3/uL (ref 0.00–0.07)
Basophils Absolute: 0 10*3/uL (ref 0.0–0.1)
Basophils Relative: 0 %
Eosinophils Absolute: 0 10*3/uL (ref 0.0–0.5)
Eosinophils Relative: 0 %
HCT: 16.4 % — ABNORMAL LOW (ref 39.0–52.0)
Hemoglobin: 5.3 g/dL — CL (ref 13.0–17.0)
Immature Granulocytes: 1 %
Lymphocytes Relative: 29 %
Lymphs Abs: 2.9 10*3/uL (ref 0.7–4.0)
MCH: 34.6 pg — ABNORMAL HIGH (ref 26.0–34.0)
MCHC: 32.3 g/dL (ref 30.0–36.0)
MCV: 107.2 fL — ABNORMAL HIGH (ref 80.0–100.0)
Monocytes Absolute: 0.9 10*3/uL (ref 0.1–1.0)
Monocytes Relative: 9 %
Neutro Abs: 6.3 10*3/uL (ref 1.7–7.7)
Neutrophils Relative %: 61 %
Platelets: 158 10*3/uL (ref 150–400)
RBC: 1.53 MIL/uL — ABNORMAL LOW (ref 4.22–5.81)
RDW: 14.5 % (ref 11.5–15.5)
WBC: 10.2 10*3/uL (ref 4.0–10.5)
nRBC: 0 % (ref 0.0–0.2)

## 2023-02-11 LAB — COMPREHENSIVE METABOLIC PANEL
ALT: 12 U/L (ref 0–44)
AST: 11 U/L — ABNORMAL LOW (ref 15–41)
Albumin: 2.9 g/dL — ABNORMAL LOW (ref 3.5–5.0)
Alkaline Phosphatase: 57 U/L (ref 38–126)
Anion gap: 9 (ref 5–15)
BUN: 50 mg/dL — ABNORMAL HIGH (ref 8–23)
CO2: 13 mmol/L — ABNORMAL LOW (ref 22–32)
Calcium: 9.1 mg/dL (ref 8.9–10.3)
Chloride: 110 mmol/L (ref 98–111)
Creatinine, Ser: 2.91 mg/dL — ABNORMAL HIGH (ref 0.61–1.24)
GFR, Estimated: 23 mL/min — ABNORMAL LOW (ref >60)
Glucose, Bld: 139 mg/dL — ABNORMAL HIGH (ref 70–99)
Potassium: 5.7 mmol/L — ABNORMAL HIGH (ref 3.5–5.1)
Sodium: 132 mmol/L — ABNORMAL LOW (ref 135–145)
Total Bilirubin: 0.5 mg/dL (ref 0.3–1.2)
Total Protein: 5 g/dL — ABNORMAL LOW (ref 6.5–8.1)

## 2023-02-11 LAB — BASIC METABOLIC PANEL
Anion gap: 6 (ref 5–15)
BUN: 44 mg/dL — ABNORMAL HIGH (ref 8–23)
CO2: 14 mmol/L — ABNORMAL LOW (ref 22–32)
Calcium: 9 mg/dL (ref 8.9–10.3)
Chloride: 117 mmol/L — ABNORMAL HIGH (ref 98–111)
Creatinine, Ser: 2.52 mg/dL — ABNORMAL HIGH (ref 0.61–1.24)
GFR, Estimated: 27 mL/min — ABNORMAL LOW (ref >60)
Glucose, Bld: 109 mg/dL — ABNORMAL HIGH (ref 70–99)
Potassium: 5.1 mmol/L (ref 3.5–5.1)
Sodium: 137 mmol/L (ref 135–145)

## 2023-02-11 LAB — CBC
HCT: 20.1 % — ABNORMAL LOW (ref 39.0–52.0)
Hemoglobin: 6.7 g/dL — CL (ref 13.0–17.0)
MCH: 34 pg (ref 26.0–34.0)
MCHC: 33.3 g/dL (ref 30.0–36.0)
MCV: 102 fL — ABNORMAL HIGH (ref 80.0–100.0)
Platelets: 139 10*3/uL — ABNORMAL LOW (ref 150–400)
RBC: 1.97 MIL/uL — ABNORMAL LOW (ref 4.22–5.81)
RDW: 15 % (ref 11.5–15.5)
WBC: 10.7 10*3/uL — ABNORMAL HIGH (ref 4.0–10.5)
nRBC: 0 % (ref 0.0–0.2)

## 2023-02-11 LAB — LACTIC ACID, PLASMA
Lactic Acid, Venous: 1.5 mmol/L (ref 0.5–1.9)
Lactic Acid, Venous: 1.6 mmol/L (ref 0.5–1.9)

## 2023-02-11 LAB — PROTIME-INR
INR: 1.2 (ref 0.8–1.2)
Prothrombin Time: 15.3 seconds — ABNORMAL HIGH (ref 11.4–15.2)

## 2023-02-11 LAB — GLUCOSE, CAPILLARY
Glucose-Capillary: 103 mg/dL — ABNORMAL HIGH (ref 70–99)
Glucose-Capillary: 109 mg/dL — ABNORMAL HIGH (ref 70–99)

## 2023-02-11 LAB — ABO/RH: ABO/RH(D): B POS

## 2023-02-11 MED ORDER — UMECLIDINIUM BROMIDE 62.5 MCG/ACT IN AEPB
1.0000 | INHALATION_SPRAY | Freq: Every day | RESPIRATORY_TRACT | Status: DC
  Filled 2023-02-11: qty 7

## 2023-02-11 MED ORDER — ARFORMOTEROL TARTRATE 15 MCG/2ML IN NEBU
15.0000 ug | INHALATION_SOLUTION | Freq: Two times a day (BID) | RESPIRATORY_TRACT | Status: DC
  Administered 2023-02-11 – 2023-02-13 (×3): 15 ug via RESPIRATORY_TRACT
  Filled 2023-02-11 (×4): qty 2

## 2023-02-11 MED ORDER — DOCUSATE SODIUM 100 MG PO CAPS: 100.0000 mg | ORAL_CAPSULE | Freq: Two times a day (BID) | ORAL | Status: DC | PRN

## 2023-02-11 MED ORDER — PEG-KCL-NACL-NASULF-NA ASC-C 100 G PO SOLR
0.5000 | Freq: Once | ORAL | Status: AC
  Administered 2023-02-12: 100 g via ORAL

## 2023-02-11 MED ORDER — ALBUTEROL SULFATE (2.5 MG/3ML) 0.083% IN NEBU: 2.5000 mg | INHALATION_SOLUTION | RESPIRATORY_TRACT | Status: DC | PRN

## 2023-02-11 MED ORDER — TRANEXAMIC ACID-NACL 1000-0.7 MG/100ML-% IV SOLN
1000.0000 mg | Freq: Once | INTRAVENOUS | Status: AC
  Administered 2023-02-11: 1000 mg via INTRAVENOUS
  Filled 2023-02-11: qty 100

## 2023-02-11 MED ORDER — POLYETHYLENE GLYCOL 3350 17 G PO PACK: 17.0000 g | PACK | Freq: Every day | ORAL | Status: DC | PRN

## 2023-02-11 MED ORDER — SODIUM CHLORIDE 0.9 % IV BOLUS
1000.0000 mL | Freq: Once | INTRAVENOUS | Status: AC
  Administered 2023-02-11: 1000 mL via INTRAVENOUS

## 2023-02-11 MED ORDER — ONDANSETRON HCL 4 MG/2ML IJ SOLN: 4.0000 mg | Freq: Four times a day (QID) | INTRAMUSCULAR | Status: DC | PRN

## 2023-02-11 MED ORDER — PEG-KCL-NACL-NASULF-NA ASC-C 100 G PO SOLR: 1.0000 | Freq: Once | ORAL | Status: DC

## 2023-02-11 MED ORDER — INSULIN ASPART 100 UNIT/ML IJ SOLN: 0.0000 [IU] | INTRAMUSCULAR | Status: DC

## 2023-02-11 MED ORDER — SODIUM CHLORIDE 0.9% IV SOLUTION: Freq: Once | INTRAVENOUS | Status: AC

## 2023-02-11 MED ORDER — SODIUM CHLORIDE 0.9 % IV SOLN: INTRAVENOUS | Status: DC

## 2023-02-11 MED ORDER — PANTOPRAZOLE SODIUM 40 MG IV SOLR
40.0000 mg | Freq: Two times a day (BID) | INTRAVENOUS | Status: DC
  Administered 2023-02-11 – 2023-02-13 (×5): 40 mg via INTRAVENOUS
  Filled 2023-02-11 (×5): qty 10

## 2023-02-11 MED ORDER — REVEFENACIN 175 MCG/3ML IN SOLN: 175.0000 ug | Freq: Every day | RESPIRATORY_TRACT | Status: DC

## 2023-02-11 MED ORDER — PEG-KCL-NACL-NASULF-NA ASC-C 100 G PO SOLR
0.5000 | Freq: Once | ORAL | Status: AC
  Administered 2023-02-11: 100 g via ORAL
  Filled 2023-02-11 (×2): qty 1

## 2023-02-11 MED ORDER — REVEFENACIN 175 MCG/3ML IN SOLN
175.0000 ug | Freq: Every day | RESPIRATORY_TRACT | Status: DC
  Administered 2023-02-13: 175 ug via RESPIRATORY_TRACT
  Filled 2023-02-11 (×2): qty 3

## 2023-02-11 NOTE — H&P (View-Only) (Signed)
Consultation  Referring Provider:  [facility]  Primary Care Physician:  [facility]       Reason for Consultation:   GI bleed  LOS: 0 days          HPI:   Charles Krueger is a 68 y.o. male with past medical history significant for CAD with MI, COPD, OSA (on CPAP), sarcoidosis, history of colon polyps, presents for evaluation of rectal bleeding.  Patient reports he underwent colonoscopy with the [facility] Wednesday, 02/06/2023 in which she had 2 small polyps and 1 large polyp removed.  Prior to this his last colonoscopy was 1.5 years ago when he had 9 polyps removed.  He states between yesterday and today he has had 4 episodes of dark red rectal bleeding, large volume.  Reports associated shortness of breath, chest pain, and dizziness as well.  No previous episodes of GI bleeding.  Denies abdominal pain, nausea, vomiting.  Upon arrival he is hypotensive with BP 70s/50, heart rate 83.  Hgb 5.3 (baseline 14).  Platelets 158.  BUN 50, creatinine 2.91.  States his last episode of bleeding was this morning.  PCCM was consulted and patient is being admitted to ICU  Past Medical History:  Diagnosis Date  . COPD (chronic obstructive pulmonary disease) (HCC)   . Diabetes mellitus without complication (HCC)   . GERD (gastroesophageal reflux disease)   . HTN (hypertension)   . Myocardial infarction (HCC)    silent Mi several years ago  . OSA on CPAP   . Pneumonia    pt reports was mentioned in 12/23 at [facility] visit  . Sarcoidosis     Surgical History:  He  has a past surgical history that includes RIGHT/LEFT HEART CATH AND CORONARY ANGIOGRAPHY (N/A, 12/14/2020) and Inguinal hernia repair (Right, 09/03/2022). Family History:  His family history includes CAD in his mother. Social History:   reports that he has been smoking cigars. He started smoking about 22 years ago. He has never used smokeless tobacco. He reports current alcohol use of about 6.0 -  7.0 standard drinks of alcohol per week. He reports that he does not use drugs.  Prior to Admission medications   Medication Sig Start Date End Date Taking? Authorizing Provider  albuterol (VENTOLIN HFA) 108 (90 Base) MCG/ACT inhaler Inhale 2 puffs into the lungs every 6 (six) hours as needed for wheezing or shortness of breath.    [provider]  allopurinol (ZYLOPRIM) 100 MG tablet Take 50 mg by mouth daily.    [provider]  aspirin 81 MG tablet Take 162 mg by mouth at bedtime.    [provider]  atorvastatin (LIPITOR) 40 MG tablet Take 1 tablet (40 mg total) by mouth daily. 01/09/21   [name], NP  azithromycin (ZITHROMAX) 250 MG tablet Take 1 tablet (250 mg total) by mouth daily. Take first 2 tablets together, then 1 every day until finished. 12/19/22   Valinda Hoar, NP  buPROPion (WELLBUTRIN XL) 150 MG 24 hr tablet Take 150 mg by mouth at bedtime. 07/03/22   [provider]  carvedilol (COREG) 25 MG tablet Take 25 mg by mouth 2 (two) times daily with a meal.    [provider]  empagliflozin (JARDIANCE) 10 MG TABS tablet Take 1 tablet (10 mg total) by mouth daily. 12/23/20   [name], MD  fluticasone (FLONASE) 50 MCG/ACT nasal spray Place 1 spray into both nostrils 2 (two)  times daily.    [provider]  hydrALAZINE (APRESOLINE) 50 MG tablet Take 1.5 tablets (75 mg total) by mouth 3 (three) times daily. Patient taking differently: Take 50 mg by mouth 3 (three) times daily. 05/12/21   [name], MD  ipratropium (ATROVENT) 0.03 % nasal spray Place 2 sprays into both nostrils daily. 07/18/21   [provider]  isosorbide mononitrate (IMDUR) 30 MG 24 hr tablet Take 1 tablet (30 mg total) by mouth daily. 03/14/21   [name], [name], [name]  ketotifen (ZADITOR) 0.025 % ophthalmic solution Place 1 drop into both eyes 2 (two) times daily as needed (itchy eyes).    [provider]  montelukast  (SINGULAIR) 10 MG tablet Take 10 mg by mouth daily.    [provider]  Multiple Vitamin (MULTIVITAMIN WITH MINERALS) TABS tablet Take 1 tablet by mouth daily.    [provider]  Omega-3 Fatty Acids (FISH OIL) 1200 MG CAPS Take 1,200 mg by mouth daily.    [provider]  sacubitril-valsartan (ENTRESTO) 97-103 MG Take 1 tablet by mouth 2 times daily. 01/19/21   [name], [name]  spironolactone (ALDACTONE) 25 MG tablet Take 1 tablet (25 mg total) by mouth daily. 02/13/21   [name], MD  Tiotropium Bromide-Olodaterol (STIOLTO RESPIMAT) 2.5-2.5 MCG/ACT AERS Inhale 2 puffs into the lungs daily.    [provider]    Current Facility-Administered Medications  Medication Dose Route Frequency Provider Last Rate Last Admin  . 0.9 %  sodium chloride infusion (Manually program via Guardrails IV Fluids)   Intravenous Once Bethann Berkshire, MD      . docusate sodium (COLACE) capsule 100 mg  100 mg Oral BID PRN Cloyd Stagers M, PA-C      . insulin aspart (novoLOG) injection 0-9 Units  0-9 Units Subcutaneous Q4H Cloyd Stagers M, PA-C      . ondansetron [facility]) injection 4 mg  4 mg Intravenous Q6H PRN Cloyd Stagers M, PA-C      . pantoprazole (PROTONIX) injection 40 mg  40 mg Intravenous Q12H Cloyd Stagers M, PA-C      . polyethylene glycol (MIRALAX / GLYCOLAX) packet 17 g  17 g Oral Daily PRN Tim Lair, PA-C       Current Outpatient Medications  Medication Sig Dispense Refill  . albuterol (VENTOLIN HFA) 108 (90 Base) MCG/ACT inhaler Inhale 2 puffs into the lungs every 6 (six) hours as needed for wheezing or shortness of breath.    . allopurinol (ZYLOPRIM) 100 MG tablet Take 50 mg by mouth daily.    Marland Kitchen aspirin 81 MG tablet Take 162 mg by mouth at bedtime.    Marland Kitchen atorvastatin (LIPITOR) 40 MG tablet Take 1 tablet (40 mg total) by mouth daily. 30 tablet 1  . azithromycin (ZITHROMAX) 250 MG tablet Take 1 tablet (250 mg total) by mouth daily. Take  first 2 tablets together, then 1 every day until finished. 6 tablet 0  . buPROPion (WELLBUTRIN XL) 150 MG 24 hr tablet Take 150 mg by mouth at bedtime.    . carvedilol (COREG) 25 MG tablet Take 25 mg by mouth 2 (two) times daily with a meal.    . empagliflozin (JARDIANCE) 10 MG TABS tablet Take 1 tablet (10 mg total) by mouth daily. 90 tablet 3  . fluticasone (FLONASE) 50 MCG/ACT nasal spray Place 1 spray into both nostrils 2 (two) times daily.    . hydrALAZINE (APRESOLINE) 50 MG tablet Take 1.5 tablets (75  mg total) by mouth 3 (three) times daily. (Patient taking differently: Take 50 mg by mouth 3 (three) times daily.) 270 tablet 3  . ipratropium (ATROVENT) 0.03 % nasal spray Place 2 sprays into both nostrils daily.    . isosorbide mononitrate (IMDUR) 30 MG 24 hr tablet Take 1 tablet (30 mg total) by mouth daily. 90 tablet 3  . ketotifen (ZADITOR) 0.025 % ophthalmic solution Place 1 drop into both eyes 2 (two) times daily as needed (itchy eyes).    . montelukast (SINGULAIR) 10 MG tablet Take 10 mg by mouth daily.    . Multiple Vitamin (MULTIVITAMIN WITH MINERALS) TABS tablet Take 1 tablet by mouth daily.    . Omega-3 Fatty Acids (FISH OIL) 1200 MG CAPS Take 1,200 mg by mouth daily.    . sacubitril-valsartan (ENTRESTO) 97-103 MG Take 1 tablet by mouth 2 times daily. 180 tablet 3  . spironolactone (ALDACTONE) 25 MG tablet Take 1 tablet (25 mg total) by mouth daily. 90 tablet 3  . Tiotropium Bromide-Olodaterol (STIOLTO RESPIMAT) 2.5-2.5 MCG/ACT AERS Inhale 2 puffs into the lungs daily.      Allergies as of 02/11/2023 - Review Complete 02/11/2023  Allergen Reaction Noted  . Zestril [lisinopril] Other (See Comments)     Review of Systems  Constitutional:  Negative for chills, fever, malaise/fatigue and weight loss.  HENT:  Negative for hearing loss and tinnitus.   Eyes:  Negative for blurred vision and double vision.  Respiratory:  Positive for shortness of breath. Negative for cough.    Cardiovascular:  Positive for chest pain. Negative for palpitations.  Gastrointestinal:  Positive for blood in stool. Negative for abdominal pain, constipation, diarrhea, heartburn, melena, nausea and vomiting.  Genitourinary:  Negative for dysuria and urgency.  Musculoskeletal:  Negative for myalgias and neck pain.  Skin:  Negative for itching and rash.  Neurological:  Positive for weakness. Negative for seizures and loss of consciousness.  Psychiatric/Behavioral:  Negative for depression and suicidal ideas.        Physical Exam:  Vital signs in last 24 hours: Temp:  [97.6 F (36.4 C)] 97.6 F (36.4 C) (08/19 1248) Pulse Rate:  [66-125] 125 (08/19 1430) Resp:  [14-30] 25 (08/19 1430) BP: (78-95)/(50-58) 93/51 (08/19 1430) SpO2:  [100 %] 100 % (08/19 1430)   Last BM recorded by nurses in past 5 days No data recorded  Physical Exam Constitutional:      Appearance: He is ill-appearing.  HENT:     Head: Normocephalic and atraumatic.     Nose: Nose normal. No congestion.     Mouth/Throat:     Mouth: Mucous membranes are moist.     Pharynx: Oropharynx is clear.  Eyes:     Extraocular Movements: Extraocular movements intact.     Comments: Conjunctival pallor  Cardiovascular:     Rate and Rhythm: Normal rate and regular rhythm.  Pulmonary:     Effort: Pulmonary effort is normal. No respiratory distress.  Abdominal:     General: Bowel sounds are normal. There is no distension.     Palpations: Abdomen is soft. There is no mass.     Tenderness: There is no abdominal tenderness. There is no guarding or rebound.     Hernia: No hernia is present.  Musculoskeletal:        General: No swelling. Normal range of motion.     Cervical back: Normal range of motion and neck supple.  Skin:    General: Skin is warm and dry.  Neurological:     General: No focal deficit present.     Mental Status: He is oriented to person, place, and time.  Psychiatric:        Mood and Affect: Mood  normal.        Behavior: Behavior normal.        Thought Content: Thought content normal.        Judgment: Judgment normal.      LAB RESULTS: Recent Labs    02/11/23 1303  WBC 10.2  HGB 5.3*  HCT 16.4*  PLT 158   BMET Recent Labs    02/11/23 1303  NA 132*  K 5.7*  CL 110  CO2 13*  GLUCOSE 139*  BUN 50*  CREATININE 2.91*  CALCIUM 9.1   LFT Recent Labs    02/11/23 1303  PROT 5.0*  ALBUMIN 2.9*  AST 11*  ALT 12  ALKPHOS 57  BILITOT 0.5   PT/INR No results for input(s): "LABPROT", "INR" in the last 72 hours.  STUDIES: No results found.    Impression    GI bleed, likely post polypectomy bleed Symptomatic anemia Hgb 5.3 (baseline 14) MCV 107.2 Hypotensive 70s/50s BUN 50, creatinine 2.91 (baseline 2)  Hypovolemic/hemorrhagic shock in the setting of GI bleeding - Being admitted to ICU and PCCM consulted  COPD   Plan   - Continue daily CBC and transfuse as needed to maintain HGB > 7  - Will likely need colonoscopy for further evaluation and possible clip placement. Timing TBD with hgb less than 7 and currently hypotensive. - continue supportive care  Thank you for your kind consultation, we will continue to follow.   Charles Krueger  02/11/2023, 3:26 PM   I have taken an interval history, thoroughly reviewed the chart and examined the patient. I agree with the Advanced Practitioner's note, impression and recommendations, and have recorded additional findings, impressions and recommendations below. I performed a substantive portion of this encounter (>50% time spent), including a complete performance of the medical decision making.  My additional thoughts are as follows:  Large-volume post polypectomy bleeding starting 3 to 4 days after colonoscopy at the [facility] endoscopy center.  Will decompress while the full procedure report is not available, some encounter notes in Care Everywhere some sort of indicated that he to look at that area  subcentimeter polyps were removed from the cecum, and that a 16 mm polyp was removed from the hepatic flexure by hot snare.  The latter is most likely the source of bleeding.  This patient will be transfused blood overnight, he will be given blood pressure support as needed by the critical care service (though he appears to be stabilizing and the last passage of blood was earlier this morning), and he needs a colonoscopy tomorrow.  I explained all this to him in detail and he was agreeable.  Bowel preparation tonight for good visualization tomorrow.  I do not recommend a CT angiogram at this point given his CKD, and because we already have a likely suspect for the bleeding site that needs colonoscopic evaluation and control.  Case also discussed with Dr. Chestine Spore of the critical care service.  Charles Krueger [phone]

## 2023-02-11 NOTE — ED Notes (Signed)
ED TO INPATIENT HANDOFF REPORT  ED Nurse Name and Phone #:   S Name/Age/Gender Charles Krueger 67 y.o. male Room/Bed: 011C/011C  Code Status   Code Status: Full Code  Home/SNF/Other Home Patient oriented to: self, place, time, and situation Is this baseline? Yes   Triage Complete: Triage complete  Chief Complaint GIB (gastrointestinal bleeding) [K92.2]  Triage Note Patient arrived by POV with complaint of blood in stool.  Patient found hypotensive in lobby- Alert and oriented Reports bright red rectal bleeding x 1 day No vomiting and reports abdominal cramping. Had colonscopy this past Wednesday at [place]   Allergies Allergies  Allergen Reactions   Zestril [Lisinopril] Other (See Comments)    Acute renal failure    Level of Care/Admitting Diagnosis ED Disposition     ED Disposition  Admit   Condition  --   Comment  Hospital Area: [facility] [[id number]]  Level of Care: ICU [6]  May admit patient to [facility] or [facility] if equivalent level of care is available:: No  Covid Evaluation: Asymptomatic - no recent exposure (last 10 days) testing not required  Diagnosis: GIB (gastrointestinal bleeding) [161096]  Admitting Physician: Steffanie Dunn [[id number]]  Attending Physician: Steffanie Dunn [[id number]]  Certification:: I certify this patient will need inpatient services for at least 2 midnights  Expected Medical Readiness: 02/18/2023          B Medical/Surgery History Past Medical History:  Diagnosis Date   COPD (chronic obstructive pulmonary disease) (HCC)    Diabetes mellitus without complication (HCC)    GERD (gastroesophageal reflux disease)    HTN (hypertension)    Myocardial infarction (HCC)    silent Mi several years ago   OSA on CPAP    Pneumonia    pt reports was mentioned in 12/23 at [facility] visit   Sarcoidosis    Past Surgical History:  Procedure Laterality Date   INGUINAL HERNIA REPAIR Right 09/03/2022   Procedure: OPEN  RIGHT INGUINAL HERNIA REPAIR WITH MESH;  Surgeon: Andria Meuse, MD;  Location: [facility];  Service: General;  Laterality: Right;   RIGHT/LEFT HEART CATH AND CORONARY ANGIOGRAPHY N/A 12/14/2020   Procedure: RIGHT/LEFT HEART CATH AND CORONARY ANGIOGRAPHY;  Surgeon: Marykay Lex, MD;  Location: [facility] INVASIVE CV LAB;  Service: Cardiovascular;  Laterality: N/A;     A IV Location/Drains/Wounds Patient Lines/Drains/Airways Status     Active Line/Drains/Airways     Name Placement date Placement time Site Days   Peripheral IV 02/11/23 18 G Right Antecubital 02/11/23  1311  Antecubital  less than 1   Peripheral IV 02/11/23 20 G Anterior;Right Forearm 02/11/23  --  Forearm  less than 1            Intake/Output Last 24 hours No intake or output data in the 24 hours ending 02/11/23 1506  Labs/Imaging Results for orders placed or performed during the hospital encounter of 02/11/23 (from the past 48 hour(s))  CBC with Differential     Status: Abnormal   Collection Time: 02/11/23  1:03 PM  Result Value Ref Range   WBC 10.2 4.0 - 10.5 K/uL   RBC 1.53 (L) 4.22 - 5.81 MIL/uL   Hemoglobin 5.3 (LL) 13.0 - 17.0 g/dL    Comment: REPEATED TO VERIFY THIS CRITICAL RESULT HAS VERIFIED AND BEEN CALLED TO L Yakir Wenke RN BY BONNIE DAVIS ON 08 19 2024 AT 1440, AND HAS BEEN READ BACK.     HCT 16.4 (L) 39.0 - 52.0 %  MCV 107.2 (H) 80.0 - 100.0 fL   MCH 34.6 (H) 26.0 - 34.0 pg   MCHC 32.3 30.0 - 36.0 g/dL   RDW 81.1 91.4 - 78.2 %   Platelets 158 150 - 400 K/uL   nRBC 0.0 0.0 - 0.2 %   Neutrophils Relative % 61 %   Neutro Abs 6.3 1.7 - 7.7 K/uL   Lymphocytes Relative 29 %   Lymphs Abs 2.9 0.7 - 4.0 K/uL   Monocytes Relative 9 %   Monocytes Absolute 0.9 0.1 - 1.0 K/uL   Eosinophils Relative 0 %   Eosinophils Absolute 0.0 0.0 - 0.5 K/uL   Basophils Relative 0 %   Basophils Absolute 0.0 0.0 - 0.1 K/uL   Immature Granulocytes 1 %   Abs Immature Granulocytes 0.06 0.00 - 0.07 K/uL    Comment:  Performed at [facility], [address] [place], [place] [address]  Comprehensive metabolic panel     Status: Abnormal   Collection Time: 02/11/23  1:03 PM  Result Value Ref Range   Sodium 132 (L) 135 - 145 mmol/L   Potassium 5.7 (H) 3.5 - 5.1 mmol/L   Chloride 110 98 - 111 mmol/L   CO2 13 (L) 22 - 32 mmol/L   Glucose, Bld 139 (H) 70 - 99 mg/dL    Comment: Glucose reference range applies only to samples taken after fasting for at least 8 hours.   BUN 50 (H) 8 - 23 mg/dL   Creatinine, Ser 3.08 (H) 0.61 - 1.24 mg/dL   Calcium 9.1 8.9 - 65.7 mg/dL   Total Protein 5.0 (L) 6.5 - 8.1 g/dL   Albumin 2.9 (L) 3.5 - 5.0 g/dL   AST 11 (L) 15 - 41 U/L   ALT 12 0 - 44 U/L   Alkaline Phosphatase 57 38 - 126 U/L   Total Bilirubin 0.5 0.3 - 1.2 mg/dL   GFR, Estimated 23 (L) >60 mL/min    Comment: (NOTE) Calculated using the CKD-EPI Creatinine Equation (2021)    Anion gap 9 5 - 15    Comment: Performed at [facility], [address] [place], [place] [address]  Type and screen [facility]     Status: None   Collection Time: 02/11/23  1:06 PM  Result Value Ref Range   ABO/RH(D) B POS    Antibody Screen NEG    Sample Expiration      02/14/2023,2359 Performed at [facility], [address], [place], [place] [address]   ABO/Rh     Status: None (Preliminary result)   Collection Time: 02/11/23  2:46 PM  Result Value Ref Range   ABO/RH(D) PENDING    No results found.  Pending Labs Unresulted Labs (From admission, onward)     Start     Ordered   02/12/23 0500  CBC  Tomorrow morning,   R        02/11/23 1503   02/12/23 0500  Basic metabolic panel  Tomorrow morning,   R        02/11/23 1503   02/12/23 0500  Magnesium  Tomorrow morning,   R        02/11/23 1503   02/12/23 0500  Phosphorus  Tomorrow morning,   R        02/11/23 1503   02/11/23 1443  Prepare RBC (crossmatch)  (Blood Administration Adult)  Once,   R       Question Answer Comment  #  of  Units 3 units   Transfusion Indications Hemoglobin < 7 gm/dL and symptomatic   Number of Units to Keep Ahead 2 units ahead   Keep ahead indications Active Bleeding/GI Bleed   If emergent release call blood bank Not emergent release      02/11/23 1442            Vitals/Pain Today's Vitals   02/11/23 1340 02/11/23 1400 02/11/23 1415 02/11/23 1430  BP: (!) 80/58 (!) 91/54 (!) 95/51 (!) 93/51  Pulse:  71 66 (!) 125  Resp: 14 19 (!) 30 (!) 25  Temp:      SpO2:  100% 100% 100%  PainSc:        Isolation Precautions No active isolations  Medications Medications  0.9 %  sodium chloride infusion (Manually program via Guardrails IV Fluids) (has no administration in time range)  docusate sodium (COLACE) capsule 100 mg (has no administration in time range)  polyethylene glycol (MIRALAX / GLYCOLAX) packet 17 g (has no administration in time range)  ondansetron (ZOFRAN) injection 4 mg (has no administration in time range)  pantoprazole (PROTONIX) injection 40 mg (has no administration in time range)  insulin aspart (novoLOG) injection 0-9 Units (has no administration in time range)  sodium chloride 0.9 % bolus 1,000 mL (1,000 mLs Intravenous New Bag/Given 02/11/23 1341)    Mobility walks     Focused Assessments    R Recommendations: See Admitting Provider Note  Report given to:   Additional Notes:

## 2023-02-11 NOTE — TOC Initial Note (Signed)
Transition of Care [facility]) - Initial/Assessment Note    Patient Details  Name: Charles Krueger MRN: [id number] Date of Birth: [birth year]  [facility] CM/SW Contact:    Elliot Cousin, RN Phone Number: [phone] 02/11/2023, 4:24 PM  Clinical Narrative:   [facility] called for 72 hour notification, # [phone]. Patient from home with wife, Charles Krueger.                Expected Discharge Plan: Home/Self Care Barriers to Discharge: Continued Medical Work up   Patient Goals and CMS Choice            Expected Discharge Plan and Services   Discharge Planning Services: CM Consult                                          Prior Living Arrangements/Services     Patient language and need for interpreter reviewed:: Yes Do you feel safe going back to the place where you live?: Yes            Criminal Activity/Legal Involvement Pertinent to Current Situation/Hospitalization: No - Comment as needed  Activities of Daily Living      Permission Sought/Granted Permission sought to share information with : Case Manager, Family Supports    Share Information with NAME: Shed Lipp     Permission granted to share info w Relationship: wife  Permission granted to share info w Contact Information: [phone]  Emotional Assessment              Admission diagnosis:  GIB (gastrointestinal bleeding) [K92.2] Patient Active Problem List   Diagnosis Date Noted   GIB (gastrointestinal bleeding) 02/11/2023   Acute pancreatitis 04/19/2022   Hypoxemia 04/19/2022   Chronic combined systolic and diastolic heart failure (HCC) 12/30/2020   Cirrhosis (HCC)    Dilated cardiomyopathy (HCC)    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 12/09/2020   Acute exacerbation of CHF (congestive heart failure) (HCC) 12/08/2020   Anemia 12/08/2020   Mediastinal lymphadenopathy 12/08/2020   COPD (chronic obstructive pulmonary disease) (HCC) 12/08/2020   OSA  (obstructive sleep apnea) 12/08/2020   Diabetes mellitus (HCC) 12/08/2020   CAD (coronary artery disease) 12/08/2020   Hyperlipidemia 12/08/2020   Stage 3b chronic kidney disease (HCC) 10/13/2020   Pulmonary sarcoidosis (HCC) 06/25/2006   PCP:  Cl[facility], [facility] Pharmacy:   [facility] - [place], [place] - [address] [place] [address] Phone: [phone] Fax: [phone]  [place] [facility] [address] [place] [address] Phone: [phone] Fax: [phone]  [id number] [place], [place] - [address] AT [facility] [address] [place] [address] Phone: [phone] Fax: [phone]  [facility] [facility] [address] [place] [address] Phone: [phone] Fax: [phone]  [facility] [address] [place] [address] Phone: [phone] Fax: [phone]     Social Determinants of Health (SDOH) Social History: SDOH Screenings   Food Insecurity: No Food Insecurity (01/05/2021)   Received from [facility]  Social Connections: Unknown (11/06/2021)   Received from [facility]  Stress: No Stress Concern Present (10/13/2020)   Received from [facility]  Tobacco Use: High Risk (02/11/2023)   SDOH Interventions:     Readmission Risk  Interventions     No data to display

## 2023-02-11 NOTE — ED Triage Notes (Signed)
Patient arrived by POV with complaint of blood in stool.  Patient found hypotensive in lobby- Alert and oriented Reports bright red rectal bleeding x 1 day No vomiting and reports abdominal cramping. Had colonscopy this past Wednesday at [place]

## 2023-02-11 NOTE — ED Notes (Signed)
Pt's BP 78/50, brought back to Triage for PA assessment & this RN starting PIV & drawing lab work.

## 2023-02-11 NOTE — Plan of Care (Signed)

## 2023-02-11 NOTE — H&P (Signed)
NAME:  Charles Krueger, MRN:  [id number], DOB:  04-Oct-1955, LOS: 0 ADMISSION DATE:  02/11/2023 CONSULTATION DATE:  02/11/2023 REFERRING MD:  Estell Harpin - EDP CHIEF COMPLAINT:  GIB, hypotension   History of Present Illness:  67 year old man who presented to [facility] ED 8/19 for blood in stool. PMHx significant for HTN, CAD with MI, HFpEF (Echo 04/2021 with EF 55-60%, +RWMAs with basal inferior akinesis, G1DD), COPD, OSA (on CPAP), sarcoidosis, T2DM. Recently underwent colonoscopy with [facility] 02/06/2023.  On arrival to ED, patient reported blood in his stool (BRBPR x 1-2 days, may have started 8/17 per patient). Endorses dizziness. Noted to be hypotensive in the ED lobby to 70s/50s. Per chart review, had 1 large (16mm) polyp removed from hepatic flexure via hot snare and 2 small 2mm polyps removed from cecum via cold forceps 8/14. Colonoscopy was also notable for colonic diverticulosis.   On ED presentation, hypothermic to 36.4, HR 83, BP 78/50, RR 16, SpO2 100%. Labs were notable for WBC 10.2, Hgb 5.3 (baseline ~14), Plt 158. Na 132, K 5.7, CO2 13, BUN/Cr 50/2.91 (baseline ~1.7-2.0), LFTs WNL. GI consulted for further evaluation.  PCCM consulted for ICU admission.  Pertinent Medical History:   Past Medical History:  Diagnosis Date   COPD (chronic obstructive pulmonary disease) (HCC)    Diabetes mellitus without complication (HCC)    GERD (gastroesophageal reflux disease)    HTN (hypertension)    Myocardial infarction (HCC)    silent Mi several years ago   OSA on CPAP    Pneumonia    pt reports was mentioned in 12/23 at [facility] visit   Sarcoidosis    Significant Hospital Events: Including procedures, antibiotic start and stop dates in addition to other pertinent events   8/19 - Presented to ED with BRBPR x 1 day + dizziness, s/p colonoscopy at [facility] 5 days ago. Hypotensive to 70s/50s. Hgb 5.3. 3U PRBCs ordered in ED. GI consulted. PCCM consulted for ICU admission.  Interim History / Subjective:  PCCM  consulted for ICU admission.  Objective:  Blood pressure (!) 93/51, pulse (!) 125, temperature 97.6 F (36.4 C), resp. rate (!) 25, SpO2 100%.       No intake or output data in the 24 hours ending 02/11/23 1517 There were no vitals filed for this visit.  Physical Examination: General: Acutely ill-appearing older man in NAD. HEENT: [place]/AT, anicteric sclera, PERRL, dry mucous membranes. Neuro: Awake, oriented x 4. Responds to verbal stimuli. Following commands consistently. Moves all 4 extremities spontaneously. Strength 4-5/5 in all 4 extremities.  CV: RRR, no m/g/r. PULM: Breathing even and unlabored on RA. Lung fields CTAB. GI: Soft, nontender, nondistended. Normoactive bowel sounds. Extremities: No LE edema noted. Skin: Warm/dry, no rashes.  Resolved Hospital Problem List:    Assessment & Plan:  Shock, hypovolemic/hemorrhagic in the setting of GIB ABLA Presented to ED for BRBPR x 1 day. Recent colonoscopy at [facility] 8/14 1 large (16mm) polyp removed from hepatic flexure via hot snare and 2 small 2mm polyps removed from cecum via cold forceps 8/14. Colonoscopy was also notable for colonic diverticulosis.  - Goal MAP > 65 - Fluid resuscitation as tolerated - 3U PRBCs ordered - Trend H&H - Monitor for signs of active bleeding - Transfuse for Hgb < 7.0 or hemodynamically significant bleeding - If necessary, begin peripheral Levophed titrated to goal MAP - Trend LA  GIB Colonic diverticulosis S/p colonoscopy at [facility] Wednesday 8/14. - GI consulted, appreciate recommendations - PPI BID - NPO for now - ?  Scope in-unit, will f/u with GI  HTN CAD with MI HFpEF Echo 04/2021 with EF 55-60%, +RWMAs with basal inferior akinesis, G1DD. - Hold GDMT for now - Hold home antihypertensives - Hold ASA/statin for now  COPD OSA (on CPAP) Sarcoidosis - Supplemental O2 support as needed - CPAP QHS - Wean O2 for sat > 90% - Bronchodilators  (Brovana/Incruse, can use Yupelri instead if  needed) + Albuterol PRN - Pulmonary hygiene  AKI on CKD stage 3b Hyperkalemia Baseline Cr ~1.7-2.0. Cr 2.39 on admission. - Trend BMP, suspect K hemolyzed vs. concentrated, will repeat prior to treating/shifting - Replete electrolytes as indicated - Monitor I&Os - Avoid nephrotoxic agents as able - Ensure adequate renal perfusion  T2DM - SSI - CBGs Q4H - Goal CBG 140-180  Best Practice: (right click and "Reselect all SmartList Selections" daily)   Diet/type: NPO DVT prophylaxis: SCDs, no AC/DVT ppx in the setting of active GIB GI prophylaxis: PPI Lines: N/A Foley:  N/A Code Status:  full code Last date of multidisciplinary goals of care discussion [Pending]  Labs:  CBC: Recent Labs  Lab 02/11/23 1303  WBC 10.2  NEUTROABS 6.3  HGB 5.3*  HCT 16.4*  MCV 107.2*  PLT 158   Basic Metabolic Panel: Recent Labs  Lab 02/11/23 1303  NA 132*  K 5.7*  CL 110  CO2 13*  GLUCOSE 139*  BUN 50*  CREATININE 2.91*  CALCIUM 9.1   GFR: CrCl cannot be calculated (Unknown ideal weight.). Recent Labs  Lab 02/11/23 1303  WBC 10.2   Liver Function Tests: Recent Labs  Lab 02/11/23 1303  AST 11*  ALT 12  ALKPHOS 57  BILITOT 0.5  PROT 5.0*  ALBUMIN 2.9*   No results for input(s): "LIPASE", "AMYLASE" in the last 168 hours. No results for input(s): "AMMONIA" in the last 168 hours.  ABG:    Component Value Date/Time   PHART 7.410 12/14/2020 1032   PCO2ART 49.4 (H) 12/14/2020 1032   PO2ART 62 (L) 12/14/2020 1032   HCO3 33.5 (H) 12/14/2020 1039   HCO3 32.9 (H) 12/14/2020 1039   TCO2 35 (H) 12/14/2020 1039   TCO2 35 (H) 12/14/2020 1039   O2SAT 52.0 12/14/2020 1039   O2SAT 56.0 12/14/2020 1039    Coagulation Profile: No results for input(s): "INR", "PROTIME" in the last 168 hours.  Cardiac Enzymes: No results for input(s): "CKTOTAL", "CKMB", "CKMBINDEX", "TROPONINI" in the last 168 hours.  HbA1C: Hgb A1c MFr Bld  Date/Time Value Ref Range Status  08/27/2022  09:08 AM 5.3 4.8 - 5.6 % Final    Comment:    (NOTE)         Prediabetes: 5.7 - 6.4         Diabetes: >6.4         Glycemic control for adults with diabetes: <7.0   04/20/2022 04:37 AM 5.0 4.8 - 5.6 % Final    Comment:    (NOTE) Pre diabetes:          5.7%-6.4%  Diabetes:              >6.4%  Glycemic control for   <7.0% adults with diabetes    CBG: No results for input(s): "GLUCAP" in the last 168 hours.  Review of Systems:   Review of Systems  Constitutional:  Positive for malaise/fatigue. Negative for chills, fever and weight loss.  HENT:  Negative for congestion and sore throat.   Eyes:  Negative for blurred vision.  Respiratory:  Negative for cough,  hemoptysis and shortness of breath.   Cardiovascular:  Negative for chest pain, palpitations and leg swelling.  Gastrointestinal:  Positive for blood in stool. Negative for abdominal pain, diarrhea, nausea and vomiting.  Genitourinary:  Negative for hematuria.  Neurological:  Positive for dizziness and weakness.  Endo/Heme/Allergies:  Does not bruise/bleed easily.   Past Medical History:  He,  has a past medical history of COPD (chronic obstructive pulmonary disease) (HCC), Diabetes mellitus without complication (HCC), GERD (gastroesophageal reflux disease), HTN (hypertension), Myocardial infarction (HCC), OSA on CPAP, Pneumonia, and Sarcoidosis.   Surgical History:   Past Surgical History:  Procedure Laterality Date   INGUINAL HERNIA REPAIR Right 09/03/2022   Procedure: OPEN RIGHT INGUINAL HERNIA REPAIR WITH MESH;  Surgeon: Andria Meuse, MD;  Location: [facility] ORS;  Service: General;  Laterality: Right;   RIGHT/LEFT HEART CATH AND CORONARY ANGIOGRAPHY N/A 12/14/2020   Procedure: RIGHT/LEFT HEART CATH AND CORONARY ANGIOGRAPHY;  Surgeon: Marykay Lex, MD;  Location: [facility] INVASIVE CV LAB;  Service: Cardiovascular;  Laterality: N/A;    Social History:   reports that he has been smoking cigars. He started smoking about 22  years ago. He has never used smokeless tobacco. He reports current alcohol use of about 6.0 - 7.0 standard drinks of alcohol per week. He reports that he does not use drugs.   Family History:  His family history includes CAD in his mother.   Allergies: Allergies  Allergen Reactions   Zestril [Lisinopril] Other (See Comments)    Acute renal failure   Home Medications: Prior to Admission medications   Medication Sig Start Date End Date Taking? Authorizing Provider  albuterol (VENTOLIN HFA) 108 (90 Base) MCG/ACT inhaler Inhale 2 puffs into the lungs every 6 (six) hours as needed for wheezing or shortness of breath.    [provider]  allopurinol (ZYLOPRIM) 100 MG tablet Take 50 mg by mouth daily.    [provider]  aspirin 81 MG tablet Take 162 mg by mouth at bedtime.    [provider]  atorvastatin (LIPITOR) 40 MG tablet Take 1 tablet (40 mg total) by mouth daily. 01/09/21   [name], NP  azithromycin (ZITHROMAX) 250 MG tablet Take 1 tablet (250 mg total) by mouth daily. Take first 2 tablets together, then 1 every day until finished. 12/19/22   Valinda Hoar, NP  buPROPion (WELLBUTRIN XL) 150 MG 24 hr tablet Take 150 mg by mouth at bedtime. 07/03/22   [provider]  carvedilol (COREG) 25 MG tablet Take 25 mg by mouth 2 (two) times daily with a meal.    [provider]  empagliflozin (JARDIANCE) 10 MG TABS tablet Take 1 tablet (10 mg total) by mouth daily. 12/23/20   [name], MD  fluticasone (FLONASE) 50 MCG/ACT nasal spray Place 1 spray into both nostrils 2 (two) times daily.    [provider]  hydrALAZINE (APRESOLINE) 50 MG tablet Take 1.5 tablets (75 mg total) by mouth 3 (three) times daily. Patient taking differently: Take 50 mg by mouth 3 (three) times daily. 05/12/21   [name], MD  ipratropium (ATROVENT) 0.03 % nasal spray Place 2 sprays into both nostrils daily. 07/18/21   [provider]   isosorbide mononitrate (IMDUR) 30 MG 24 hr tablet Take 1 tablet (30 mg total) by mouth daily. 03/14/21   [name], [name], [name]  ketotifen (ZADITOR) 0.025 % ophthalmic solution Place 1 drop into both eyes 2 (two) times daily as needed (itchy  eyes).    [provider]  montelukast (SINGULAIR) 10 MG tablet Take 10 mg by mouth daily.    [provider]  Multiple Vitamin (MULTIVITAMIN WITH MINERALS) TABS tablet Take 1 tablet by mouth daily.    [provider]  Omega-3 Fatty Acids (FISH OIL) 1200 MG CAPS Take 1,200 mg by mouth daily.    [provider]  sacubitril-valsartan (ENTRESTO) 97-103 MG Take 1 tablet by mouth 2 times daily. 01/19/21   [name], [name]  spironolactone (ALDACTONE) 25 MG tablet Take 1 tablet (25 mg total) by mouth daily. 02/13/21   [name], MD  Tiotropium Bromide-Olodaterol (STIOLTO RESPIMAT) 2.5-2.5 MCG/ACT AERS Inhale 2 puffs into the lungs daily.    [provider]   Critical care time:    The patient is critically ill with multiple organ system failure and requires high complexity decision making for assessment and support, frequent evaluation and titration of therapies, advanced monitoring, review of radiographic studies and interpretation of complex data.   Critical Care Time devoted to patient care services, exclusive of separately billable procedures, described in this note is 38 minutes.  Tim Lair, PA-C [facility] 02/11/23 3:17 PM  Please see [email or web address] for pager details.  From 7A-7P if no response, please call [phone] After hours, please call ELink [phone]

## 2023-02-11 NOTE — ED Provider Triage Note (Signed)
Emergency Medicine Provider Triage Evaluation Note  Charles Krueger , a 67 y.o. male  was evaluated in triage.  Patient had a colonoscopy Wednesday of last week.  Initially was fine but started to have bright red bleeding per rectum yesterday.  Has started to feel more dizzy than normal.  Denies any history of hemorrhoids.  Review of Systems  Positive: As above Negative: As above  Physical Exam  BP (!) 78/50   Pulse 83   Temp 97.6 F (36.4 C)   Resp 16   SpO2 100%  Gen:   Awake, no distress   Resp:  Normal effort  MSK:   Moves extremities without difficulty    Medical Decision Making  Medically screening exam initiated at 1:03 PM.  Appropriate orders placed.  Charles Krueger was informed that the remainder of the evaluation will be completed by another provider, this initial triage assessment does not replace that evaluation, and the importance of remaining in the ED until their evaluation is complete.     Arabella Merles, PA-C 02/11/23 1305

## 2023-02-11 NOTE — Consult Note (Addendum)
Consultation  Referring Provider:  [facility]  Primary Care Physician:  [facility]       Reason for Consultation:   GI bleed  LOS: 0 days          HPI:   Charles Krueger is a 67 y.o. male with past medical history significant for CAD with MI, COPD, OSA (on CPAP), sarcoidosis, history of colon polyps, presents for evaluation of rectal bleeding.  Patient reports he underwent colonoscopy with the [facility] Wednesday, 02/06/2023 in which she had 2 small polyps and 1 large polyp removed.  Prior to this his last colonoscopy was 1.5 years ago when he had 9 polyps removed.  He states between yesterday and today he has had 4 episodes of dark red rectal bleeding, large volume.  Reports associated shortness of breath, chest pain, and dizziness as well.  No previous episodes of GI bleeding.  Denies abdominal pain, nausea, vomiting.  Upon arrival he is hypotensive with BP 70s/50, heart rate 83.  Hgb 5.3 (baseline 14).  Platelets 158.  BUN 50, creatinine 2.91.  States his last episode of bleeding was this morning.  PCCM was consulted and patient is being admitted to ICU  Past Medical History:  Diagnosis Date   COPD (chronic obstructive pulmonary disease) (HCC)    Diabetes mellitus without complication (HCC)    GERD (gastroesophageal reflux disease)    HTN (hypertension)    Myocardial infarction (HCC)    silent Mi several years ago   OSA on CPAP    Pneumonia    pt reports was mentioned in 12/23 at [facility] visit   Sarcoidosis     Surgical History:  He  has a past surgical history that includes RIGHT/LEFT HEART CATH AND CORONARY ANGIOGRAPHY (N/A, 12/14/2020) and Inguinal hernia repair (Right, 09/03/2022). Family History:  His family history includes CAD in his mother. Social History:   reports that he has been smoking cigars. He started smoking about 22 years ago. He has never used smokeless tobacco. He reports current alcohol use of about 6.0 - 7.0  standard drinks of alcohol per week. He reports that he does not use drugs.  Prior to Admission medications   Medication Sig Start Date End Date Taking? Authorizing Provider  albuterol (VENTOLIN HFA) 108 (90 Base) MCG/ACT inhaler Inhale 2 puffs into the lungs every 6 (six) hours as needed for wheezing or shortness of breath.    [provider]  allopurinol (ZYLOPRIM) 100 MG tablet Take 50 mg by mouth daily.    [provider]  aspirin 81 MG tablet Take 162 mg by mouth at bedtime.    [provider]  atorvastatin (LIPITOR) 40 MG tablet Take 1 tablet (40 mg total) by mouth daily. 01/09/21   [name], NP  azithromycin (ZITHROMAX) 250 MG tablet Take 1 tablet (250 mg total) by mouth daily. Take first 2 tablets together, then 1 every day until finished. 12/19/22   Valinda Hoar, NP  buPROPion (WELLBUTRIN XL) 150 MG 24 hr tablet Take 150 mg by mouth at bedtime. 07/03/22   [provider]  carvedilol (COREG) 25 MG tablet Take 25 mg by mouth 2 (two) times daily with a meal.    [provider]  empagliflozin (JARDIANCE) 10 MG TABS tablet Take 1 tablet (10 mg total) by mouth daily. 12/23/20   [name], MD  fluticasone (FLONASE) 50 MCG/ACT nasal spray Place 1 spray into both nostrils 2 (two)  times daily.    [provider]  hydrALAZINE (APRESOLINE) 50 MG tablet Take 1.5 tablets (75 mg total) by mouth 3 (three) times daily. Patient taking differently: Take 50 mg by mouth 3 (three) times daily. 05/12/21   [name], MD  ipratropium (ATROVENT) 0.03 % nasal spray Place 2 sprays into both nostrils daily. 07/18/21   [provider]  isosorbide mononitrate (IMDUR) 30 MG 24 hr tablet Take 1 tablet (30 mg total) by mouth daily. 03/14/21   [name], [name], [name]  ketotifen (ZADITOR) 0.025 % ophthalmic solution Place 1 drop into both eyes 2 (two) times daily as needed (itchy eyes).    [provider]  montelukast (SINGULAIR) 10  MG tablet Take 10 mg by mouth daily.    [provider]  Multiple Vitamin (MULTIVITAMIN WITH MINERALS) TABS tablet Take 1 tablet by mouth daily.    [provider]  Omega-3 Fatty Acids (FISH OIL) 1200 MG CAPS Take 1,200 mg by mouth daily.    [provider]  sacubitril-valsartan (ENTRESTO) 97-103 MG Take 1 tablet by mouth 2 times daily. 01/19/21   [name], [name]  spironolactone (ALDACTONE) 25 MG tablet Take 1 tablet (25 mg total) by mouth daily. 02/13/21   [name], MD  Tiotropium Bromide-Olodaterol (STIOLTO RESPIMAT) 2.5-2.5 MCG/ACT AERS Inhale 2 puffs into the lungs daily.    [provider]    Current Facility-Administered Medications  Medication Dose Route Frequency Provider Last Rate Last Admin   0.9 %  sodium chloride infusion (Manually program via Guardrails IV Fluids)   Intravenous Once Bethann Berkshire, MD       docusate sodium (COLACE) capsule 100 mg  100 mg Oral BID PRN Cloyd Stagers M, PA-C       insulin aspart (novoLOG) injection 0-9 Units  0-9 Units Subcutaneous Q4H Cloyd Stagers M, PA-C       ondansetron [facility]) injection 4 mg  4 mg Intravenous Q6H PRN Cloyd Stagers M, PA-C       pantoprazole (PROTONIX) injection 40 mg  40 mg Intravenous Q12H Cloyd Stagers M, PA-C       polyethylene glycol (MIRALAX / GLYCOLAX) packet 17 g  17 g Oral Daily PRN Tim Lair, PA-C       Current Outpatient Medications  Medication Sig Dispense Refill   albuterol (VENTOLIN HFA) 108 (90 Base) MCG/ACT inhaler Inhale 2 puffs into the lungs every 6 (six) hours as needed for wheezing or shortness of breath.     allopurinol (ZYLOPRIM) 100 MG tablet Take 50 mg by mouth daily.     aspirin 81 MG tablet Take 162 mg by mouth at bedtime.     atorvastatin (LIPITOR) 40 MG tablet Take 1 tablet (40 mg total) by mouth daily. 30 tablet 1   azithromycin (ZITHROMAX) 250 MG tablet Take 1 tablet (250 mg total) by mouth daily. Take first 2 tablets together,  then 1 every day until finished. 6 tablet 0   buPROPion (WELLBUTRIN XL) 150 MG 24 hr tablet Take 150 mg by mouth at bedtime.     carvedilol (COREG) 25 MG tablet Take 25 mg by mouth 2 (two) times daily with a meal.     empagliflozin (JARDIANCE) 10 MG TABS tablet Take 1 tablet (10 mg total) by mouth daily. 90 tablet 3   fluticasone (FLONASE) 50 MCG/ACT nasal spray Place 1 spray into both nostrils 2 (two) times daily.     hydrALAZINE (APRESOLINE) 50 MG tablet Take 1.5 tablets (75  mg total) by mouth 3 (three) times daily. (Patient taking differently: Take 50 mg by mouth 3 (three) times daily.) 270 tablet 3   ipratropium (ATROVENT) 0.03 % nasal spray Place 2 sprays into both nostrils daily.     isosorbide mononitrate (IMDUR) 30 MG 24 hr tablet Take 1 tablet (30 mg total) by mouth daily. 90 tablet 3   ketotifen (ZADITOR) 0.025 % ophthalmic solution Place 1 drop into both eyes 2 (two) times daily as needed (itchy eyes).     montelukast (SINGULAIR) 10 MG tablet Take 10 mg by mouth daily.     Multiple Vitamin (MULTIVITAMIN WITH MINERALS) TABS tablet Take 1 tablet by mouth daily.     Omega-3 Fatty Acids (FISH OIL) 1200 MG CAPS Take 1,200 mg by mouth daily.     sacubitril-valsartan (ENTRESTO) 97-103 MG Take 1 tablet by mouth 2 times daily. 180 tablet 3   spironolactone (ALDACTONE) 25 MG tablet Take 1 tablet (25 mg total) by mouth daily. 90 tablet 3   Tiotropium Bromide-Olodaterol (STIOLTO RESPIMAT) 2.5-2.5 MCG/ACT AERS Inhale 2 puffs into the lungs daily.      Allergies as of 02/11/2023 - Review Complete 02/11/2023  Allergen Reaction Noted   Zestril [lisinopril] Other (See Comments)     Review of Systems  Constitutional:  Negative for chills, fever, malaise/fatigue and weight loss.  HENT:  Negative for hearing loss and tinnitus.   Eyes:  Negative for blurred vision and double vision.  Respiratory:  Positive for shortness of breath. Negative for cough.   Cardiovascular:  Positive for chest pain.  Negative for palpitations.  Gastrointestinal:  Positive for blood in stool. Negative for abdominal pain, constipation, diarrhea, heartburn, melena, nausea and vomiting.  Genitourinary:  Negative for dysuria and urgency.  Musculoskeletal:  Negative for myalgias and neck pain.  Skin:  Negative for itching and rash.  Neurological:  Positive for weakness. Negative for seizures and loss of consciousness.  Psychiatric/Behavioral:  Negative for depression and suicidal ideas.        Physical Exam:  Vital signs in last 24 hours: Temp:  [97.6 F (36.4 C)] 97.6 F (36.4 C) (08/19 1248) Pulse Rate:  [66-125] 125 (08/19 1430) Resp:  [14-30] 25 (08/19 1430) BP: (78-95)/(50-58) 93/51 (08/19 1430) SpO2:  [100 %] 100 % (08/19 1430)   Last BM recorded by nurses in past 5 days No data recorded  Physical Exam Constitutional:      Appearance: He is ill-appearing.  HENT:     Head: Normocephalic and atraumatic.     Nose: Nose normal. No congestion.     Mouth/Throat:     Mouth: Mucous membranes are moist.     Pharynx: Oropharynx is clear.  Eyes:     Extraocular Movements: Extraocular movements intact.     Comments: Conjunctival pallor  Cardiovascular:     Rate and Rhythm: Normal rate and regular rhythm.  Pulmonary:     Effort: Pulmonary effort is normal. No respiratory distress.  Abdominal:     General: Bowel sounds are normal. There is no distension.     Palpations: Abdomen is soft. There is no mass.     Tenderness: There is no abdominal tenderness. There is no guarding or rebound.     Hernia: No hernia is present.  Musculoskeletal:        General: No swelling. Normal range of motion.     Cervical back: Normal range of motion and neck supple.  Skin:    General: Skin is warm and dry.  Neurological:     General: No focal deficit present.     Mental Status: He is oriented to person, place, and time.  Psychiatric:        Mood and Affect: Mood normal.        Behavior: Behavior normal.         Thought Content: Thought content normal.        Judgment: Judgment normal.      LAB RESULTS: Recent Labs    02/11/23 1303  WBC 10.2  HGB 5.3*  HCT 16.4*  PLT 158   BMET Recent Labs    02/11/23 1303  NA 132*  K 5.7*  CL 110  CO2 13*  GLUCOSE 139*  BUN 50*  CREATININE 2.91*  CALCIUM 9.1   LFT Recent Labs    02/11/23 1303  PROT 5.0*  ALBUMIN 2.9*  AST 11*  ALT 12  ALKPHOS 57  BILITOT 0.5   PT/INR No results for input(s): "LABPROT", "INR" in the last 72 hours.  STUDIES: No results found.    Impression    GI bleed, likely post polypectomy bleed Symptomatic anemia Hgb 5.3 (baseline 14) MCV 107.2 Hypotensive 70s/50s BUN 50, creatinine 2.91 (baseline 2)  Hypovolemic/hemorrhagic shock in the setting of GI bleeding - Being admitted to ICU and PCCM consulted  COPD   Plan   - Continue daily CBC and transfuse as needed to maintain HGB > 7  - Will likely need colonoscopy for further evaluation and possible clip placement. Timing TBD with hgb less than 7 and currently hypotensive. - continue supportive care  Thank you for your kind consultation, we will continue to follow.   Bayley Leanna Sato  02/11/2023, 3:26 PM   I have taken an interval history, thoroughly reviewed the chart and examined the patient. I agree with the Advanced Practitioner's note, impression and recommendations, and have recorded additional findings, impressions and recommendations below. I performed a substantive portion of this encounter (>50% time spent), including a complete performance of the medical decision making.  My additional thoughts are as follows:  Large-volume post polypectomy bleeding starting 3 to 4 days after colonoscopy at the [facility] endoscopy center.  Will decompress while the full procedure report is not available, some encounter notes in Care Everywhere some sort of indicated that he to look at that area subcentimeter polyps were removed from the cecum, and  that a 16 mm polyp was removed from the hepatic flexure by hot snare.  The latter is most likely the source of bleeding.  This patient will be transfused blood overnight, he will be given blood pressure support as needed by the critical care service (though he appears to be stabilizing and the last passage of blood was earlier this morning), and he needs a colonoscopy tomorrow.  I explained all this to him in detail and he was agreeable.  Bowel preparation tonight for good visualization tomorrow.  I do not recommend a CT angiogram at this point given his CKD, and because we already have a likely suspect for the bleeding site that needs colonoscopic evaluation and control.  Case also discussed with Dr. Chestine Spore of the critical care service.  [name] Office:[phone]

## 2023-02-12 ENCOUNTER — Inpatient Hospital Stay (HOSPITAL_COMMUNITY): Payer: No Typology Code available for payment source

## 2023-02-12 ENCOUNTER — Inpatient Hospital Stay (HOSPITAL_COMMUNITY): Payer: No Typology Code available for payment source | Admitting: Certified Registered"

## 2023-02-12 ENCOUNTER — Encounter (HOSPITAL_COMMUNITY)
Admission: EM | Disposition: A | Discharge status: Home / Self Care | Payer: Self-pay | Source: Home / Self Care | Attending: Internal Medicine

## 2023-02-12 DIAGNOSIS — K633 Ulcer of intestine: Secondary | ICD-10-CM

## 2023-02-12 DIAGNOSIS — I5042 Chronic combined systolic (congestive) and diastolic (congestive) heart failure: Secondary | ICD-10-CM

## 2023-02-12 DIAGNOSIS — K5730 Diverticulosis of large intestine without perforation or abscess without bleeding: Secondary | ICD-10-CM

## 2023-02-12 DIAGNOSIS — I130 Hypertensive heart and chronic kidney disease with heart failure and stage 1 through stage 4 chronic kidney disease, or unspecified chronic kidney disease: Secondary | ICD-10-CM | POA: Diagnosis not present

## 2023-02-12 DIAGNOSIS — K921 Melena: Secondary | ICD-10-CM | POA: Diagnosis not present

## 2023-02-12 DIAGNOSIS — D62 Acute posthemorrhagic anemia: Secondary | ICD-10-CM

## 2023-02-12 HISTORY — PX: HEMOSTASIS CLIP PLACEMENT: SHX6857

## 2023-02-12 HISTORY — PX: COLONOSCOPY: SHX5424

## 2023-02-12 LAB — TYPE AND SCREEN
ABO/RH(D): B POS
Antibody Screen: NEGATIVE
Unit division: 0
Unit division: 0
Unit division: 0

## 2023-02-12 LAB — BPAM RBC
Blood Product Expiration Date: 202409032359
Blood Product Expiration Date: 202409112359
Blood Product Expiration Date: 202409112359
ISSUE DATE / TIME: 202408191622
ISSUE DATE / TIME: 202408192134
ISSUE DATE / TIME: 202408192332
Unit Type and Rh: 7300
Unit Type and Rh: 7300
Unit Type and Rh: 7300

## 2023-02-12 LAB — CBC
HCT: 25.1 % — ABNORMAL LOW (ref 39.0–52.0)
HCT: 29.4 % — ABNORMAL LOW (ref 39.0–52.0)
HCT: 29.5 % — ABNORMAL LOW (ref 39.0–52.0)
HCT: 25.1 % — ABNORMAL LOW (ref 39.0–52.0)
Hemoglobin: 8.3 g/dL — ABNORMAL LOW (ref 13.0–17.0)
Hemoglobin: 9.7 g/dL — ABNORMAL LOW (ref 13.0–17.0)
Hemoglobin: 9.6 g/dL — ABNORMAL LOW (ref 13.0–17.0)
Hemoglobin: 8.4 g/dL — ABNORMAL LOW (ref 13.0–17.0)
MCH: 30 pg (ref 26.0–34.0)
MCH: 30.3 pg (ref 26.0–34.0)
MCH: 29.8 pg (ref 26.0–34.0)
MCH: 30.2 pg (ref 26.0–34.0)
MCHC: 33.1 g/dL (ref 30.0–36.0)
MCHC: 33 g/dL (ref 30.0–36.0)
MCHC: 32.5 g/dL (ref 30.0–36.0)
MCHC: 33.5 g/dL (ref 30.0–36.0)
MCV: 90.6 fL (ref 80.0–100.0)
MCV: 91.9 fL (ref 80.0–100.0)
MCV: 91.6 fL (ref 80.0–100.0)
MCV: 90.3 fL (ref 80.0–100.0)
Platelets: 116 10*3/uL — ABNORMAL LOW (ref 150–400)
Platelets: 119 10*3/uL — ABNORMAL LOW (ref 150–400)
Platelets: 124 10*3/uL — ABNORMAL LOW (ref 150–400)
Platelets: 119 10*3/uL — ABNORMAL LOW (ref 150–400)
RBC: 2.77 MIL/uL — ABNORMAL LOW (ref 4.22–5.81)
RBC: 3.2 MIL/uL — ABNORMAL LOW (ref 4.22–5.81)
RBC: 3.22 MIL/uL — ABNORMAL LOW (ref 4.22–5.81)
RBC: 2.78 MIL/uL — ABNORMAL LOW (ref 4.22–5.81)
RDW: 20.1 % — ABNORMAL HIGH (ref 11.5–15.5)
RDW: 21.2 % — ABNORMAL HIGH (ref 11.5–15.5)
RDW: 21.3 % — ABNORMAL HIGH (ref 11.5–15.5)
RDW: 21 % — ABNORMAL HIGH (ref 11.5–15.5)
WBC: 9.7 10*3/uL (ref 4.0–10.5)
WBC: 9 10*3/uL (ref 4.0–10.5)
WBC: 10.3 10*3/uL (ref 4.0–10.5)
WBC: 10 10*3/uL (ref 4.0–10.5)
nRBC: 0 % (ref 0.0–0.2)
nRBC: 0 % (ref 0.0–0.2)
nRBC: 0.2 % (ref 0.0–0.2)
nRBC: 0.2 % (ref 0.0–0.2)

## 2023-02-12 LAB — BASIC METABOLIC PANEL
Anion gap: 8 (ref 5–15)
BUN: 40 mg/dL — ABNORMAL HIGH (ref 8–23)
CO2: 15 mmol/L — ABNORMAL LOW (ref 22–32)
Calcium: 9.1 mg/dL (ref 8.9–10.3)
Chloride: 117 mmol/L — ABNORMAL HIGH (ref 98–111)
Creatinine, Ser: 2.46 mg/dL — ABNORMAL HIGH (ref 0.61–1.24)
GFR, Estimated: 28 mL/min — ABNORMAL LOW (ref >60)
Glucose, Bld: 93 mg/dL (ref 70–99)
Potassium: 4.8 mmol/L (ref 3.5–5.1)
Sodium: 140 mmol/L (ref 135–145)

## 2023-02-12 LAB — GLUCOSE, CAPILLARY
Glucose-Capillary: 103 mg/dL — ABNORMAL HIGH (ref 70–99)
Glucose-Capillary: 116 mg/dL — ABNORMAL HIGH (ref 70–99)
Glucose-Capillary: 80 mg/dL (ref 70–99)
Glucose-Capillary: 91 mg/dL (ref 70–99)
Glucose-Capillary: 92 mg/dL (ref 70–99)
Glucose-Capillary: 99 mg/dL (ref 70–99)

## 2023-02-12 LAB — PHOSPHORUS: Phosphorus: 4 mg/dL (ref 2.5–4.6)

## 2023-02-12 LAB — MAGNESIUM: Magnesium: 1.9 mg/dL (ref 1.7–2.4)

## 2023-02-12 LAB — PREPARE RBC (CROSSMATCH)

## 2023-02-12 SURGERY — COLONOSCOPY
Anesthesia: Monitor Anesthesia Care

## 2023-02-12 MED ORDER — LIDOCAINE 2% (20 MG/ML) 5 ML SYRINGE
INTRAMUSCULAR | Status: DC | PRN
  Administered 2023-02-12: 60 mg via INTRAVENOUS

## 2023-02-12 MED ORDER — SODIUM BICARBONATE 8.4 % IV SOLN
50.0000 meq | Freq: Once | INTRAVENOUS | Status: AC
  Administered 2023-02-12: 50 meq via INTRAVENOUS
  Filled 2023-02-12: qty 50

## 2023-02-12 MED ORDER — PROPOFOL 500 MG/50ML IV EMUL
INTRAVENOUS | Status: DC | PRN
  Administered 2023-02-12: 100 ug/kg/min via INTRAVENOUS

## 2023-02-12 MED ORDER — LACTATED RINGERS IV SOLN: INTRAVENOUS | Status: DC

## 2023-02-12 MED ORDER — CHLORHEXIDINE GLUCONATE CLOTH 2 % EX PADS: 6.0000 | MEDICATED_PAD | Freq: Every day | CUTANEOUS | Status: DC

## 2023-02-12 MED ORDER — MAGNESIUM SULFATE 2 GM/50ML IV SOLN
2.0000 g | Freq: Once | INTRAVENOUS | Status: AC
  Administered 2023-02-12: 2 g via INTRAVENOUS
  Filled 2023-02-12: qty 50

## 2023-02-12 MED ORDER — PROPOFOL 10 MG/ML IV BOLUS
INTRAVENOUS | Status: DC | PRN
Start: 2023-02-12 — End: 2023-02-12
  Administered 2023-02-12: 30 mg via INTRAVENOUS
  Administered 2023-02-12: 70 mg via INTRAVENOUS

## 2023-02-12 NOTE — Progress Notes (Signed)
Returned to unit after report received from endoscopy. Arrived via wheelchair with VSS.

## 2023-02-12 NOTE — Op Note (Signed)
[facility] Patient Name: Charles Krueger Procedure Date : 02/12/2023 MRN: [id number] Attending MD: Starr Lake. Myrtie Neither , MD, [id number] Date of Birth: 1955/10/30 CSN: [id number] Age: 67 Admit Type: Inpatient Procedure:                Colonoscopy Indications:              Hematochezia, Acute post hemorrhagic anemia                           Suspected post polypectomy bleeding. Colonoscopy                            with polypectomy at [facility] 6 days ago Providers:                Sherilyn Cooter L. Myrtie Neither, MD, Janae Sauce. Steele Berg, RN, Adin Hector, RN, Rozetta Nunnery, Technician Referring MD:             Critical care service Medicines:                Monitored Anesthesia Care Complications:            No immediate complications. Estimated Blood Loss:     Estimated blood loss: none. Procedure:                Pre-Anesthesia Assessment:                           - Prior to the procedure, a History and Physical                            was performed, and patient medications and                            allergies were reviewed. The patient's tolerance of                            previous anesthesia was also reviewed. The risks                            and benefits of the procedure and the sedation                            options and risks were discussed with the patient.                            All questions were answered, and informed consent                            was obtained. Prior Anticoagulants: The patient has                            taken no anticoagulant or antiplatelet agents. ASA  Grade Assessment: III - A patient with severe                            systemic disease. After reviewing the risks and                            benefits, the patient was deemed in satisfactory                            condition to undergo the procedure.                           After obtaining informed consent, the  colonoscope                            was passed under direct vision. Throughout the                            procedure, the patient's blood pressure, pulse, and                            oxygen saturations were monitored continuously. The                            CF-HQ190L ([id number] Olympus coloscope was                            introduced through the anus and advanced to the the                            cecum, identified by appendiceal orifice and                            ileocecal valve. The colonoscopy was somewhat                            difficult due to a redundant colon. Successful                            completion of the procedure was aided by using                            manual pressure and straightening and shortening                            the scope to obtain bowel loop reduction. The                            patient tolerated the procedure well. The quality                            of the bowel preparation was good. The ileocecal  valve, appendiceal orifice, and rectum were                            photographed. The bowel preparation used was                            MoviPrep via split dose instruction. Scope In: 10:33:14 AM Scope Out: 11:06:34 AM Scope Withdrawal Time: 0 hours 24 minutes 6 seconds  Total Procedure Duration: 0 hours 33 minutes 20 seconds  Findings:      The perianal and digital rectal examinations were normal.      Many diverticula were found in the entire colon.      A single non-bleeding diminutive erosion with mild surrounding edema was       found in the cecum. Most likely a polypectomy site. No active bleeding       or stigmata of bleeding from that.      Other than a few small blood-tinged diverticular stool balls, there was       no fresh or old blood in the colon.      A single (solitary) eight mm ulcer was found in the distal ascending       colon. Edges raised/edematous. No active  bleeding was present. Stigmata       of recent bleeding were present (flat red spot). This ulcer was       difficult to localize due to challenging colon anatomy from redundancy,       diverticulosis and deep haustral folds. For hemostasis, two hemostatic       clips were successfully placed (MR conditional). Clipping was       challenging due to ulcer location and morphology. First clip landed       adjacent to the ulcer. 2 additional clips were able to close the       polypectomy site well.      The exam was otherwise without abnormality on direct and retroflexion       views. Impression:               - Diverticulosis in the entire examined colon.                           - A single erosion in the cecum.                           - A single (solitary) ulcer in the distal ascending                            colon. Clips (MR conditional) were placed.                           - The examination was otherwise normal on direct                            and retroflexion views.                           - No specimens collected. Recommendation:           - Return patient to hospital ward for ongoing care.                           -  Resume regular diet.                           - CBC tomorrow morning (and sooner if patient has                            recurrence of bleeding).                           Bleeding had already stopped by the time of this                            procedure. With the additional intervention                            performed, it is unlikely there will be recurrent                            bleeding from this source. If no recurrence of                            bleeding by tomorrow and patient otherwise                            clinically stable, he may be ready for discharge                            home. The GI service will see this patient tomorrow                            to make that determination.                           When he is  discharged, he will need iron tablets                            and close follow-up with his GI provider at the                            [facility] and PCP to check a CBC 10 to 14 days later. Procedure Code(s):        --- Professional ---                           [phone], Colonoscopy, flexible; with control of                            bleeding, any method Diagnosis Code(s):        --- Professional ---                           K63.3, Ulcer of intestine                           K92.1, Melena (includes Hematochezia)  D62, Acute posthemorrhagic anemia                           K57.30, Diverticulosis of large intestine without                            perforation or abscess without bleeding CPT copyright 2022 [employer]. All rights reserved. The codes documented in this report are preliminary and upon coder review may  be revised to meet current compliance requirements. Raydon Chappuis L. Myrtie Neither, MD 02/12/2023 11:21:19 AM This report has been signed electronically. Number of Addenda: 0

## 2023-02-12 NOTE — Interval H&P Note (Signed)
History and Physical Interval Note:  02/12/2023 9:50 AM  Charles Krueger  has presented today for surgery, with the diagnosis of rectal bleeding, anemia.  The various methods of treatment have been discussed with the patient and family. After consideration of risks, benefits and other options for treatment, the patient has consented to  Procedure(s): COLONOSCOPY (N/A) as a surgical intervention.  The patient's history has been reviewed, patient examined, no change in status, stable for surgery.  I have reviewed the patient's chart and labs.  Questions were answered to the patient's satisfaction.    Continues to pass melena overnight and this morning.   Hgb 9.7 after overnight transfusions. Hemodynamically stable  [name]

## 2023-02-12 NOTE — Anesthesia Preprocedure Evaluation (Signed)
Anesthesia Evaluation  Patient identified by MRN, date of birth, ID band Patient awake    Reviewed: NPO status , Patient's Chart, lab work & pertinent test results, reviewed documented beta blocker date and time   Airway Mallampati: II  TM Distance: >3 FB     Dental no notable dental hx.    Pulmonary sleep apnea , pneumonia, COPD, Current Smoker   breath sounds clear to auscultation       Cardiovascular Exercise Tolerance: Good hypertension, + CAD, + Past MI and +CHF   Rhythm:Regular Rate:Normal     Neuro/Psych    GI/Hepatic PUD,GERD  ,,(+) neg Cirrhosis        Endo/Other  diabetes    Renal/GU CRFRenal disease     Musculoskeletal   Abdominal   Peds  Hematology  (+) Blood dyscrasia, anemia   Anesthesia Other Findings   Reproductive/Obstetrics                             Anesthesia Physical Anesthesia Plan  ASA: 3  Anesthesia Plan: MAC   Post-op Pain Management:    Induction: Intravenous  PONV Risk Score and Plan: 1 and Ondansetron  Airway Management Planned:   Additional Equipment:   Intra-op Plan:   Post-operative Plan:   Informed Consent: I have reviewed the patients History and Physical, chart, labs and discussed the procedure including the risks, benefits and alternatives for the proposed anesthesia with the patient or authorized representative who has indicated his/her understanding and acceptance.       Plan Discussed with: CRNA  Anesthesia Plan Comments:        Anesthesia Quick Evaluation

## 2023-02-12 NOTE — Progress Notes (Signed)
NAME:  Charles Krueger, MRN:  [id number], DOB:  08/05/1955, LOS: 1 ADMISSION DATE:  02/11/2023 CONSULTATION DATE:  02/11/2023 REFERRING MD:  Estell Harpin - EDP CHIEF COMPLAINT:  GIB, hypotension   History of Present Illness:  67 year old man who presented to [facility] ED 8/19 for blood in stool. PMHx significant for HTN, CAD with MI, HFpEF (Echo 04/2021 with EF 55-60%, +RWMAs with basal inferior akinesis, G1DD), COPD, OSA (on CPAP), sarcoidosis, T2DM. Recently underwent colonoscopy with [facility] 02/06/2023.  On arrival to ED, patient reported blood in his stool (BRBPR x 1-2 days, may have started 8/17 per patient). Endorses dizziness. Noted to be hypotensive in the ED lobby to 70s/50s. Per chart review, had 1 large (16mm) polyp removed from hepatic flexure via hot snare and 2 small 2mm polyps removed from cecum via cold forceps 8/14. Colonoscopy was also notable for colonic diverticulosis.   On ED presentation, hypothermic to 36.4, HR 83, BP 78/50, RR 16, SpO2 100%. Labs were notable for WBC 10.2, Hgb 5.3 (baseline ~14), Plt 158. Na 132, K 5.7, CO2 13, BUN/Cr 50/2.91 (baseline ~1.7-2.0), LFTs WNL. GI consulted for further evaluation.  PCCM consulted for ICU admission.  Pertinent Medical History:   Past Medical History:  Diagnosis Date   COPD (chronic obstructive pulmonary disease) (HCC)    Diabetes mellitus without complication (HCC)    GERD (gastroesophageal reflux disease)    HTN (hypertension)    Myocardial infarction (HCC)    silent Mi several years ago   OSA on CPAP    Pneumonia    pt reports was mentioned in 12/23 at [facility] visit   Sarcoidosis    Significant Hospital Events: Including procedures, antibiotic start and stop dates in addition to other pertinent events   8/19 - Presented to ED with BRBPR x 1 day + dizziness, s/p colonoscopy at [facility] 5 days ago. Hypotensive to 70s/50s. Hgb 5.3. 3U PRBCs ordered in ED. GI consulted. PCCM consulted for ICU admission.  Interim History / Subjective:  No  events. 4 more bloody Bms overnight  Objective:  Blood pressure 113/69, pulse 64, temperature 98.9 F (37.2 C), temperature source Oral, resp. rate (!) 22, height 5' 9.02" (1.753 m), weight 64.4 kg, SpO2 95%.    FiO2 (%):  [21 %] 21 %   Intake/Output Summary (Last 24 hours) at 02/12/2023 0745 Last data filed at 02/12/2023 0500 Gross per 24 hour  Intake 1380.33 ml  Output --  Net 1380.33 ml   Filed Weights   02/11/23 1851 02/12/23 0500  Weight: 66 kg 64.4 kg    Physical Examination: No distress on toilet Lungs clear Ext warm Aox3 Moves all 4 ext  H/H better Bicarb low query GI losses Plts dropping a bit  Resolved Hospital Problem List:    Assessment & Plan:  Shock, hypovolemic/hemorrhagic in the setting of GIB ABLA GIB Colonic diverticulosis Presented to ED for BRBPR x 1 day. Recent colonoscopy at [facility] 8/14 1 large (16mm) polyp removed from hepatic flexure via hot snare and 2 small 2mm polyps removed from cecum via cold forceps 8/14. Colonoscopy was also notable for colonic diverticulosis.  - Trend H/H - For scope today - Will give some bicarb (avoid acidemia, hypothermia, coagulopathy)  HTN CAD with MI HFpEF Echo 04/2021 with EF 55-60%, +RWMAs with basal inferior akinesis, G1DD. - Hold GDMT for now - Hold home antihypertensives - Hold ASA/statin for now  COPD OSA (on CPAP) Sarcoidosis - Supplemental O2 support as needed - CPAP QHS - Wean O2 for sat >  90% - Bronchodilators  (Brovana/Incruse, can use Yupelri instead if needed) + Albuterol PRN - Pulmonary hygiene  AKI on CKD stage 3b Baseline Cr ~1.7-2.0. Cr 2.39 on admission. Related to ABLA and hypovolemia.  Still stable - Avoid nephrotoxic agents as able - Ensure adequate renal perfusion - Check renal US  T2DM - SSI - CBGs Q4H - Goal CBG 140-180  Best Practice: (right click and "Reselect all SmartList Selections" daily)   Diet/type: NPO DVT prophylaxis: SCDs, no AC/DVT ppx in the setting of  active GIB GI prophylaxis: PPI Lines: N/A Foley:  N/A Code Status:  full code Last date of multidisciplinary goals of care discussion [Pending]  Myrla Halsted MD PCCM

## 2023-02-12 NOTE — Transfer of Care (Signed)
Immediate Anesthesia Transfer of Care Note  Patient: Charles Krueger  Procedure(s) Performed: COLONOSCOPY HEMOSTASIS CLIP PLACEMENT  Patient Location: PACU and Endoscopy Unit  Anesthesia Type:MAC  Level of Consciousness: awake and alert   Airway & Oxygen Therapy: Patient Spontanous Breathing  Post-op Assessment: Report given to RN and Post -op Vital signs reviewed and stable  Post vital signs: Reviewed and stable  Last Vitals:  Vitals Value Taken Time  BP 110/56 02/12/23 1112  Temp    Pulse    Resp 25 02/12/23 1114  SpO2    Vitals shown include unfiled device data.  Last Pain:  Vitals:   02/12/23 0834  TempSrc: Temporal  PainSc: 0-No pain      Patients Stated Pain Goal: 0 (02/11/23 1645)  Complications: No notable events documented.

## 2023-02-12 NOTE — Progress Notes (Signed)
Off floor to endoscopy

## 2023-02-13 DIAGNOSIS — D62 Acute posthemorrhagic anemia: Secondary | ICD-10-CM | POA: Diagnosis not present

## 2023-02-13 DIAGNOSIS — K922 Gastrointestinal hemorrhage, unspecified: Secondary | ICD-10-CM | POA: Diagnosis not present

## 2023-02-13 DIAGNOSIS — K921 Melena: Secondary | ICD-10-CM | POA: Diagnosis not present

## 2023-02-13 LAB — CBC
HCT: 24.9 % — ABNORMAL LOW (ref 39.0–52.0)
HCT: 26.5 % — ABNORMAL LOW (ref 39.0–52.0)
Hemoglobin: 8.4 g/dL — ABNORMAL LOW (ref 13.0–17.0)
Hemoglobin: 8.9 g/dL — ABNORMAL LOW (ref 13.0–17.0)
MCH: 30.9 pg (ref 26.0–34.0)
MCH: 30.4 pg (ref 26.0–34.0)
MCHC: 33.7 g/dL (ref 30.0–36.0)
MCHC: 33.6 g/dL (ref 30.0–36.0)
MCV: 91.5 fL (ref 80.0–100.0)
MCV: 90.4 fL (ref 80.0–100.0)
Platelets: 127 10*3/uL — ABNORMAL LOW (ref 150–400)
Platelets: 143 10*3/uL — ABNORMAL LOW (ref 150–400)
RBC: 2.72 MIL/uL — ABNORMAL LOW (ref 4.22–5.81)
RBC: 2.93 MIL/uL — ABNORMAL LOW (ref 4.22–5.81)
RDW: 20.7 % — ABNORMAL HIGH (ref 11.5–15.5)
RDW: 20.5 % — ABNORMAL HIGH (ref 11.5–15.5)
WBC: 11.3 10*3/uL — ABNORMAL HIGH (ref 4.0–10.5)
WBC: 9 10*3/uL (ref 4.0–10.5)
nRBC: 0 % (ref 0.0–0.2)
nRBC: 0 % (ref 0.0–0.2)

## 2023-02-13 LAB — BASIC METABOLIC PANEL
Anion gap: 6 (ref 5–15)
BUN: 30 mg/dL — ABNORMAL HIGH (ref 8–23)
CO2: 18 mmol/L — ABNORMAL LOW (ref 22–32)
Calcium: 9.1 mg/dL (ref 8.9–10.3)
Chloride: 111 mmol/L (ref 98–111)
Creatinine, Ser: 2.06 mg/dL — ABNORMAL HIGH (ref 0.61–1.24)
GFR, Estimated: 35 mL/min — ABNORMAL LOW (ref >60)
Glucose, Bld: 90 mg/dL (ref 70–99)
Potassium: 4.3 mmol/L (ref 3.5–5.1)
Sodium: 135 mmol/L (ref 135–145)

## 2023-02-13 LAB — GLUCOSE, CAPILLARY
Glucose-Capillary: 101 mg/dL — ABNORMAL HIGH (ref 70–99)
Glucose-Capillary: 87 mg/dL (ref 70–99)
Glucose-Capillary: 88 mg/dL (ref 70–99)
Glucose-Capillary: 113 mg/dL — ABNORMAL HIGH (ref 70–99)

## 2023-02-13 MED ORDER — IRON 325 (65 FE) MG PO TABS: 1.0000 | ORAL_TABLET | Freq: Every day | ORAL | 0 refills | Status: AC

## 2023-02-13 MED ORDER — BUTALBITAL-APAP-CAFFEINE 50-325-40 MG PO TABS
2.0000 | ORAL_TABLET | Freq: Four times a day (QID) | ORAL | Status: DC | PRN
  Administered 2023-02-13: 2 via ORAL
  Filled 2023-02-13: qty 2

## 2023-02-13 NOTE — ED Provider Notes (Signed)
[facility]M KIDNEY UNIT Provider Note   CSN: [id number] Arrival date & time: 02/11/23  1237     History  No chief complaint on file.   Charles Krueger is a 67 y.o. male.  Patient has a history of hypertension and recently had a colonoscopy at the [place].  He had a biopsy at that time.  Patient complains of weakness and rectal bleeding  The history is provided by the patient and medical records. No language interpreter was used.  Rectal Bleeding Quality:  Bright red Amount:  Moderate Timing:  Constant Chronicity:  New Context: not anal fissures   Similar prior episodes: no   Relieved by:  Nothing Worsened by:  Nothing Associated symptoms: no abdominal pain   Risk factors: no anticoagulant use        Home Medications Prior to Admission medications   Medication Sig Start Date End Date Taking? Authorizing Provider  albuterol (VENTOLIN HFA) 108 (90 Base) MCG/ACT inhaler Inhale 2 puffs into the lungs every 6 (six) hours as needed for wheezing or shortness of breath.   Yes [provider]  allopurinol (ZYLOPRIM) 100 MG tablet Take 50 mg by mouth daily.   Yes [provider]  aspirin 81 MG tablet Take 162 mg by mouth at bedtime.   Yes [provider]  atorvastatin (LIPITOR) 40 MG tablet Take 1 tablet (40 mg total) by mouth daily. 01/09/21  Yes [name], NP  carvedilol (COREG) 25 MG tablet Take 25 mg by mouth 2 (two) times daily with a meal.   Yes [provider]  cholecalciferol (VITAMIN D3) 25 MCG (1000 UNIT) tablet Take 1,000 Units by mouth daily.   Yes [provider]  empagliflozin (JARDIANCE) 10 MG TABS tablet Take 1 tablet (10 mg total) by mouth daily. 12/23/20  Yes [name], MD  Ferrous Sulfate (IRON) 325 (65 Fe) MG TABS Take 1 tablet (325 mg total) by mouth daily with breakfast. 02/13/23  Yes [name], MD  fluticasone [facility]) 50 MCG/ACT nasal spray Place 1 spray into both nostrils 2 (two) times  daily.   Yes [provider]  hydrALAZINE (APRESOLINE) 50 MG tablet Take 1.5 tablets (75 mg total) by mouth 3 (three) times daily. Patient taking differently: Take 50 mg by mouth 3 (three) times daily. 05/12/21  Yes [name], MD  isosorbide mononitrate (IMDUR) 30 MG 24 hr tablet Take 1 tablet (30 mg total) by mouth daily. 03/14/21  Yes [name], [name], [name]  ketotifen (ZADITOR) 0.025 % ophthalmic solution Place 1 drop into both eyes 2 (two) times daily as needed (itchy eyes).   Yes [provider]  montelukast (SINGULAIR) 10 MG tablet Take 10 mg by mouth daily.   Yes [provider]  Multiple Vitamin (MULTIVITAMIN WITH MINERALS) TABS tablet Take 1 tablet by mouth daily.   Yes [provider]  nicotine polacrilex (COMMIT) 2 MG lozenge Take 2 mg by mouth See admin instructions. Dissolve 1 lozenge in mouth every 2 hours as needed for nicotine.   Yes [provider]  Omega-3 Fatty Acids (FISH OIL) 1000 MG CPDR Take 1 capsule by mouth daily.   Yes [provider]  sacubitril-valsartan (ENTRESTO) 97-103 MG Take 1 tablet by mouth 2 times daily. 01/19/21  Yes [name], [name], [name]  spironolactone (ALDACTONE) 25 MG tablet Take 1 tablet (25 mg total) by mouth daily. 02/13/21  Yes [name], MD  Tiotropium Bromide-Olodaterol (STIOLTO RESPIMAT) 2.5-2.5 MCG/ACT AERS Inhale  2 puffs into the lungs in the morning.   Yes [provider]      Allergies    Zestril [lisinopril]    Review of Systems   Review of Systems  Constitutional:  Negative for appetite change and fatigue.  HENT:  Negative for congestion, ear discharge and sinus pressure.   Eyes:  Negative for discharge.  Respiratory:  Negative for cough.   Cardiovascular:  Negative for chest pain.  Gastrointestinal:  Positive for anal bleeding and hematochezia. Negative for abdominal pain and diarrhea.  Genitourinary:  Negative for frequency and hematuria.  Musculoskeletal:   Negative for back pain.  Skin:  Negative for rash.  Neurological:  Negative for seizures and headaches.  Psychiatric/Behavioral:  Negative for hallucinations.     Physical Exam Updated Vital Signs BP 131/80   Pulse 67   Temp 98.2 F (36.8 C) (Oral)   Resp 18   Ht 5' 9.02" (1.753 m)   Wt 64.4 kg   SpO2 100%   BMI 20.96 kg/m  Physical Exam Vitals and nursing note reviewed.  Constitutional:      Appearance: He is well-developed.  HENT:     Head: Normocephalic.     Nose: Nose normal.  Eyes:     General: No scleral icterus.    Conjunctiva/sclera: Conjunctivae normal.  Neck:     Thyroid: No thyromegaly.  Cardiovascular:     Rate and Rhythm: Normal rate and regular rhythm.     Heart sounds: No murmur heard.    No friction rub. No gallop.  Pulmonary:     Breath sounds: No stridor. No wheezing or rales.  Chest:     Chest wall: No tenderness.  Abdominal:     General: There is no distension.     Tenderness: There is no abdominal tenderness. There is no rebound.  Musculoskeletal:        General: Normal range of motion.     Cervical back: Neck supple.  Lymphadenopathy:     Cervical: No cervical adenopathy.  Skin:    Findings: No erythema or rash.  Neurological:     Mental Status: He is alert and oriented to person, place, and time.     Motor: No abnormal muscle tone.     Coordination: Coordination normal.  Psychiatric:        Behavior: Behavior normal.     ED Results / Procedures / Treatments   Labs (all labs ordered are listed, but only abnormal results are displayed) Labs Reviewed  CBC WITH DIFFERENTIAL/PLATELET - Abnormal; Notable for the following components:      Result Value   RBC 1.53 (*)    Hemoglobin 5.3 (*)    HCT 16.4 (*)    MCV 107.2 (*)    MCH 34.6 (*)    All other components within normal limits  COMPREHENSIVE METABOLIC PANEL - Abnormal; Notable for the following components:   Sodium 132 (*)    Potassium 5.7 (*)    CO2 13 (*)    Glucose, Bld  139 (*)    BUN 50 (*)    Creatinine, Ser 2.91 (*)    Total Protein 5.0 (*)    Albumin 2.9 (*)    AST 11 (*)    GFR, Estimated 23 (*)    All other components within normal limits  PROTIME-INR - Abnormal; Notable for the following components:   Prothrombin Time 15.3 (*)    All other components within normal limits  GLUCOSE, CAPILLARY - Abnormal; Notable for  the following components:   Glucose-Capillary 103 (*)    All other components within normal limits  BASIC METABOLIC PANEL - Abnormal; Notable for the following components:   Chloride 117 (*)    CO2 14 (*)    Glucose, Bld 109 (*)    BUN 44 (*)    Creatinine, Ser 2.52 (*)    GFR, Estimated 27 (*)    All other components within normal limits  CBC - Abnormal; Notable for the following components:   WBC 10.7 (*)    RBC 1.97 (*)    Hemoglobin 6.7 (*)    HCT 20.1 (*)    MCV 102.0 (*)    Platelets 139 (*)    All other components within normal limits  GLUCOSE, CAPILLARY - Abnormal; Notable for the following components:   Glucose-Capillary 109 (*)    All other components within normal limits  BASIC METABOLIC PANEL - Abnormal; Notable for the following components:   Chloride 117 (*)    CO2 15 (*)    BUN 40 (*)    Creatinine, Ser 2.46 (*)    GFR, Estimated 28 (*)    All other components within normal limits  CBC - Abnormal; Notable for the following components:   RBC 2.77 (*)    Hemoglobin 8.3 (*)    HCT 25.1 (*)    RDW 20.1 (*)    Platelets 116 (*)    All other components within normal limits  GLUCOSE, CAPILLARY - Abnormal; Notable for the following components:   Glucose-Capillary 103 (*)    All other components within normal limits  CBC - Abnormal; Notable for the following components:   RBC 3.22 (*)    Hemoglobin 9.6 (*)    HCT 29.5 (*)    RDW 21.3 (*)    Platelets 124 (*)    All other components within normal limits  CBC - Abnormal; Notable for the following components:   RBC 2.78 (*)    Hemoglobin 8.4 (*)    HCT  25.1 (*)    RDW 21.0 (*)    Platelets 119 (*)    All other components within normal limits  CBC - Abnormal; Notable for the following components:   RBC 3.20 (*)    Hemoglobin 9.7 (*)    HCT 29.4 (*)    RDW 21.2 (*)    Platelets 119 (*)    All other components within normal limits  CBC - Abnormal; Notable for the following components:   WBC 11.3 (*)    RBC 2.72 (*)    Hemoglobin 8.4 (*)    HCT 24.9 (*)    RDW 20.7 (*)    Platelets 127 (*)    All other components within normal limits  GLUCOSE, CAPILLARY - Abnormal; Notable for the following components:   Glucose-Capillary 116 (*)    All other components within normal limits  BASIC METABOLIC PANEL - Abnormal; Notable for the following components:   CO2 18 (*)    BUN 30 (*)    Creatinine, Ser 2.06 (*)    GFR, Estimated 35 (*)    All other components within normal limits  GLUCOSE, CAPILLARY - Abnormal; Notable for the following components:   Glucose-Capillary 101 (*)    All other components within normal limits  LACTIC ACID, PLASMA  LACTIC ACID, PLASMA  MAGNESIUM  PHOSPHORUS  GLUCOSE, CAPILLARY  GLUCOSE, CAPILLARY  GLUCOSE, CAPILLARY  GLUCOSE, CAPILLARY  GLUCOSE, CAPILLARY  GLUCOSE, CAPILLARY  CBC  I-STAT CHEM 8, ED  TYPE AND SCREEN  ABO/RH  PREPARE RBC (CROSSMATCH)    EKG None  Radiology US RENAL  Result Date: 02/12/2023 CLINICAL DATA:  Acute kidney injury EXAM: RENAL / URINARY TRACT ULTRASOUND COMPLETE COMPARISON:  MRI abdomen 07/14/2022. FINDINGS: Right Kidney: Renal measurements: 10.3 x 6.2 x 4.7 cm. Echogenicity is increased. There is no hydronephrosis. 2 renal cysts are identified. Upper pole cyst measures 1.5 x 1.2 x 1.2 cm. Mid pole cyst measures 1.3 x 1.1 x 1.3 cm. There is a small amount of perinephric fluid. Left Kidney: Renal measurements: 10.3 x 6.2 x 5.0 cm = volume: 167 mL. Echogenicity is increased. No hydronephrosis. There is a simple cyst in the lower pole measuring 3 x 4 x 4 mm. Echogenicity is  increased. There is mild perinephric fluid. Bladder: Appears normal for degree of bladder distention. Other: None. IMPRESSION: 1. Increased echogenicity in the kidneys bilaterally consistent with medical renal disease. 2.  No hydronephrosis. 3.  Small bilateral renal cysts. 4.  Small amount of perinephric fluid bilaterally, nonspecific. Electronically Signed   By: Darliss Cheney M.D.   On: 02/12/2023 20:45    Procedures Procedures    Medications Ordered in ED Medications  docusate sodium (COLACE) capsule 100 mg ( Oral MAR Unhold 02/12/23 1204)  polyethylene glycol (MIRALAX / GLYCOLAX) packet 17 g ( Oral MAR Unhold 02/12/23 1204)  ondansetron (ZOFRAN) injection 4 mg ( Intravenous MAR Unhold 02/12/23 1204)  pantoprazole (PROTONIX) injection 40 mg (40 mg Intravenous Given 02/13/23 1001)  insulin aspart (novoLOG) injection 0-9 Units ( Subcutaneous Not Given 02/13/23 1003)  arformoterol (BROVANA) nebulizer solution 15 mcg (15 mcg Nebulization Given 02/13/23 0930)  albuterol (PROVENTIL) (2.5 MG/3ML) 0.083% nebulizer solution 2.5 mg ( Nebulization MAR Unhold 02/12/23 1204)  revefenacin (YUPELRI) nebulizer solution 175 mcg (175 mcg Nebulization Given 02/13/23 0930)  Chlorhexidine Gluconate Cloth 2 % PADS 6 each (6 each Topical Not Given 02/13/23 1003)  lactated ringers infusion (0 mLs Intravenous Stopped 02/12/23 1150)  butalbital-acetaminophen-caffeine (FIORICET) 50-325-40 MG per tablet 2 tablet (2 tablets Oral Given 02/13/23 0022)  sodium chloride 0.9 % bolus 1,000 mL (0 mLs Intravenous Stopping previously hung infusion 02/11/23 1900)  0.9 %  sodium chloride infusion (Manually program via Guardrails IV Fluids) ( Intravenous New Bag/Given 02/11/23 1635)  tranexamic acid (CYKLOKAPRON) IVPB 1,000 mg (0 mg Intravenous Stopped 02/11/23 1824)  peg 3350 powder (MOVIPREP) kit 100 g (100 g Oral Given 02/11/23 1845)    And  peg 3350 powder (MOVIPREP) kit 100 g (100 g Oral Given 02/12/23 0330)  magnesium sulfate IVPB 2 g 50  mL (2 g Intravenous New Bag/Given 02/12/23 1448)  sodium bicarbonate injection 50 mEq (50 mEq Intravenous Given 02/12/23 1445)    ED Course/ Medical Decision Making/ A&P   CRITICAL CARE Performed by: Bethann Berkshire Total critical care time: 45 minutes Critical care time was exclusive of separately billable procedures and treating other patients. Critical care was necessary to treat or prevent imminent or life-threatening deterioration. Critical care was time spent personally by me on the following activities: development of treatment plan with patient and/or surrogate as well as nursing, discussions with consultants, evaluation of patient's response to treatment, examination of patient, obtaining history from patient or surrogate, ordering and performing treatments and interventions, ordering and review of laboratory studies, ordering and review of radiographic studies, pulse oximetry and re-evaluation of patient's condition.   Patient with anemia and rectal bleeding and hypotension.  His hypotension improved with normal saline and he will be transfused.  I  have spoke with critical care to admit the patient and GI will consult Click here for ABCD2, HEART and other calculatorsREFRESH Note before signing :1}                              Medical Decision Making Amount and/or Complexity of Data Reviewed Labs: ordered.  Risk Prescription drug management. Decision regarding hospitalization.    This patient presents to the ED for concern of rectal bleeding, this involves an extensive number of treatment options, and is a complaint that carries with it a high risk of complications and morbidity.  The differential diagnosis includes lower GI bleed   Co morbidities that complicate the patient evaluation  Hypertension   Additional history obtained:  Additional history obtained from patient External records from outside source obtained and reviewed including hospital records   Lab  Tests:  I Ordered, and personally interpreted labs.  The pertinent results include: Hemoglobin 5.3   Imaging Studies ordered:  No imaging Cardiac Monitoring: / EKG:  The patient was maintained on a cardiac monitor.  I personally viewed and interpreted the cardiac monitored which showed an underlying rhythm of: Normal sinus rhythm   Consultations Obtained:  I requested consultation with the critical care and GI,  and discussed lab and imaging findings as well as pertinent plan - they recommend: Critical care admit, patient will be transfused packed red blood cells.  GI will consult due to colonoscopy   Problem List / ED Course / Critical interventions / Medication management  Rectal bleeding from biopsy I ordered medication including packed red blood cells for anemia Reevaluation of the patient after these medicines showed that the patient improved I have reviewed the patients home medicines and have made adjustments as needed   Social Determinants of Health:  None   Test / Admission - Considered:  None  Rectal bleeding from colonoscopy and biopsy.  Patient is admitted to critical care with GI consult        Final Clinical Impression(s) / ED Diagnoses Final diagnoses:  None    Rx / DC Orders ED Discharge Orders          Ordered    Ferrous Sulfate (IRON) 325 (65 Fe) MG TABS  Daily with breakfast        02/13/23 1059              Bethann Berkshire, MD 02/13/23 1114

## 2023-02-13 NOTE — TOC Transition Note (Signed)
Transition of Care [facility]) - CM/SW Discharge Note   Patient Details  Name: Charles Krueger MRN: [id number] Date of Birth: 03/08/1956  Transition of [facility]) CM/SW Contact:  Tom-Johnson, Hershal Coria, RN Phone Number: 02/13/2023, 1:12 PM   Clinical Narrative:     Patient is scheduled for discharge today.  Readmission Risk Assessment done. Hospital f/u and discharge instructions on AVS. No TOC needs or recommendations noted. Wife, Hilda Lias to transport at discharge.  No further TOC needs noted.       Final next level of care: Home/Self Care Barriers to Discharge: Barriers Resolved   Patient Goals and CMS Choice CMS Medicare.gov Compare Post Acute Care list provided to:: Patient Choice offered to / list presented to : NA  Discharge Placement                  Patient to be transferred to facility by: Wife Name of family member notified: Hilda Lias    Discharge Plan and Services Additional resources added to the After Visit Summary for     Discharge Planning Services: CM Consult            DME Arranged: N/A DME Agency: NA       HH Arranged: NA HH Agency: NA        Social Determinants of Health (SDOH) Interventions SDOH Screenings   Food Insecurity: No Food Insecurity (01/05/2021)   Received from [facility]  Social Connections: Unknown (11/06/2021)   Received from [facility]  Stress: No Stress Concern Present (10/13/2020)   Received from [facility]  Tobacco Use: High Risk (02/11/2023)     Readmission Risk Interventions    02/13/2023    1:00 PM  Readmission Risk Prevention Plan  Transportation Screening Complete  PCP or Specialist Appt within 5-7 Days Complete  Home Care Screening Complete  Medication Review (RN CM) Referral to Pharmacy

## 2023-02-13 NOTE — Progress Notes (Signed)
DISCHARGE NOTE HOME Jaamal Sandra to be discharged Home per MD order.Diagnosis, treatment, medications, and follow up appointment discussed with patient who verbalized an understanding. Medications list discussed in detail.  Skin clean, dry and intact without evidence of skin break down, no evidence of skin tears noted. IV catheter discontinued intact.     An After Visit Summary (AVS) was printed and given to the patient. Patient escorted via wheelchair, and discharged home via private auto.  Tresa Endo, RN

## 2023-02-13 NOTE — Progress Notes (Signed)
[facility] Progress Note  Chief Complaint: Acute blood loss anemia from post polypectomy bleed  History:  Charles Krueger did well with his colonoscopy yesterday, report is on file.  Today he denies abdominal pain, and has had no further passage of melena or bright red blood per rectum since prior to his colonoscopy.  He denies chest pain, dyspnea abdominal pain or dysuria.   Objective:   Current Facility-Administered Medications:    albuterol (PROVENTIL) (2.5 MG/3ML) 0.083% nebulizer solution 2.5 mg, 2.5 mg, Nebulization, Q4H PRN, Pecola Leisure, Stephanie M, PA-C   arformoterol [facility]) nebulizer solution 15 mcg, 15 mcg, Nebulization, BID, 15 mcg at 02/13/23 0930 **AND** [DISCONTINUED] umeclidinium bromide (INCRUSE ELLIPTA) 62.5 MCG/ACT 1 puff, 1 puff, Inhalation, Daily, Cloyd Stagers M, PA-C   butalbital-acetaminophen-caffeine (FIORICET) [phone] MG per tablet 2 tablet, 2 tablet, Oral, Q6H PRN, Janalyn Shy, Subrina, MD, 2 tablet at 02/13/23 0022   Chlorhexidine Gluconate Cloth 2 % PADS 6 each, 6 each, Topical, Daily, Karie Fetch P, DO   docusate sodium (COLACE) capsule 100 mg, 100 mg, Oral, BID PRN, Pecola Leisure, Stephanie M, PA-C   insulin aspart (novoLOG) injection 0-9 Units, 0-9 Units, Subcutaneous, Q4H, Reese, Stephanie M, PA-C   lactated ringers infusion, , Intravenous, Continuous, Danis, Starr Lake III, MD, Stopped at 02/12/23 1150   ondansetron [facility]) injection 4 mg, 4 mg, Intravenous, Q6H PRN, Pecola Leisure, Stephanie M, PA-C   pantoprazole (PROTONIX) injection 40 mg, 40 mg, Intravenous, Q12H, Cloyd Stagers M, PA-C, 40 mg at 02/12/23 2118   polyethylene glycol (MIRALAX / GLYCOLAX) packet 17 g, 17 g, Oral, Daily PRN, Pecola Leisure, Stephanie M, PA-C   revefenacin (YUPELRI) nebulizer solution 175 mcg, 175 mcg, Nebulization, Daily, Steffanie Dunn, DO, 175 mcg at 02/13/23 0930   lactated ringers Stopped (02/12/23 1150)     Vital signs in last 24 hrs: Vitals:   02/13/23 0917 02/13/23 0930  BP: 131/80   Pulse: 67    Resp: 18   Temp:    SpO2: 100% 100%    Intake/Output Summary (Last 24 hours) at 02/13/2023 0956 Last data filed at 02/13/2023 0500 Gross per 24 hour  Intake --  Output 0 ml  Net 0 ml     Physical Exam Looks well HEENT: sclera anicteric, oral mucosa without lesions.  Poor dentition Neck: supple, no thyromegaly, JVD or lymphadenopathy Cardiac: RRR without murmurs, S1S2 heard, no peripheral edema Pulm: clear to auscultation bilaterally, normal RR and effort noted Abdomen: soft, no tenderness, with active bowel sounds. No guarding or palpable hepatosplenomegaly   Recent Labs:     Latest Ref Rng & Units 02/13/2023    3:48 AM 02/12/2023    9:28 PM 02/12/2023   12:59 PM  CBC  WBC 4.0 - 10.5 K/uL 11.3  10.0  10.3   Hemoglobin 13.0 - 17.0 g/dL 8.4  8.4  9.6   Hematocrit 39.0 - 52.0 % 24.9  25.1  29.5   Platelets 150 - 400 K/uL 127  119  124     Recent Labs  Lab 02/11/23 1607  INR 1.2      Latest Ref Rng & Units 02/13/2023    3:48 AM 02/12/2023    2:57 AM 02/11/2023    8:27 PM  CMP  Glucose 70 - 99 mg/dL 90  93  454   BUN 8 - 23 mg/dL 30  40  44   Creatinine 0.61 - 1.24 mg/dL 0.98  1.19  1.47   Sodium 135 - 145 mmol/L 135  140  137  Potassium 3.5 - 5.1 mmol/L 4.3  4.8  5.1   Chloride 98 - 111 mmol/L 111  117  117   CO2 22 - 32 mmol/L 18  15  14    Calcium 8.9 - 10.3 mg/dL 9.1  9.1  9.0      Radiologic studies:   Assessment & Plan  Assessment:  Hematochezia Acute blood loss anemia Post polypectomy bleeding, treated with colonoscopic clip placement on polypectomy site yesterday.  (Surveillance colonoscopy with polypectomy had been done at the [facility] a week ago)  This patient can be discharged home today from my perspective to follow-up soon with his outpatient PCP and GI providers at the [facility].  Additional recommendations in my colonoscopy report from yesterday.  Hospitalist will be notified.  Thank you for involving [place] in his care.  Charles Krueger Office: [phone]

## 2023-02-13 NOTE — Plan of Care (Signed)
  Problem: Education: Goal: Knowledge of General Education information will improve Description: Including pain rating scale, medication(s)/side effects and non-pharmacologic comfort measures Outcome: Adequate for Discharge   Problem: Health Behavior/Discharge Planning: Goal: Ability to manage health-related needs will improve Outcome: Adequate for Discharge   Problem: Clinical Measurements: Goal: Ability to maintain clinical measurements within normal limits will improve Outcome: Adequate for Discharge   Problem: Clinical Measurements: Goal: Ability to maintain clinical measurements within normal limits will improve Outcome: Adequate for Discharge Goal: Will remain free from infection Outcome: Adequate for Discharge Goal: Diagnostic test results will improve Outcome: Adequate for Discharge Goal: Respiratory complications will improve Outcome: Adequate for Discharge Goal: Cardiovascular complication will be avoided Outcome: Adequate for Discharge   Problem: Activity: Goal: Risk for activity intolerance will decrease Outcome: Adequate for Discharge   Problem: Nutrition: Goal: Adequate nutrition will be maintained Outcome: Adequate for Discharge   Problem: Coping: Goal: Level of anxiety will decrease Outcome: Adequate for Discharge   Problem: Elimination: Goal: Will not experience complications related to bowel motility Outcome: Adequate for Discharge Goal: Will not experience complications related to urinary retention Outcome: Adequate for Discharge

## 2023-02-13 NOTE — Anesthesia Postprocedure Evaluation (Signed)
Anesthesia Post Note  Patient: Rosser Shook  Procedure(s) Performed: COLONOSCOPY HEMOSTASIS CLIP PLACEMENT     Patient location during evaluation: PACU Anesthesia Type: MAC Level of consciousness: awake and alert Pain management: pain level controlled Vital Signs Assessment: post-procedure vital signs reviewed and stable Respiratory status: spontaneous breathing, nonlabored ventilation, respiratory function stable and patient connected to nasal cannula oxygen Cardiovascular status: stable and blood pressure returned to baseline Postop Assessment: no apparent nausea or vomiting Anesthetic complications: no   No notable events documented.  Last Vitals:  Vitals:   02/12/23 2150 02/13/23 0538  BP: 138/84 132/73  Pulse: 72 68  Resp:  13  Temp: 37.2 C 36.8 C  SpO2: 98% 97%    Last Pain:  Vitals:   02/13/23 0538  TempSrc: Oral  PainSc:                  Mariann Barter

## 2023-02-13 NOTE — Discharge Instructions (Addendum)
No changes to your meds other than start taking iron tablets: sent to [facility] pharmacy  Follow up with your [facility] providers: 10-14 days with blood count check  Seek care if bleeding recurs.

## 2023-02-13 NOTE — Discharge Summary (Signed)
Physician Discharge Summary  Patient ID: Charles Krueger MRN: [id number] DOB/AGE: 66-Dec-1957 66 y.o.  Admit date: 02/11/2023 Discharge date: 02/13/2023  Admission Diagnoses: Hemorrhagic shock  Discharge Diagnoses:  Principal Problem:   GIB (gastrointestinal bleeding) Active Problems:   Acute blood loss anemia   Hemorrhagic shock (HCC)   Chronic HFrEF (heart failure with reduced ejection fraction) (HCC)   Melena   Colon ulcer   Discharged Condition: good  Hospital Course:  Admitted for lower GI bleed after recent colonoscopy. Responded well to blood rescuscitation  Colonoscopy results: - Diverticulosis in the entire examined colon.  - A single erosion in the cecum.  - A single ( solitary) ulcer in the distal ascending colon. Clips ( MR conditional) were placed.  - The examination was otherwise normal on direct and retroflexion views.  - No specimens collected.  Hemoglobin remained stable, melena resolved.  Will go home with some iron supplements and followup with his [facility] provider in 10-14 days for CBC check.  Consults: GI   Discharge Exam: Blood pressure 131/80, pulse 67, temperature 98.2 F (36.8 C), temperature source Oral, resp. rate 18, height 5' 9.02" (1.753 m), weight 64.4 kg, SpO2 100%. No distress Lungs clear Abd soft Moves to command Self- transfers and ambulates on room air  Disposition: Discharge disposition: 01-Home or Self Care        Allergies as of 02/13/2023       Reactions   Zestril [lisinopril] Other (See Comments)   Acute renal failure        Medication List     TAKE these medications    albuterol 108 (90 Base) MCG/ACT inhaler Commonly known as: VENTOLIN HFA Inhale 2 puffs into the lungs every 6 (six) hours as needed for wheezing or shortness of breath.   allopurinol 100 MG tablet Commonly known as: ZYLOPRIM Take 50 mg by mouth daily.   aspirin 81 MG tablet Take 162 mg by mouth at bedtime.   atorvastatin 40 MG  tablet Commonly known as: LIPITOR Take 1 tablet (40 mg total) by mouth daily.   carvedilol 25 MG tablet Commonly known as: COREG Take 25 mg by mouth 2 (two) times daily with a meal.   cholecalciferol 25 MCG (1000 UNIT) tablet Commonly known as: VITAMIN D3 Take 1,000 Units by mouth daily.   empagliflozin 10 MG Tabs tablet Commonly known as: JARDIANCE Take 1 tablet (10 mg total) by mouth daily.   Entresto 97-103 MG Generic drug: sacubitril-valsartan Take 1 tablet by mouth 2 times daily.   Fish Oil 1000 MG Cpdr Take 1 capsule by mouth daily.   fluticasone 50 MCG/ACT nasal spray Commonly known as: FLONASE Place 1 spray into both nostrils 2 (two) times daily.   hydrALAZINE 50 MG tablet Commonly known as: APRESOLINE Take 1.5 tablets (75 mg total) by mouth 3 (three) times daily. What changed: how much to take   Iron 325 (65 Fe) MG Tabs Take 1 tablet (325 mg total) by mouth daily with breakfast.   isosorbide mononitrate 30 MG 24 hr tablet Commonly known as: IMDUR Take 1 tablet (30 mg total) by mouth daily.   ketotifen 0.025 % ophthalmic solution Commonly known as: ZADITOR Place 1 drop into both eyes 2 (two) times daily as needed (itchy eyes).   montelukast 10 MG tablet Commonly known as: SINGULAIR Take 10 mg by mouth daily.   multivitamin with minerals Tabs tablet Take 1 tablet by mouth daily.   nicotine polacrilex 2 MG lozenge Commonly known as: COMMIT Take 2  mg by mouth See admin instructions. Dissolve 1 lozenge in mouth every 2 hours as needed for nicotine.   spironolactone 25 MG tablet Commonly known as: ALDACTONE Take 1 tablet (25 mg total) by mouth daily.   Stiolto Respimat 2.5-2.5 MCG/ACT Aers Generic drug: Tiotropium Bromide-Olodaterol Inhale 2 puffs into the lungs in the morning.         Signed: [name] 02/13/2023, 11:01 AM

## 2023-02-16 ENCOUNTER — Encounter (HOSPITAL_COMMUNITY): Payer: Self-pay | Admitting: Gastroenterology

## 2023-05-31 ENCOUNTER — Emergency Department (HOSPITAL_COMMUNITY)
Admission: EM | Admit: 2023-05-31 | Discharge: 2023-05-31 | Disposition: A | Discharge status: Home / Self Care | Payer: No Typology Code available for payment source | Attending: Emergency Medicine | Admitting: Emergency Medicine

## 2023-05-31 ENCOUNTER — Other Ambulatory Visit: Payer: Self-pay

## 2023-05-31 DIAGNOSIS — Z7982 Long term (current) use of aspirin: Secondary | ICD-10-CM | POA: Diagnosis not present

## 2023-05-31 DIAGNOSIS — I2510 Atherosclerotic heart disease of native coronary artery without angina pectoris: Secondary | ICD-10-CM | POA: Diagnosis not present

## 2023-05-31 DIAGNOSIS — J449 Chronic obstructive pulmonary disease, unspecified: Secondary | ICD-10-CM | POA: Insufficient documentation

## 2023-05-31 DIAGNOSIS — R040 Epistaxis: Secondary | ICD-10-CM | POA: Diagnosis present

## 2023-05-31 DIAGNOSIS — Z7951 Long term (current) use of inhaled steroids: Secondary | ICD-10-CM | POA: Diagnosis not present

## 2023-05-31 DIAGNOSIS — I509 Heart failure, unspecified: Secondary | ICD-10-CM | POA: Diagnosis not present

## 2023-05-31 MED ORDER — LIDOCAINE-EPINEPHRINE-TETRACAINE (LET) TOPICAL GEL
3.0000 mL | Freq: Once | TOPICAL | Status: AC
  Administered 2023-05-31: 3 mL via TOPICAL
  Filled 2023-05-31: qty 3

## 2023-05-31 NOTE — ED Triage Notes (Signed)
Patient arrived with EMS from home reports persistent epistaxis for 3 days , denies nasal injury , received Afrin nasal sprays by EMS on both nostrils prior to arrival . He does not take anticoagulant medication .

## 2023-05-31 NOTE — ED Notes (Signed)
ENT consulted, monitor for 1h. If no recurrent bleeding pt may be discharged.

## 2023-05-31 NOTE — Consult Note (Signed)
Reason for Consult: Epistaxis Referring Physician: Jacalyn Lefevre, MD  Charles Krueger is an 67 y.o. male.  HPI: He has been having right-sided epistaxis for a couple of days on and off.  He got especially bad late last night early this morning.  He is not on anticoagulation therapy.  He was on nasal steroid spray and has been on that for a few years without any significant improvement of his symptoms overall.  Past Medical History:  Diagnosis Date   COPD (chronic obstructive pulmonary disease) (HCC)    Diabetes mellitus without complication (HCC)    GERD (gastroesophageal reflux disease)    HTN (hypertension)    Myocardial infarction (HCC)    silent Mi several years ago   OSA on CPAP    Pneumonia    pt reports was mentioned in 12/23 at [facility] visit   Sarcoidosis     Past Surgical History:  Procedure Laterality Date   COLONOSCOPY N/A 02/12/2023   Procedure: COLONOSCOPY;  Surgeon: Sherrilyn Rist, MD;  Location: [facility] ENDOSCOPY;  Service: Gastroenterology;  Laterality: N/A;   HEMOSTASIS CLIP PLACEMENT  02/12/2023   Procedure: HEMOSTASIS CLIP PLACEMENT;  Surgeon: Sherrilyn Rist, MD;  Location: [facility] ENDOSCOPY;  Service: Gastroenterology;;   INGUINAL HERNIA REPAIR Right 09/03/2022   Procedure: OPEN RIGHT INGUINAL HERNIA REPAIR WITH MESH;  Surgeon: Andria Meuse, MD;  Location: [facility] ORS;  Service: General;  Laterality: Right;   RIGHT/LEFT HEART CATH AND CORONARY ANGIOGRAPHY N/A 12/14/2020   Procedure: RIGHT/LEFT HEART CATH AND CORONARY ANGIOGRAPHY;  Surgeon: Marykay Lex, MD;  Location: [facility] INVASIVE CV LAB;  Service: Cardiovascular;  Laterality: N/A;    Family History  Problem Relation Age of Onset   CAD Mother     Social History:  reports that he has been smoking cigars. He started smoking about 22 years ago. He has never used smokeless tobacco. He reports current alcohol use of about 6.0 - 7.0 standard drinks of alcohol per week. He reports that he does not use  drugs.  Allergies:  Allergies  Allergen Reactions   Zestril [Lisinopril] Other (See Comments)    Acute renal failure    Medications: Reviewed  No results found for this or any previous visit (from the past 48 hour(s)).  No results found.  HQI:[id number] except as listed in admit H&P  Blood pressure 124/75, pulse 88, temperature 98.1 F (36.7 C), temperature source Oral, resp. rate 18, SpO2 99%.  PHYSICAL EXAM: Overall appearance:  Healthy appearing, in no distress Head:  Normocephalic, atraumatic. Ears: External ears are healthy. Nose: External nose is healthy in appearance. Internal nasal exam free of any lesions or obstruction.  Dried blood on the right side cleaned out with suction.  Topical Afrin/lidocaine applied in spray form and cotton pledgets.  Thorough examination of the nasal cavity was performed.  Bleeding site was not identified and there was no further bleeding. Oral Cavity/Pharynx:  There are no mucosal lesions or masses identified. Larynx/Hypopharynx: Deferred Neuro:  No identifiable neurologic deficits. Neck: No palpable neck masses.  Studies Reviewed: none  Procedures: Flexible fiberoptic endoscopy was performed of the right nasal cavity.  The entire nasal cavity was clear.  There is no old blood or active bleeding.  Bleeding site was not identified.  There is no masses or ulcerations or granulomata identified.   Assessment/Plan: Recurrent epistaxis, right side.  Bleeding site not identified.  No active bleeding currently.  Recommend observation in the emergency department for another hour to make sure  there is no further bleeding.  Recommend complete elimination of nasal steroid inhaler and never use 1 again in the future.  Recommend saline nasal spray 20-30 times daily especially during the winter months.  If there is any active bleeding start using Afrin spray.  Follow-up with me in the office if he has additional problems or return to the emergency room if  it is after hours.   R04.0   Medical Decision Making: #/Complex Problems: 3  Data Reviewed:1  Management:3 (1-Straightforward, 2-Low, 3-Moderate, 4-High)   Serena Colonel 05/31/2023, 12:47 PM

## 2023-05-31 NOTE — Discharge Instructions (Addendum)
Do not use the allergy nasal spray any longer.  Use nasal saline spray which can be purchased over-the-counter 20-30 times daily.  If there is any active bleeding, use Afrin nasal spray.  If there is any bleeding that does not stop either return to the emergency department or contact Dr. Lucky Rathke office.

## 2023-05-31 NOTE — ED Notes (Signed)
Pts right nostril bleeding. RN notified PA. 7.5 rapid rhino rocket placed at this time.

## 2023-05-31 NOTE — ED Provider Notes (Cosign Needed Addendum)
Charles Krueger Provider Note   CSN: [id number] Arrival date & time: 05/31/23  0544     History  Chief Complaint  Patient presents with   Epistaxis    Charles Krueger is a 67 y.o. male history of CHF, COPD, CAD, Paget's disease, pulmonary sarcoidosis, cirrhosis, GIB presented with epistaxis that started at 3 AM.  Patient states it woke him out of his sleep.  Patient has any recent trauma to the head or face region.  Patient denies any blood thinners or bleeding disorders.  Patient states he has had this in the past and needed lidocaine epinephrine gauze to help with the bleeding.  Patient did receive Afrin by EMS at 5 AM.  Home Medications Prior to Admission medications   Medication Sig Start Date End Date Taking? Authorizing Provider  albuterol (VENTOLIN HFA) 108 (90 Base) MCG/ACT inhaler Inhale 2 puffs into the lungs every 6 (six) hours as needed for wheezing or shortness of breath.    [provider]  allopurinol (ZYLOPRIM) 100 MG tablet Take 50 mg by mouth daily.    [provider]  aspirin 81 MG tablet Take 162 mg by mouth at bedtime.    [provider]  atorvastatin (LIPITOR) 40 MG tablet Take 1 tablet (40 mg total) by mouth daily. 01/09/21   [name], NP  carvedilol (COREG) 25 MG tablet Take 25 mg by mouth 2 (two) times daily with a meal.    [provider]  cholecalciferol (VITAMIN D3) 25 MCG (1000 UNIT) tablet Take 1,000 Units by mouth daily.    [provider]  empagliflozin (JARDIANCE) 10 MG TABS tablet Take 1 tablet (10 mg total) by mouth daily. 12/23/20   [name], MD  Ferrous Sulfate (IRON) 325 (65 Fe) MG TABS Take 1 tablet (325 mg total) by mouth daily with breakfast. 02/13/23   [name], MD  fluticasone [facility]) 50 MCG/ACT nasal spray Place 1 spray into both nostrils 2 (two) times daily.    [provider]  hydrALAZINE (APRESOLINE) 50 MG tablet Take 1.5  tablets (75 mg total) by mouth 3 (three) times daily. Patient taking differently: Take 50 mg by mouth 3 (three) times daily. 05/12/21   [name], MD  isosorbide mononitrate (IMDUR) 30 MG 24 hr tablet Take 1 tablet (30 mg total) by mouth daily. 03/14/21   [name], [name], [name]  ketotifen (ZADITOR) 0.025 % ophthalmic solution Place 1 drop into both eyes 2 (two) times daily as needed (itchy eyes).    [provider]  montelukast (SINGULAIR) 10 MG tablet Take 10 mg by mouth daily.    [provider]  Multiple Vitamin (MULTIVITAMIN WITH MINERALS) TABS tablet Take 1 tablet by mouth daily.    [provider]  nicotine polacrilex (COMMIT) 2 MG lozenge Take 2 mg by mouth See admin instructions. Dissolve 1 lozenge in mouth every 2 hours as needed for nicotine.    [provider]  Omega-3 Fatty Acids (FISH OIL) 1000 MG CPDR Take 1 capsule by mouth daily.    [provider]  sacubitril-valsartan (ENTRESTO) 97-103 MG Take 1 tablet by mouth 2 times daily. 01/19/21   [name], [name]  spironolactone (ALDACTONE) 25 MG tablet Take 1 tablet (25 mg total) by mouth daily. 02/13/21   [name], MD  Tiotropium Bromide-Olodaterol (STIOLTO RESPIMAT) 2.5-2.5 MCG/ACT AERS Inhale 2 puffs into the lungs in the morning.    [provider]  Allergies    Zestril [lisinopril]    Review of Systems   Review of Systems  HENT:  Positive for nosebleeds.     Physical Exam Updated Vital Signs BP (!) 189/177   Pulse 86   Resp (!) 27   SpO2 98%  Physical Exam Constitutional:      General: He is not in acute distress. HENT:     Nose: No nasal deformity, signs of injury or nasal tenderness.     Right Nostril: Epistaxis present. No foreign body, septal hematoma or occlusion.     Left Nostril: No foreign body, epistaxis, septal hematoma or occlusion.     Comments: No signs of trauma    Mouth/Throat:     Comments: No blood in the  oropharynx Neurological:     Mental Status: He is alert.     ED Results / Procedures / Treatments   Labs (all labs ordered are listed, but only abnormal results are displayed) Labs Reviewed - No data to display  EKG None  Radiology No results found.  Procedures Epistaxis Management  Date/Time: 05/31/2023 7:46 AM  Performed by: Netta Corrigan, PA-C Authorized by: Netta Corrigan, PA-C   Consent:    Consent obtained:  Verbal   Consent given by:  Patient   Risks, benefits, and alternatives were discussed: yes     Risks discussed:  Bleeding, nasal injury and pain   Alternatives discussed:  Alternative treatment Universal protocol:    Procedure explained and questions answered to patient or proxy's satisfaction: yes     Patient identity confirmed:  Verbally with patient Anesthesia:    Anesthesia method:  None Procedure details:    Treatment site:  R anterior   Treatment method:  Anterior pack   Treatment complexity:  Limited   Treatment episode: initial   Post-procedure details:    Assessment:  No improvement   Procedure completion:  Tolerated Epistaxis Management  Date/Time: 05/31/2023 12:18 PM  Performed by: Netta Corrigan, PA-C Authorized by: Netta Corrigan, PA-C   Consent:    Consent obtained:  Verbal   Consent given by:  Patient   Risks, benefits, and alternatives were discussed: yes     Risks discussed:  Bleeding, infection, nasal injury and pain Universal protocol:    Patient identity confirmed:  Verbally with patient Anesthesia:    Anesthesia method:  None Procedure details:    Treatment site:  R anterior   Treatment method:  Anterior pack   Treatment episode: recurring   Post-procedure details:    Assessment:  No improvement   Procedure completion:  Tolerated .Critical Care  Performed by: Netta Corrigan, PA-C Authorized by: Netta Corrigan, PA-C   Critical care provider statement:    Critical care time (minutes):  40   Critical care  time was exclusive of:  Separately billable procedures and treating other patients   Critical care was necessary to treat or prevent imminent or life-threatening deterioration of the following conditions: Epistaxis requiring multiple packings.   Critical care was time spent personally by me on the following activities:  Development of treatment plan with patient or surrogate, discussions with consultants, evaluation of patient's response to treatment, obtaining history from patient or surrogate, examination of patient, review of old charts, re-evaluation of patient's condition, pulse oximetry and ordering and performing treatments and interventions   I assumed direction of critical care for this patient from another provider in my specialty: no     Care discussed with comment:  ENT specialist on-call     Medications Ordered in ED Medications  lidocaine-EPINEPHrine-tetracaine (LET) topical gel (3 mLs Topical Given 05/31/23 8657)    ED Course/ Medical Decision Making/ A&P                                 Medical Decision Making  Tamara Demarest 67 y.o. presented today for epistaxis. Working DDx that I considered at this time includes, but not limited to, epistaxis, septal hematoma, nasal trauma, airway compromise.  R/o DDx: septal hematoma, nasal trauma, airway compromise: These are considered less likely due to history of present illness, physical exam, labs/imaging findings  Review of prior external notes: 02/13/2023 ED  Unique Tests and My Interpretation: None  Social Determinants of Health: none  Discussion with Independent Historian:  Family member  Discussion of Management of Tests: Pollyann Kennedy, MD ENT  Risk: Medium: prescription drug management  Risk Stratification Score: none  Staffed with Particia Nearing, MD  Plan: On exam patient was in no acute distress with stable vitals.  On exam patient does have epistaxis in the right nostril however I do not see any signs of septal hematoma or  trauma.  I did have the patient blow out the blood clots however patient does continue to still have hemorrhage and so we will give lidocaine and epi gauze and reevaluate in 20 minutes as this is help with the patient in the past.  Patient is not endorsing any other symptoms that would necessitate further workup at this time.  I went to go reevaluate the patient after the lidocaine epi gauze however bleeding did continue.  An anterior packing was placed in which patient was monitored for an hour after and did not have any rebleeds and states that he feels safe to go home.  I spoke to the patient following up in the next 2 days in the ER to have the packing removed and to be reevaluated.  Will also give patient ENT follow-up as well.  Did encourage patient to try a humidifier at home as the dry air is most likely contributing to his epistaxis and that he can try Afrin OTC over-the-counter once nasal spray if the packing is taken out and he begins to have another nosebleed.  At time of discharge patient had recurrent nosebleed.  Nasal packing was removed and a large blood clot fell out and another nasal packing was replaced.  Patient was monitored for another 40 minutes and began to rebleed again.  At this time since patient's failed Afrin, lidocaine epi, 2 rounds of nasal packing ENT was consulted states they will come down to see the patient.  After the second time patient's nose was packed by ENT and being watched for an hour patient's nose did not rebleed.  Patient was walked and did not rebleed either.  At this time patient stable to be discharged and follow-up with ENT in 5 days of the packing removed.  Patient given return precautions.  Patient was given return precautions. Patient stable for discharge at this time.  Patient verbalized understanding of plan.        Final Clinical Impression(s) / ED Diagnoses Final diagnoses:  Epistaxis    Rx / DC Orders ED Discharge Orders     None          Remi Deter 05/31/23 0919    Netta Corrigan, PA-C 05/31/23 1421    Jacalyn Lefevre, MD  06/01/23 0759  

## 2023-05-31 NOTE — Consult Note (Signed)
Had further bleeding.  Upon my inspection there was no bleeding.  Right nasal cavity was infiltrated with local anesthetic, 1% Xylocaine with epinephrine.  This was done along the septum and the inferior turbinate.  A long SlimLine Merisel packing was placed without difficulty.  This was inflated with local anesthetic solution.  There is no further bleeding.  Will have him follow-up in 5 days for removal.

## 2023-08-09 ENCOUNTER — Encounter (HOSPITAL_COMMUNITY): Payer: Self-pay

## 2023-08-09 ENCOUNTER — Other Ambulatory Visit: Payer: Self-pay

## 2023-08-09 ENCOUNTER — Observation Stay (HOSPITAL_COMMUNITY)
Admission: EM | Admit: 2023-08-09 | Discharge: 2023-08-10 | Disposition: A | Discharge status: Home / Self Care | Payer: No Typology Code available for payment source | Attending: Family Medicine | Admitting: Family Medicine

## 2023-08-09 DIAGNOSIS — I2510 Atherosclerotic heart disease of native coronary artery without angina pectoris: Secondary | ICD-10-CM | POA: Diagnosis not present

## 2023-08-09 DIAGNOSIS — D500 Iron deficiency anemia secondary to blood loss (chronic): Secondary | ICD-10-CM | POA: Diagnosis not present

## 2023-08-09 DIAGNOSIS — E119 Type 2 diabetes mellitus without complications: Secondary | ICD-10-CM

## 2023-08-09 DIAGNOSIS — D860 Sarcoidosis of lung: Secondary | ICD-10-CM | POA: Diagnosis not present

## 2023-08-09 DIAGNOSIS — I5022 Chronic systolic (congestive) heart failure: Secondary | ICD-10-CM | POA: Diagnosis not present

## 2023-08-09 DIAGNOSIS — Z7901 Long term (current) use of anticoagulants: Secondary | ICD-10-CM | POA: Diagnosis not present

## 2023-08-09 DIAGNOSIS — D649 Anemia, unspecified: Principal | ICD-10-CM | POA: Diagnosis present

## 2023-08-09 DIAGNOSIS — E1122 Type 2 diabetes mellitus with diabetic chronic kidney disease: Secondary | ICD-10-CM | POA: Insufficient documentation

## 2023-08-09 DIAGNOSIS — I130 Hypertensive heart and chronic kidney disease with heart failure and stage 1 through stage 4 chronic kidney disease, or unspecified chronic kidney disease: Secondary | ICD-10-CM | POA: Insufficient documentation

## 2023-08-09 DIAGNOSIS — N1832 Chronic kidney disease, stage 3b: Secondary | ICD-10-CM | POA: Diagnosis not present

## 2023-08-09 DIAGNOSIS — J449 Chronic obstructive pulmonary disease, unspecified: Secondary | ICD-10-CM | POA: Diagnosis present

## 2023-08-09 DIAGNOSIS — E785 Hyperlipidemia, unspecified: Secondary | ICD-10-CM | POA: Insufficient documentation

## 2023-08-09 DIAGNOSIS — Z7984 Long term (current) use of oral hypoglycemic drugs: Secondary | ICD-10-CM | POA: Insufficient documentation

## 2023-08-09 DIAGNOSIS — Z79899 Other long term (current) drug therapy: Secondary | ICD-10-CM | POA: Insufficient documentation

## 2023-08-09 DIAGNOSIS — G4733 Obstructive sleep apnea (adult) (pediatric): Secondary | ICD-10-CM | POA: Diagnosis present

## 2023-08-09 DIAGNOSIS — D509 Iron deficiency anemia, unspecified: Secondary | ICD-10-CM

## 2023-08-09 DIAGNOSIS — F17210 Nicotine dependence, cigarettes, uncomplicated: Secondary | ICD-10-CM | POA: Diagnosis not present

## 2023-08-09 DIAGNOSIS — F1090 Alcohol use, unspecified, uncomplicated: Secondary | ICD-10-CM | POA: Insufficient documentation

## 2023-08-09 LAB — COMPREHENSIVE METABOLIC PANEL
ALT: 16 U/L (ref 0–44)
AST: 15 U/L (ref 15–41)
Albumin: 3.2 g/dL — ABNORMAL LOW (ref 3.5–5.0)
Alkaline Phosphatase: 129 U/L — ABNORMAL HIGH (ref 38–126)
Anion gap: 8 (ref 5–15)
BUN: 27 mg/dL — ABNORMAL HIGH (ref 8–23)
CO2: 20 mmol/L — ABNORMAL LOW (ref 22–32)
Calcium: 10.4 mg/dL — ABNORMAL HIGH (ref 8.9–10.3)
Chloride: 112 mmol/L — ABNORMAL HIGH (ref 98–111)
Creatinine, Ser: 1.94 mg/dL — ABNORMAL HIGH (ref 0.61–1.24)
GFR, Estimated: 37 mL/min — ABNORMAL LOW (ref >60)
Glucose, Bld: 103 mg/dL — ABNORMAL HIGH (ref 70–99)
Potassium: 5 mmol/L (ref 3.5–5.1)
Sodium: 140 mmol/L (ref 135–145)
Total Bilirubin: 0.6 mg/dL (ref 0.0–1.2)
Total Protein: 6.7 g/dL (ref 6.5–8.1)

## 2023-08-09 LAB — CBC WITH DIFFERENTIAL/PLATELET
Abs Immature Granulocytes: 0.03 10*3/uL (ref 0.00–0.07)
Basophils Absolute: 0 10*3/uL (ref 0.0–0.1)
Basophils Relative: 0 %
Eosinophils Absolute: 0.4 10*3/uL (ref 0.0–0.5)
Eosinophils Relative: 5 %
HCT: 23.2 % — ABNORMAL LOW (ref 39.0–52.0)
Hemoglobin: 6.4 g/dL — CL (ref 13.0–17.0)
Immature Granulocytes: 0 %
Lymphocytes Relative: 27 %
Lymphs Abs: 1.9 10*3/uL (ref 0.7–4.0)
MCH: 21.5 pg — ABNORMAL LOW (ref 26.0–34.0)
MCHC: 27.6 g/dL — ABNORMAL LOW (ref 30.0–36.0)
MCV: 77.9 fL — ABNORMAL LOW (ref 80.0–100.0)
Monocytes Absolute: 0.8 10*3/uL (ref 0.1–1.0)
Monocytes Relative: 12 %
Neutro Abs: 3.9 10*3/uL (ref 1.7–7.7)
Neutrophils Relative %: 56 %
Platelets: 259 10*3/uL (ref 150–400)
RBC: 2.98 MIL/uL — ABNORMAL LOW (ref 4.22–5.81)
RDW: 20 % — ABNORMAL HIGH (ref 11.5–15.5)
WBC: 7 10*3/uL (ref 4.0–10.5)
nRBC: 0.4 % — ABNORMAL HIGH (ref 0.0–0.2)

## 2023-08-09 LAB — PREPARE RBC (CROSSMATCH)

## 2023-08-09 LAB — POC OCCULT BLOOD, ED: Fecal Occult Bld: NEGATIVE

## 2023-08-09 MED ORDER — SODIUM CHLORIDE 0.9% IV SOLUTION: Freq: Once | INTRAVENOUS | Status: AC

## 2023-08-09 NOTE — ED Triage Notes (Signed)
Pt sent for further evaluation of hbg of 6.7; denies black or bloody stools, not on thinners; denies pain; endorses fatigue

## 2023-08-09 NOTE — ED Provider Notes (Signed)
[place] EMERGENCY DEPARTMENT AT [facility] Provider Note   CSN: [id number] Arrival date & time: 08/09/23  1352     History  No chief complaint on file.   Charles Krueger is a 68 y.o. male.  The history is provided by the patient and medical records. No language interpreter was used.     Charles Krueger is a 68 year-old male that presents to [facility] ED under direction of his endocrinologist for a hemoglobin of 6.8 that was discovered incidentally on routine labs. Patient has a significant past medical history for COPD, CHF, CKD, Pagets disease, pulmonary sarcoidosis, cirrhosis, and CAD. Patient denies hematemesis, black or bloody stools, hematuria, trauma, ecchymosis, abdominal pain, headache, or ysncope. Patient endorses fatigue and occasional chest pain localized to the left on exertion with shortness of breath that resolves with rest- he states this problem is not new. Patient has had a gastrointestinal bleed in the past, but states that he is not experiencing any similar symptoms. Patient is not on blood thinners.   Home Medications Prior to Admission medications   Medication Sig Start Date End Date Taking? Authorizing Provider  albuterol (VENTOLIN HFA) 108 (90 Base) MCG/ACT inhaler Inhale 2 puffs into the lungs every 6 (six) hours as needed for wheezing or shortness of breath.    [provider]  allopurinol (ZYLOPRIM) 100 MG tablet Take 50 mg by mouth daily.    [provider]  aspirin 81 MG tablet Take 162 mg by mouth at bedtime.    [provider]  atorvastatin (LIPITOR) 40 MG tablet Take 1 tablet (40 mg total) by mouth daily. 01/09/21   [name], NP  carvedilol (COREG) 25 MG tablet Take 25 mg by mouth 2 (two) times daily with a meal.    [provider]  cholecalciferol (VITAMIN D3) 25 MCG (1000 UNIT) tablet Take 1,000 Units by mouth daily.    [provider]  empagliflozin (JARDIANCE) 10 MG TABS tablet Take 1 tablet (10 mg  total) by mouth daily. 12/23/20   [name], MD  Ferrous Sulfate (IRON) 325 (65 Fe) MG TABS Take 1 tablet (325 mg total) by mouth daily with breakfast. 02/13/23   [name], MD  fluticasone [facility]) 50 MCG/ACT nasal spray Place 1 spray into both nostrils 2 (two) times daily.    [provider]  hydrALAZINE (APRESOLINE) 50 MG tablet Take 1.5 tablets (75 mg total) by mouth 3 (three) times daily. Patient taking differently: Take 50 mg by mouth 3 (three) times daily. 05/12/21   [name], MD  isosorbide mononitrate (IMDUR) 30 MG 24 hr tablet Take 1 tablet (30 mg total) by mouth daily. 03/14/21   [name], [name], [name]  ketotifen (ZADITOR) 0.025 % ophthalmic solution Place 1 drop into both eyes 2 (two) times daily as needed (itchy eyes).    [provider]  montelukast (SINGULAIR) 10 MG tablet Take 10 mg by mouth daily.    [provider]  Multiple Vitamin (MULTIVITAMIN WITH MINERALS) TABS tablet Take 1 tablet by mouth daily.    [provider]  nicotine polacrilex (COMMIT) 2 MG lozenge Take 2 mg by mouth See admin instructions. Dissolve 1 lozenge in mouth every 2 hours as needed for nicotine.    [provider]  Omega-3 Fatty Acids (FISH OIL) 1000 MG CPDR Take 1 capsule by mouth daily.    [provider]  sacubitril-valsartan (ENTRESTO) 97-103 MG Take 1 tablet by mouth 2 times daily. 01/19/21  [place], [place], [place]  spironolactone (ALDACTONE) 25 MG tablet Take 1 tablet (25 mg total) by mouth daily. 02/13/21   [name], MD  Tiotropium Bromide-Olodaterol (STIOLTO RESPIMAT) 2.5-2.5 MCG/ACT AERS Inhale 2 puffs into the lungs in the morning.    [provider]      Allergies    Zestril [lisinopril]    Review of Systems   Review of Systems  All other systems reviewed and are negative.   Physical Exam Updated Vital Signs BP (!) 144/63 (BP Location: Right Arm)   Pulse 71   Temp 98.1 F (36.7 C) (Oral)   Resp  18   Ht 5\' 9"  (1.753 m)   Wt 79.4 kg   SpO2 (!) 9%   BMI 25.84 kg/m  Physical Exam Vitals and nursing note reviewed.  Constitutional:      General: He is not in acute distress.    Appearance: He is well-developed.  HENT:     Head: Atraumatic.  Eyes:     Conjunctiva/sclera: Conjunctivae normal.  Cardiovascular:     Rate and Rhythm: Normal rate and regular rhythm.     Pulses: Normal pulses.     Heart sounds: Normal heart sounds.  Pulmonary:     Effort: Pulmonary effort is normal.     Breath sounds: Normal breath sounds.  Abdominal:     Palpations: Abdomen is soft.     Tenderness: There is no abdominal tenderness.  Genitourinary:    Comments: Chaperone present during exam.  Normal rectal tone no obvious mass normal color stool on glove Musculoskeletal:     Cervical back: Neck supple.  Skin:    Findings: No rash.  Neurological:     Mental Status: He is alert.     ED Results / Procedures / Treatments   Labs (all labs ordered are listed, but only abnormal results are displayed) Labs Reviewed  CBC WITH DIFFERENTIAL/PLATELET - Abnormal; Notable for the following components:      Result Value   RBC 2.98 (*)    Hemoglobin 6.4 (*)    HCT 23.2 (*)    MCV 77.9 (*)    MCH 21.5 (*)    MCHC 27.6 (*)    RDW 20.0 (*)    nRBC 0.4 (*)    All other components within normal limits  COMPREHENSIVE METABOLIC PANEL - Abnormal; Notable for the following components:   Chloride 112 (*)    CO2 20 (*)    Glucose, Bld 103 (*)    BUN 27 (*)    Creatinine, Ser 1.94 (*)    Calcium 10.4 (*)    Albumin 3.2 (*)    Alkaline Phosphatase 129 (*)    GFR, Estimated 37 (*)    All other components within normal limits  POC OCCULT BLOOD, ED  POC OCCULT BLOOD, ED  TYPE AND SCREEN  PREPARE RBC (CROSSMATCH)    EKG None  Radiology No results found.  Procedures .Critical Care  Performed by: Fayrene Helper, PA-C Authorized by: Fayrene Helper, PA-C   Critical care provider statement:    Critical  care time (minutes):  30   Critical care was time spent personally by me on the following activities:  Development of treatment plan with patient or surrogate, discussions with consultants, evaluation of patient's response to treatment, examination of patient, ordering and review of laboratory studies, ordering and review of radiographic studies, ordering and performing treatments and interventions, pulse oximetry, re-evaluation of patient's condition and review of old charts  Medications Ordered in ED Medications  0.9 %  sodium chloride infusion (Manually program via Guardrails IV Fluids) ( Intravenous New Bag/Given 08/09/23 2235)    ED Course/ Medical Decision Making/ A&P                                 Medical Decision Making  BP (!) 143/87   Pulse 80   Temp 98.1 F (36.7 C) (Oral)   Resp 20   Ht 5\' 9"  (1.753 m)   Wt 79.4 kg   SpO2 92%   BMI 25.84 kg/m   11:43 PM  Ulmer Degen is a 68 year-old male that presents to [facility] ED under direction of his endocrinologist for a hemoglobin of 6.8 that was discovered incidentally on routine labs. Patient has a significant past medical history for COPD, CHF, CKD, Pagets disease, pulmonary sarcoidosis, cirrhosis, and CAD. Patient denies hematemesis, black or bloody stools, hematuria, trauma, ecchymosis, abdominal pain, headache, or ysncope. Patient endorses fatigue and occasional chest pain localized to the left on exertion with shortness of breath that resolves with rest- he states this problem is not new. Patient has had a gastrointestinal bleed in the past, but states that he is not experiencing any similar symptoms. Patient is not on blood thinners.   On exam, elderly male appears to be in no acute discomfort.  Rectal exam without any gross blood.  Hemoccult negative.  Abdomen is soft nontender.  -Labs ordered, independently viewed and interpreted by me.  Labs remarkable for hemoglobin of 6.4.  Patient's baseline is around 9 approximate 5  months ago.  Will start blood transfusion 2 unit. -The patient was maintained on a cardiac monitor.  I personally viewed and interpreted the cardiac monitored which showed an underlying rhythm of: NSR -Imaging not considered during this visit -This patient presents to the ED for concern of abnormal labs, this involves an extensive number of treatment options, and is a complaint that carries with it a high risk of complications and morbidity.  The differential diagnosis includes anemia, electrolytes imbalance, lab error, GI bleed, acute on chronic anemia -Co morbidities that complicate the patient evaluation includes GIB, CHF, CKD -Treatment includes blood transfusion -Reevaluation of the patient after these medicines showed that the patient improved -PCP office notes or outside notes reviewed -Discussion with specialist [facility] Dr. Loney Loh who agrees to admit pt for blood transfusion -Escalation to admission/observation considered: patient is agreeable with admission for blood transfusion          Final Clinical Impression(s) / ED Diagnoses Final diagnoses:  Symptomatic anemia    Rx / DC Orders ED Discharge Orders     None         Fayrene Helper, PA-C 08/09/23 2346    Laurence Spates, MD 08/10/23 0001

## 2023-08-09 NOTE — ED Notes (Signed)
Verbal consent given for mse

## 2023-08-09 NOTE — ED Provider Triage Note (Signed)
Emergency Medicine Provider Triage Evaluation Note  Charles Krueger , a 68 y.o. male  was evaluated in triage.  Pt complains of anemia.  Patient had blood work done with his primary care the other day and has a hemoglobin of 6.8.  Was advised to come into the ED.  Endorses generalized fatigue, which has been going on for a while now.  No dizziness, lightheadedness, black/tarry stools, or bright red blood per rectum.  No blood thinner use.  Review of Systems  Positive: Anemia Negative: Blood thinner use, blood in stools, abdominal pain  Physical Exam  BP (!) 140/73   Pulse 68   Temp 98 F (36.7 C)   Resp 18   Wt 79.4 kg   SpO2 97%   BMI 25.83 kg/m  Gen:   Awake, no distress   Resp:  Normal effort  MSK:   Moves extremities without difficulty  Other:    Medical Decision Making  Medically screening exam initiated at 3:26 PM.  Appropriate orders placed.  Charles Krueger was informed that the remainder of the evaluation will be completed by another provider, this initial triage assessment does not replace that evaluation, and the importance of remaining in the ED until their evaluation is complete.   Maxwell Marion, PA-C 08/09/23 1527

## 2023-08-10 DIAGNOSIS — D649 Anemia, unspecified: Secondary | ICD-10-CM | POA: Diagnosis not present

## 2023-08-10 LAB — HEMOGLOBIN A1C
Hgb A1c MFr Bld: 5.7 % — ABNORMAL HIGH (ref 4.8–5.6)
Mean Plasma Glucose: 116.89 mg/dL

## 2023-08-10 LAB — IRON AND TIBC
Iron: 25 ug/dL — ABNORMAL LOW (ref 45–182)
Saturation Ratios: 5 % — ABNORMAL LOW (ref 17.9–39.5)
TIBC: 517 ug/dL — ABNORMAL HIGH (ref 250–450)
UIBC: 492 ug/dL

## 2023-08-10 LAB — RETICULOCYTES
Immature Retic Fract: 12.1 % (ref 2.3–15.9)
RBC.: 3.62 MIL/uL — ABNORMAL LOW (ref 4.22–5.81)
Retic Count, Absolute: 50.1 10*3/uL (ref 19.0–186.0)
Retic Ct Pct: 1.4 % (ref 0.4–3.1)

## 2023-08-10 LAB — HIV ANTIBODY (ROUTINE TESTING W REFLEX): HIV Screen 4th Generation wRfx: NONREACTIVE

## 2023-08-10 LAB — FERRITIN: Ferritin: 9 ng/mL — ABNORMAL LOW (ref 24–336)

## 2023-08-10 LAB — HEMOGLOBIN AND HEMATOCRIT, BLOOD
HCT: 29.6 % — ABNORMAL LOW (ref 39.0–52.0)
Hemoglobin: 8.6 g/dL — ABNORMAL LOW (ref 13.0–17.0)

## 2023-08-10 LAB — VITAMIN B12: Vitamin B-12: 580 pg/mL (ref 180–914)

## 2023-08-10 LAB — FOLATE: Folate: 26 ng/mL (ref >5.9)

## 2023-08-10 LAB — VITAMIN D 25 HYDROXY (VIT D DEFICIENCY, FRACTURES): Vit D, 25-Hydroxy: 63.14 ng/mL (ref 30–100)

## 2023-08-10 MED ORDER — ACETAMINOPHEN 325 MG PO TABS: 650.0000 mg | ORAL_TABLET | Freq: Four times a day (QID) | ORAL | Status: DC | PRN

## 2023-08-10 MED ORDER — ISOSORBIDE MONONITRATE ER 30 MG PO TB24
30.0000 mg | ORAL_TABLET | Freq: Every day | ORAL | Status: DC
  Administered 2023-08-10: 30 mg via ORAL
  Filled 2023-08-10: qty 1

## 2023-08-10 MED ORDER — SPIRONOLACTONE 25 MG PO TABS
25.0000 mg | ORAL_TABLET | Freq: Every day | ORAL | Status: DC
  Administered 2023-08-10: 25 mg via ORAL
  Filled 2023-08-10: qty 1

## 2023-08-10 MED ORDER — FLUTICASONE PROPIONATE 50 MCG/ACT NA SUSP: 1.0000 | Freq: Two times a day (BID) | NASAL | Status: DC

## 2023-08-10 MED ORDER — UMECLIDINIUM BROMIDE 62.5 MCG/ACT IN AEPB
1.0000 | INHALATION_SPRAY | Freq: Every day | RESPIRATORY_TRACT | Status: DC
  Filled 2023-08-10: qty 7

## 2023-08-10 MED ORDER — ALBUTEROL SULFATE (2.5 MG/3ML) 0.083% IN NEBU: 2.5000 mg | INHALATION_SOLUTION | Freq: Four times a day (QID) | RESPIRATORY_TRACT | Status: DC | PRN

## 2023-08-10 MED ORDER — HYDRALAZINE HCL 50 MG PO TABS
50.0000 mg | ORAL_TABLET | Freq: Three times a day (TID) | ORAL | Status: DC
  Administered 2023-08-10: 50 mg via ORAL
  Filled 2023-08-10: qty 1

## 2023-08-10 MED ORDER — SACUBITRIL-VALSARTAN 97-103 MG PO TABS
1.0000 | ORAL_TABLET | Freq: Two times a day (BID) | ORAL | Status: DC
  Administered 2023-08-10: 1 via ORAL
  Filled 2023-08-10 (×3): qty 1

## 2023-08-10 MED ORDER — ACETAMINOPHEN 650 MG RE SUPP: 650.0000 mg | Freq: Four times a day (QID) | RECTAL | Status: DC | PRN

## 2023-08-10 MED ORDER — CARVEDILOL 12.5 MG PO TABS
25.0000 mg | ORAL_TABLET | Freq: Two times a day (BID) | ORAL | Status: DC
  Administered 2023-08-10: 25 mg via ORAL
  Filled 2023-08-10: qty 2

## 2023-08-10 MED ORDER — ARFORMOTEROL TARTRATE 15 MCG/2ML IN NEBU: 15.0000 ug | INHALATION_SOLUTION | Freq: Two times a day (BID) | RESPIRATORY_TRACT | Status: DC

## 2023-08-10 MED ORDER — FLUTICASONE PROPIONATE 50 MCG/ACT NA SUSP
1.0000 | Freq: Two times a day (BID) | NASAL | Status: DC
  Filled 2023-08-10: qty 16

## 2023-08-10 MED ORDER — ATORVASTATIN CALCIUM 40 MG PO TABS
40.0000 mg | ORAL_TABLET | Freq: Every day | ORAL | Status: DC
  Administered 2023-08-10: 40 mg via ORAL
  Filled 2023-08-10: qty 1

## 2023-08-10 MED ORDER — MONTELUKAST SODIUM 10 MG PO TABS
10.0000 mg | ORAL_TABLET | Freq: Every day | ORAL | Status: DC
  Administered 2023-08-10: 10 mg via ORAL
  Filled 2023-08-10: qty 1

## 2023-08-10 NOTE — ED Notes (Signed)
 Admitting provider at bedside.

## 2023-08-10 NOTE — Hospital Course (Signed)
 Charles Krueger is a 68 y.o. male with a history of CAD, HFrecEF, COPD, diabetes mellitus type 2, GERD, hypertension, OSA, pulmonary sarcoidosis, pulmonary hypertension, diverticulosis, CKD stage IIIb, cirrhosis.  Patient presented secondary to an abnormally low hemoglobin by her outpatient physician. Patient found to have anemia of unclear etiology. He was given 2 units of PRBC via transfusion. No evidence of acute bleeding and no symptoms. Patient recommended to discharge and to follow-up with outpatient physicians for ongoing workup.

## 2023-08-10 NOTE — Discharge Summary (Signed)
 Physician Discharge Summary   Patient: Charles Krueger MRN: [id number] DOB: May 15, 1956  Admit date:     08/09/2023  Discharge date: 08/10/23  Discharge Physician: Jacquelin Hawking, MD   PCP: [facility]   Recommendations at discharge:  PCP follow-up Repeat CBC and BMP in 3-5 days GI follow-up  Discharge Diagnoses: Principal Problem:   Acute on chronic anemia Active Problems:   COPD (chronic obstructive pulmonary disease) (HCC)   OSA (obstructive sleep apnea)   Diabetes mellitus (HCC)   CAD (coronary artery disease)   Stage 3b chronic kidney disease (HCC)   Hypercalcemia  Resolved Problems:   * No resolved hospital problems. *  Hospital Course: Charles Krueger is a 68 y.o. male with a history of CAD, HFrecEF, COPD, diabetes mellitus type 2, GERD, hypertension, OSA, pulmonary sarcoidosis, pulmonary hypertension, diverticulosis, CKD stage IIIb, cirrhosis.  Patient presented secondary to an abnormally low hemoglobin by her outpatient physician. Patient found to have anemia of unclear etiology. He was given 2 units of PRBC via transfusion. No evidence of acute bleeding and no symptoms. Patient recommended to discharge and to follow-up with outpatient physicians for ongoing workup.  Assessment and Plan:  Acute on chronic anemia Microcytic anemia Anemia panel significant for evidence of iron deficiency anemia. Hemoglobin of 6.4 g/dL on admission. FOBT negative. Normal bilirubin. No evidence of bleeding per history. Patient received 2 units of PRBC with post-transfusion hemoglobin of 8.6 g/dL. Continue outpatient iron and referral placed for IV iron. Recommend repeat CBC as an outpatient.  Hypercalcemia Mildly elevated at 10.4 (upper limit of normal is 10.3). Normal vitamin D. PTH pending. Asymptomatic. Recommend outpatient follow-up and repeat metabolic panel.  CAD Noted. Stable. Continue Imdur and Lipitor.  COPD Noted. Continue Stiolto Respimat  Pulmonary sarcoidosis Noted.    Controlled diabetes mellitus type 2 Continue Jardiance  Primary hypertension Continue Imdur, Entresto, hydralazine, Coreg and spironolactone.  OSA Noted. Continue CPAP.  CKD stage IIIb Stable.  Hyperlipidemia Continue Lipitor.  Chronic HFrecEF Continue home Entresto, spironolactone and Coreg   Consultants: None Procedures performed: None  Disposition: Home Diet recommendation: Carb modified diet   DISCHARGE MEDICATION: Allergies as of 08/10/2023       Reactions   Zestril [lisinopril] Other (See Comments)   Acute renal failure        Medication List     TAKE these medications    albuterol 108 (90 Base) MCG/ACT inhaler Commonly known as: VENTOLIN HFA Inhale 2 puffs into the lungs every 6 (six) hours as needed for wheezing or shortness of breath.   atorvastatin 40 MG tablet Commonly known as: LIPITOR Take 1 tablet (40 mg total) by mouth daily.   carvedilol 25 MG tablet Commonly known as: COREG Take 25 mg by mouth 2 (two) times daily with a meal.   cetirizine 10 MG tablet Commonly known as: ZYRTEC Take 10 mg by mouth daily as needed for allergies.   cholecalciferol 25 MCG (1000 UNIT) tablet Commonly known as: VITAMIN D3 Take 1,000 Units by mouth daily.   empagliflozin 10 MG Tabs tablet Commonly known as: JARDIANCE Take 1 tablet (10 mg total) by mouth daily.   Entresto 97-103 MG Generic drug: sacubitril-valsartan Take 1 tablet by mouth 2 times daily.   fluticasone 50 MCG/ACT nasal spray Commonly known as: FLONASE Place 1 spray into both nostrils 2 (two) times daily.   hydrALAZINE 50 MG tablet Commonly known as: APRESOLINE Take 1.5 tablets (75 mg total) by mouth 3 (three) times daily. What changed: how much to  take   Iron 325 (65 Fe) MG Tabs Take 1 tablet (325 mg total) by mouth daily with breakfast.   isosorbide mononitrate 30 MG 24 hr tablet Commonly known as: IMDUR Take 1 tablet (30 mg total) by mouth daily.   ketotifen 0.025 %  ophthalmic solution Commonly known as: ZADITOR Place 1 drop into both eyes 2 (two) times daily as needed (itchy eyes).   montelukast 10 MG tablet Commonly known as: SINGULAIR Take 10 mg by mouth daily.   multivitamin with minerals Tabs tablet Take 1 tablet by mouth daily.   spironolactone 25 MG tablet Commonly known as: ALDACTONE Take 1 tablet (25 mg total) by mouth daily.   Stiolto Respimat 2.5-2.5 MCG/ACT Aers Generic drug: Tiotropium Bromide-Olodaterol Inhale 2 puffs into the lungs in the morning.        Follow-up Information     [facility] Schedule an appointment as soon as possible for a visit in 3 day(s).   Why: For hospital follow-up, Repeat BMP and CBC Contact information: [address] [facility] [place] [address] [phone]                Discharge Exam: BP 129/79   Pulse 71   Temp 97.9 F (36.6 C) (Oral)   Resp (!) 21   Ht 5\' 9"  (1.753 m)   Wt 79.4 kg   SpO2 93%   BMI 25.84 kg/m   General exam: Appears calm and comfortable Respiratory system: Clear to auscultation. Respiratory effort normal. Cardiovascular system: S1 & S2 heard, RRR. No murmurs. Gastrointestinal system: Abdomen is nondistended, soft and nontender. Normal bowel sounds heard. Central nervous system: Alert and oriented. No focal neurological deficits. Musculoskeletal: No edema. No calf tenderness Psychiatry: Judgement and insight appear normal. Mood & affect appropriate.   Condition at discharge: stable  The results of significant diagnostics from this hospitalization (including imaging, microbiology, ancillary and laboratory) are listed below for reference.   Imaging Studies: No results found.  Microbiology: Results for orders placed or performed during the hospital encounter of 04/18/22  Resp Panel by RT-PCR (Flu A&B, Covid)     Status: None   Collection Time: 04/19/22  2:21 PM   Specimen: Nasal Swab  Result Value Ref Range Status   SARS  Coronavirus 2 by RT PCR NEGATIVE NEGATIVE Final    Comment: (NOTE) SARS-CoV-2 target nucleic acids are NOT DETECTED.  The SARS-CoV-2 RNA is generally detectable in upper respiratory specimens during the acute phase of infection. The lowest concentration of SARS-CoV-2 viral copies this assay can detect is 138 copies/mL. A negative result does not preclude SARS-Cov-2 infection and should not be used as the sole basis for treatment or other patient management decisions. A negative result may occur with  improper specimen collection/handling, submission of specimen other than nasopharyngeal swab, presence of viral mutation(s) within the areas targeted by this assay, and inadequate number of viral copies(<138 copies/mL). A negative result must be combined with clinical observations, patient history, and epidemiological information. The expected result is Negative.  Fact Sheet for Patients:  BloggerCourse.com  Fact Sheet for Healthcare Providers:  SeriousBroker.it  This test is no t yet approved or cleared by the [place] FDA and  has been authorized for detection and/or diagnosis of SARS-CoV-2 by FDA under an Emergency Use Authorization (EUA). This EUA will remain  in effect (meaning this test can be used) for the duration of the COVID-19 declaration under Section 564(b)(1) of the Act, 21 U.S.C.section 360bbb-3(b)(1), unless the authorization is  terminated  or revoked sooner.       Influenza A by PCR NEGATIVE NEGATIVE Final   Influenza B by PCR NEGATIVE NEGATIVE Final    Comment: (NOTE) The Xpert Xpress SARS-CoV-2/FLU/RSV plus assay is intended as an aid in the diagnosis of influenza from Nasopharyngeal swab specimens and should not be used as a sole basis for treatment. Nasal washings and aspirates are unacceptable for Xpert Xpress SARS-CoV-2/FLU/RSV testing.  Fact Sheet for  Patients: BloggerCourse.com  Fact Sheet for Healthcare Providers: SeriousBroker.it  This test is not yet approved or cleared by the [place] FDA and has been authorized for detection and/or diagnosis of SARS-CoV-2 by FDA under an Emergency Use Authorization (EUA). This EUA will remain in effect (meaning this test can be used) for the duration of the COVID-19 declaration under Section 564(b)(1) of the Act, 21 U.S.C. section 360bbb-3(b)(1), unless the authorization is terminated or revoked.  Performed at [facility], [address], [place], [place] [address]   Culture, blood (routine x 2)     Status: None   Collection Time: 04/19/22  3:05 PM   Specimen: BLOOD  Result Value Ref Range Status   Specimen Description BLOOD RIGHT ANTECUBITAL  Final   Special Requests   Final    BOTTLES DRAWN AEROBIC AND ANAEROBIC Blood Culture results may not be optimal due to an inadequate volume of blood received in culture bottles   Culture   Final    NO GROWTH 5 DAYS Performed at [facility], [address], [place], [place] [address]    Report Status 04/24/2022 FINAL  Final  Culture, blood (routine x 2)     Status: None   Collection Time: 04/19/22  7:40 PM   Specimen: BLOOD  Result Value Ref Range Status   Specimen Description BLOOD RIGHT ANTECUBITAL  Final   Special Requests   Final    BOTTLES DRAWN AEROBIC AND ANAEROBIC Blood Culture adequate volume   Culture   Final    NO GROWTH 5 DAYS Performed at [facility], [address] [place], [place] [address]    Report Status 04/24/2022 FINAL  Final    Labs: CBC: Recent Labs  Lab 08/09/23 1553 08/10/23 0742  WBC 7.0  --   NEUTROABS 3.9  --   HGB 6.4* 8.6*  HCT 23.2* 29.6*  MCV 77.9*  --   PLT 259  --    Basic Metabolic Panel: Recent Labs  Lab 08/09/23 1553  NA 140  K 5.0  CL 112*  CO2 20*  GLUCOSE 103*  BUN 27*  CREATININE 1.94*  CALCIUM 10.4*    Liver Function Tests: Recent Labs  Lab 08/09/23 1553  AST 15  ALT 16  ALKPHOS 129*  BILITOT 0.6  PROT 6.7  ALBUMIN 3.2*    Discharge time spent: 35 minutes.  Signed: Jacquelin Hawking, MD [facility] 08/10/2023

## 2023-08-10 NOTE — Discharge Instructions (Signed)
 Charles Krueger,  You are in the hospital because your doctor sent you over for low blood count.  This appears to be related to low iron levels.  It looks like you are already on iron: Please continue this on discharge.  While you are here you received a blood transfusion with significant improvement in your blood counts.  Does not appear that you are bleeding at this time.  Please follow-up with your primary gastroenterologist as an outpatient.  Please also follow-up with your primary care doctor to get repeat lab work in about 3 to 5 days.  I have placed a referral for you to get IV iron and an infusion clinic to help boost your levels up quicker.

## 2023-08-10 NOTE — H&P (Signed)
 History and Physical    Charles Krueger ZOX:[id number] DOB: 19-Oct-1955 DOA: 08/09/2023  [facility]P: [facility]  Patient coming from: Home  Chief Complaint: Anemia  HPI: Charles Krueger is a 68 y.o. male with medical history significant of CAD/previous MI, CHF, COPD, type 2 diabetes, GERD, hypertension, OSA on CPAP, pulmonary sarcoidosis, pulmonary hypertension, diverticulosis and ulcer in the distal ascending colon with history of lower GI bleed in August 2024, CKD stage IIIb, cirrhosis sent to the ED by his endocrinologist due to low hemoglobin of 6.7 on outpatient labs.  In the ED, patient remained hemodynamically stable.  Labs notable for hemoglobin 6.4 (baseline 8-9 range), MCV 77.9, creatinine 1.9 (stable), bicarb 20 (improved), calcium 10.4, albumin 3.2. Brown stool on rectal exam and Hemoccult negative. 2 units PRBCs ordered.  [facility] called to admit.  Patient states he had outpatient labs done by his endocrinologist yesterday and was told that his hemoglobin was low at 6.7 so he was sent to the ED to be evaluated.  He denies hematemesis, hematochezia, melena, or hematuria.  Reports history of nosebleed over 2 months ago and no further episodes since then.  Denies any other obvious bleeding.  Denies lightheadedness/dizziness, fatigue, chest pain, shortness of breath, or abdominal pain.  He reports history of iron deficiency.  Review of Systems:  Review of Systems  All other systems reviewed and are negative.   Past Medical History:  Diagnosis Date   COPD (chronic obstructive pulmonary disease) (HCC)    Diabetes mellitus without complication (HCC)    GERD (gastroesophageal reflux disease)    HTN (hypertension)    Myocardial infarction (HCC)    silent Mi several years ago   OSA on CPAP    Pneumonia    pt reports was mentioned in 12/23 at [facility] visit   Sarcoidosis     Past Surgical History:  Procedure Laterality Date   COLONOSCOPY N/A 02/12/2023   Procedure: COLONOSCOPY;   Surgeon: Sherrilyn Rist, MD;  Location: [facility] ENDOSCOPY;  Service: Gastroenterology;  Laterality: N/A;   HEMOSTASIS CLIP PLACEMENT  02/12/2023   Procedure: HEMOSTASIS CLIP PLACEMENT;  Surgeon: Sherrilyn Rist, MD;  Location: [facility] ENDOSCOPY;  Service: Gastroenterology;;   INGUINAL HERNIA REPAIR Right 09/03/2022   Procedure: OPEN RIGHT INGUINAL HERNIA REPAIR WITH MESH;  Surgeon: Andria Meuse, MD;  Location: [facility] ORS;  Service: General;  Laterality: Right;   RIGHT/LEFT HEART CATH AND CORONARY ANGIOGRAPHY N/A 12/14/2020   Procedure: RIGHT/LEFT HEART CATH AND CORONARY ANGIOGRAPHY;  Surgeon: Marykay Lex, MD;  Location: [facility] INVASIVE CV LAB;  Service: Cardiovascular;  Laterality: N/A;     reports that he has been smoking cigars. He started smoking about 23 years ago. He has never used smokeless tobacco. He reports current alcohol use of about 6.0 - 7.0 standard drinks of alcohol per week. He reports that he does not use drugs.  Allergies  Allergen Reactions   Zestril [Lisinopril] Other (See Comments)    Acute renal failure    Family History  Problem Relation Age of Onset   CAD Mother     Prior to Admission medications   Medication Sig Start Date End Date Taking? Authorizing Provider  albuterol (VENTOLIN HFA) 108 (90 Base) MCG/ACT inhaler Inhale 2 puffs into the lungs every 6 (six) hours as needed for wheezing or shortness of breath.   Yes [provider]  atorvastatin (LIPITOR) 40 MG tablet Take 1 tablet (40 mg total) by mouth daily. 01/09/21  Yes [name], NP  carvedilol (COREG) 25 MG tablet Take 25 mg by mouth 2 (two) times daily with a meal.   Yes [provider]  cetirizine (ZYRTEC) 10 MG tablet Take 10 mg by mouth daily as needed for allergies. 03/26/23  Yes [provider]  cholecalciferol (VITAMIN D3) 25 MCG (1000 UNIT) tablet Take 1,000 Units by mouth daily.   Yes [provider]  empagliflozin (JARDIANCE) 10 MG TABS tablet Take 1  tablet (10 mg total) by mouth daily. 12/23/20  Yes [name], MD  Ferrous Sulfate (IRON) 325 (65 Fe) MG TABS Take 1 tablet (325 mg total) by mouth daily with breakfast. 02/13/23  Yes [name], MD  fluticasone [facility]) 50 MCG/ACT nasal spray Place 1 spray into both nostrils 2 (two) times daily.   Yes [provider]  hydrALAZINE (APRESOLINE) 50 MG tablet Take 1.5 tablets (75 mg total) by mouth 3 (three) times daily. Patient taking differently: Take 50 mg by mouth 3 (three) times daily. 05/12/21  Yes [name], MD  isosorbide mononitrate (IMDUR) 30 MG 24 hr tablet Take 1 tablet (30 mg total) by mouth daily. 03/14/21  Yes [name], [name], [name]  ketotifen (ZADITOR) 0.025 % ophthalmic solution Place 1 drop into both eyes 2 (two) times daily as needed (itchy eyes).   Yes [provider]  montelukast (SINGULAIR) 10 MG tablet Take 10 mg by mouth daily.   Yes [provider]  Multiple Vitamin (MULTIVITAMIN WITH MINERALS) TABS tablet Take 1 tablet by mouth daily.   Yes [provider]  sacubitril-valsartan (ENTRESTO) 97-103 MG Take 1 tablet by mouth 2 times daily. 01/19/21  Yes [name], [name], [name]  spironolactone (ALDACTONE) 25 MG tablet Take 1 tablet (25 mg total) by mouth daily. 02/13/21  Yes [name], MD  Tiotropium Bromide-Olodaterol (STIOLTO RESPIMAT) 2.5-2.5 MCG/ACT AERS Inhale 2 puffs into the lungs in the morning.   Yes [provider]  allopurinol (ZYLOPRIM) 100 MG tablet Take 50 mg by mouth daily. Patient not taking: Reported on 08/10/2023    [provider]  aspirin 81 MG tablet Take 162 mg by mouth at bedtime. Patient not taking: Reported on 08/10/2023    [provider]  nicotine polacrilex (COMMIT) 2 MG lozenge Take 2 mg by mouth See admin instructions. Dissolve 1 lozenge in mouth every 2 hours as needed for nicotine. Patient not taking: Reported on 08/10/2023    [provider]    Physical  Exam: Vitals:   08/09/23 2258 08/09/23 2345 08/10/23 0045 08/10/23 0145  BP:  (!) 146/73 133/84 (!) 153/90  Pulse: 80 73 76 73  Resp: 20 18 20 20   Temp:  98.4 F (36.9 C) 98.2 F (36.8 C) 98.3 F (36.8 C)  TempSrc:  Oral  Oral  SpO2: 92% 98% 93% 98%  Weight:      Height:        Physical Exam Vitals reviewed.  Constitutional:      General: He is not in acute distress. HENT:     Head: Normocephalic and atraumatic.  Eyes:     Extraocular Movements: Extraocular movements intact.  Cardiovascular:     Rate and Rhythm: Normal rate and regular rhythm.     Pulses: Normal pulses.  Pulmonary:     Effort: Pulmonary effort is normal. No respiratory distress.     Breath sounds: Normal breath sounds. No wheezing or rales.  Abdominal:     General: Bowel sounds are normal.     Palpations:  Abdomen is soft.     Tenderness: There is no abdominal tenderness. There is no guarding.  Musculoskeletal:     Cervical back: Normal range of motion.     Right lower leg: No edema.     Left lower leg: No edema.  Skin:    General: Skin is warm and dry.  Neurological:     General: No focal deficit present.     Mental Status: He is alert and oriented to person, place, and time.     Labs on Admission: I have personally reviewed following labs and imaging studies  CBC: Recent Labs  Lab 08/09/23 1553  WBC 7.0  NEUTROABS 3.9  HGB 6.4*  HCT 23.2*  MCV 77.9*  PLT 259   Basic Metabolic Panel: Recent Labs  Lab 08/09/23 1553  NA 140  K 5.0  CL 112*  CO2 20*  GLUCOSE 103*  BUN 27*  CREATININE 1.94*  CALCIUM 10.4*   GFR: Estimated Creatinine Clearance: 37.5 mL/min (A) (by C-G formula based on SCr of 1.94 mg/dL (H)). Liver Function Tests: Recent Labs  Lab 08/09/23 1553  AST 15  ALT 16  ALKPHOS 129*  BILITOT 0.6  PROT 6.7  ALBUMIN 3.2*   No results for input(s): "LIPASE", "AMYLASE" in the last 168 hours. No results for input(s): "AMMONIA" in the last 168 hours. Coagulation  Profile: No results for input(s): "INR", "PROTIME" in the last 168 hours. Cardiac Enzymes: No results for input(s): "CKTOTAL", "CKMB", "CKMBINDEX", "TROPONINI" in the last 168 hours. BNP (last 3 results) No results for input(s): "PROBNP" in the last 8760 hours. HbA1C: No results for input(s): "HGBA1C" in the last 72 hours. CBG: No results for input(s): "GLUCAP" in the last 168 hours. Lipid Profile: No results for input(s): "CHOL", "HDL", "LDLCALC", "TRIG", "CHOLHDL", "LDLDIRECT" in the last 72 hours. Thyroid Function Tests: No results for input(s): "TSH", "T4TOTAL", "FREET4", "T3FREE", "THYROIDAB" in the last 72 hours. Anemia Panel: No results for input(s): "VITAMINB12", "FOLATE", "FERRITIN", "TIBC", "IRON", "RETICCTPCT" in the last 72 hours. Urine analysis:    Component Value Date/Time   COLORURINE YELLOW 06/10/2022 2010   APPEARANCEUR CLEAR 06/10/2022 2010   LABSPEC 1.018 06/10/2022 2010   PHURINE 6.0 06/10/2022 2010   GLUCOSEU 500 (A) 06/10/2022 2010   HGBUR NEGATIVE 06/10/2022 2010   BILIRUBINUR NEGATIVE 06/10/2022 2010   KETONESUR NEGATIVE 06/10/2022 2010   PROTEINUR 100 (A) 06/10/2022 2010   UROBILINOGEN 0.2 06/07/2014 2103   NITRITE NEGATIVE 06/10/2022 2010   LEUKOCYTESUR NEGATIVE 06/10/2022 2010    Radiological Exams on Admission: No results found.  Assessment and Plan  Acute on chronic anemia/microcytic anemia Hemoglobin 6.4, baseline in the 8-9 range.  Hemodynamically stable.  Patient reports having epistaxis over 2 months ago and no recurrence since then.  Brown stool on rectal exam and Hemoccult negative.  Not endorsing hematuria.  No other obvious source of bleeding.  He reports history of iron deficiency, and per pharmacy med rec, patient is not compliant with oral iron supplement.  Type and screen, 2 units PRBCs ordered in the ED.  Follow-up posttransfusion H&H and continue to transfuse if hemoglobin less than 8 given CAD.  Anemia panel ordered.  Mild  hypercalcemia Check ionized calcium, PTH, and vitamin D level.  CAD Patient is not endorsing any anginal symptoms.  COPD Stable, no signs of acute exacerbation.  Continue home medications.  Pulmonary sarcoidosis Outpatient pulmonology follow-up.  Type 2 diabetes? Diabetes listed in his past medical history but A1c 5.0 in October 2023 and  5.3 in March 2024.  Random blood glucose 103.  It seems he is seen by endocrinology.  Repeat A1c ordered.  Hypertension Slightly hypertensive.  Continue home medications.  Hyperlipidemia Continue Lipitor.  OSA Continue nightly CPAP.  CKD stage IIIb Creatinine stable.  Chronic heart failure Last echo done in November 2022 showing EF 55 to 60% which had improved from 30-35% previously.  No signs of volume overload at this time.  DVT prophylaxis: SCDs Code Status: Full Code (discussed with the patient) Family Communication: No family available at this time. Level of care: Telemetry bed Admission status: It is my clinical opinion that referral for OBSERVATION is reasonable and necessary in this patient based on the above information provided. The aforementioned taken together are felt to place the patient at high risk for further clinical deterioration. However, it is anticipated that the patient may be medically stable for discharge from the hospital within 24 to 48 hours.  John Giovanni MD [facility]  If 7PM-7AM, please contact night-coverage [email or web address]  08/10/2023, 2:10 AM

## 2023-08-11 LAB — TYPE AND SCREEN
ABO/RH(D): B POS
Antibody Screen: NEGATIVE
Unit division: 0
Unit division: 0

## 2023-08-11 LAB — CALCIUM, IONIZED: Calcium, Ionized, Serum: 5.5 mg/dL (ref 4.5–5.6)

## 2023-08-11 LAB — BPAM RBC
Blood Product Expiration Date: 202503122359
Blood Product Expiration Date: 202503122359
ISSUE DATE / TIME: 202502142227
ISSUE DATE / TIME: 202502150213
Unit Type and Rh: 7300
Unit Type and Rh: 7300

## 2023-08-12 LAB — PARATHYROID HORMONE, INTACT (NO CA): PTH: 211 pg/mL — ABNORMAL HIGH (ref 15–65)

## 2023-08-16 ENCOUNTER — Telehealth: Payer: Self-pay | Admitting: Pharmacy Technician

## 2023-08-16 NOTE — Telephone Encounter (Signed)
 Dr. Caleb Popp,  [facility] be able to treat [facility]. He must have a Community of Care approval from his MD at the [place] prior to being seen/treated at any outside facility.  Phone message has been left for patient to follow up with his MD at the [place].  [facility] phone: [phone] ext [phone]

## 2023-09-30 ENCOUNTER — Emergency Department (HOSPITAL_COMMUNITY)
Admission: EM | Admit: 2023-09-30 | Discharge: 2023-09-30 | Disposition: A | Discharge status: Home / Self Care | Attending: Emergency Medicine | Admitting: Emergency Medicine

## 2023-09-30 ENCOUNTER — Other Ambulatory Visit: Payer: Self-pay

## 2023-09-30 DIAGNOSIS — T380X5A Adverse effect of glucocorticoids and synthetic analogues, initial encounter: Secondary | ICD-10-CM | POA: Insufficient documentation

## 2023-09-30 DIAGNOSIS — I509 Heart failure, unspecified: Secondary | ICD-10-CM | POA: Diagnosis not present

## 2023-09-30 DIAGNOSIS — E0922 Drug or chemical induced diabetes mellitus with diabetic chronic kidney disease: Secondary | ICD-10-CM | POA: Insufficient documentation

## 2023-09-30 DIAGNOSIS — R739 Hyperglycemia, unspecified: Secondary | ICD-10-CM | POA: Diagnosis present

## 2023-09-30 DIAGNOSIS — N183 Chronic kidney disease, stage 3 unspecified: Secondary | ICD-10-CM | POA: Insufficient documentation

## 2023-09-30 DIAGNOSIS — Z7951 Long term (current) use of inhaled steroids: Secondary | ICD-10-CM | POA: Diagnosis not present

## 2023-09-30 DIAGNOSIS — E0965 Drug or chemical induced diabetes mellitus with hyperglycemia: Secondary | ICD-10-CM | POA: Insufficient documentation

## 2023-09-30 DIAGNOSIS — J449 Chronic obstructive pulmonary disease, unspecified: Secondary | ICD-10-CM | POA: Diagnosis not present

## 2023-09-30 DIAGNOSIS — E875 Hyperkalemia: Secondary | ICD-10-CM | POA: Diagnosis not present

## 2023-09-30 LAB — BASIC METABOLIC PANEL WITH GFR
Anion gap: 8 (ref 5–15)
BUN: 28 mg/dL — ABNORMAL HIGH (ref 8–23)
CO2: 22 mmol/L (ref 22–32)
Calcium: 10.2 mg/dL (ref 8.9–10.3)
Chloride: 105 mmol/L (ref 98–111)
Creatinine, Ser: 2.19 mg/dL — ABNORMAL HIGH (ref 0.61–1.24)
GFR, Estimated: 32 mL/min — ABNORMAL LOW (ref >60)
Glucose, Bld: 248 mg/dL — ABNORMAL HIGH (ref 70–99)
Potassium: 4.4 mmol/L (ref 3.5–5.1)
Sodium: 135 mmol/L (ref 135–145)

## 2023-09-30 LAB — COMPREHENSIVE METABOLIC PANEL WITH GFR
ALT: 21 U/L (ref 0–44)
AST: 22 U/L (ref 15–41)
Albumin: 3.7 g/dL (ref 3.5–5.0)
Alkaline Phosphatase: 154 U/L — ABNORMAL HIGH (ref 38–126)
Anion gap: 10 (ref 5–15)
BUN: 31 mg/dL — ABNORMAL HIGH (ref 8–23)
CO2: 22 mmol/L (ref 22–32)
Calcium: 10.4 mg/dL — ABNORMAL HIGH (ref 8.9–10.3)
Chloride: 98 mmol/L (ref 98–111)
Creatinine, Ser: 2.48 mg/dL — ABNORMAL HIGH (ref 0.61–1.24)
GFR, Estimated: 28 mL/min — ABNORMAL LOW (ref >60)
Glucose, Bld: 580 mg/dL (ref 70–99)
Potassium: 5.5 mmol/L — ABNORMAL HIGH (ref 3.5–5.1)
Sodium: 130 mmol/L — ABNORMAL LOW (ref 135–145)
Total Bilirubin: 0.7 mg/dL (ref 0.0–1.2)
Total Protein: 7.2 g/dL (ref 6.5–8.1)

## 2023-09-30 LAB — I-STAT CHEM 8, ED
BUN: 32 mg/dL — ABNORMAL HIGH (ref 8–23)
Calcium, Ion: 1.3 mmol/L (ref 1.15–1.40)
Chloride: 101 mmol/L (ref 98–111)
Creatinine, Ser: 2.5 mg/dL — ABNORMAL HIGH (ref 0.61–1.24)
Glucose, Bld: 545 mg/dL (ref 70–99)
HCT: 50 % (ref 39.0–52.0)
Hemoglobin: 17 g/dL (ref 13.0–17.0)
Potassium: 5.4 mmol/L — ABNORMAL HIGH (ref 3.5–5.1)
Sodium: 130 mmol/L — ABNORMAL LOW (ref 135–145)
TCO2: 20 mmol/L — ABNORMAL LOW (ref 22–32)

## 2023-09-30 LAB — CBC
HCT: 48.5 % (ref 39.0–52.0)
Hemoglobin: 15.1 g/dL (ref 13.0–17.0)
MCH: 27.4 pg (ref 26.0–34.0)
MCHC: 31.1 g/dL (ref 30.0–36.0)
MCV: 88 fL (ref 80.0–100.0)
Platelets: 162 10*3/uL (ref 150–400)
RBC: 5.51 MIL/uL (ref 4.22–5.81)
RDW: 22.8 % — ABNORMAL HIGH (ref 11.5–15.5)
WBC: 7.6 10*3/uL (ref 4.0–10.5)
nRBC: 0 % (ref 0.0–0.2)

## 2023-09-30 LAB — CBG MONITORING, ED
Glucose-Capillary: 588 mg/dL (ref 70–99)
Glucose-Capillary: 406 mg/dL — ABNORMAL HIGH (ref 70–99)
Glucose-Capillary: 261 mg/dL — ABNORMAL HIGH (ref 70–99)

## 2023-09-30 LAB — URINALYSIS, ROUTINE W REFLEX MICROSCOPIC
Bacteria, UA: NONE SEEN
Bilirubin Urine: NEGATIVE
Glucose, UA: >=500 mg/dL — AB
Hgb urine dipstick: NEGATIVE
Ketones, ur: NEGATIVE mg/dL
Leukocytes,Ua: NEGATIVE
Nitrite: NEGATIVE
Protein, ur: 100 mg/dL — AB
Specific Gravity, Urine: 1.024 (ref 1.005–1.030)
pH: 5 (ref 5.0–8.0)

## 2023-09-30 MED ORDER — SODIUM ZIRCONIUM CYCLOSILICATE 10 G PO PACK
10.0000 g | PACK | Freq: Once | ORAL | Status: AC
  Administered 2023-09-30: 10 g via ORAL
  Filled 2023-09-30: qty 1

## 2023-09-30 MED ORDER — SODIUM CHLORIDE 0.9 % IV BOLUS
1000.0000 mL | Freq: Once | INTRAVENOUS | Status: AC
  Administered 2023-09-30: 1000 mL via INTRAVENOUS

## 2023-09-30 MED ORDER — INSULIN ASPART 100 UNIT/ML IJ SOLN
10.0000 [IU] | Freq: Once | INTRAMUSCULAR | Status: AC
  Administered 2023-09-30: 10 [IU] via INTRAVENOUS

## 2023-09-30 NOTE — Discharge Instructions (Addendum)
 Thank you for letting us evaluate you today.  Your sugar was elevated at 588 however we have decreased it to 261 following fluids and some insulin.  This is likely secondary to your recent steroid use.  Please follow-up with primary care provider for further management.  Please make sure to take your Jardiance.

## 2023-09-30 NOTE — ED Triage Notes (Addendum)
 Pt states that he was at his PCP on Friday and got routine blood work performed which showed hyperglycemia. Pt states he was referred to ED then, but took his blood sugar and saw it downtrend. Pt states that today he checked it and his glucometer read "HIGH". He reports hx of T2DM and was taken off of all medications in the recent past. Pt recently prescribed Prednisone for sinus infection. Pt endorses polydipsia, polyuria. He also states that he's having LUQ abd pain.

## 2023-09-30 NOTE — ED Provider Triage Note (Signed)
 Emergency Medicine Provider Triage Evaluation Note  Charles Krueger , a 68 y.o. male  was evaluated in triage.  Pt complains of hyperglycemia.  Was on prednisone and ampicillin for "allergies" - was taking them Wed-Friday. Had blood work on Friday at [place] who recommended ED eval for hyperglycemia. Tests came back on Friday for CBG >900. On jardiance and is complaint with meds - took today  Patient's home meter showed "high" today so sought ED eval  Review of Systems  Positive: hyperglycemia Negative: Blurred vision, CP, SHOB  Physical Exam  BP (!) 122/91   Pulse 90   Temp 98.3 F (36.8 C)   Resp 16   SpO2 100%  Gen:   Awake, no distress   Resp:  Normal effort  MSK:   Moves extremities without difficulty  Other:  Neuro intact. Ambulates wo difficulty  Medical Decision Making  Medically screening exam initiated at 1:34 PM.  Appropriate orders placed.  Ole Quesnell was informed that the remainder of the evaluation will be completed by another provider, this initial triage assessment does not replace that evaluation, and the importance of remaining in the ED until their evaluation is complete.  Labs ordered   Judithann Sheen, [place] 09/30/23 1339

## 2023-09-30 NOTE — ED Provider Notes (Signed)
 [place] [facility] AT [facility] Provider Note   CSN: [id number] Arrival date & time: 09/30/23  1228     History {Add pertinent medical, surgical, social history, OB history to HPI:1} Chief Complaint  Patient presents with   Hyperglycemia    Charles Krueger is a 68 y.o. male.   Hyperglycemia      Home Medications Prior to Admission medications   Medication Sig Start Date End Date Taking? Authorizing Provider  albuterol (VENTOLIN HFA) 108 (90 Base) MCG/ACT inhaler Inhale 2 puffs into the lungs every 6 (six) hours as needed for wheezing or shortness of breath.    [provider]  atorvastatin (LIPITOR) 40 MG tablet Take 1 tablet (40 mg total) by mouth daily. 01/09/21   [name], NP  carvedilol (COREG) 25 MG tablet Take 25 mg by mouth 2 (two) times daily with a meal.    [provider]  cetirizine (ZYRTEC) 10 MG tablet Take 10 mg by mouth daily as needed for allergies. 03/26/23   [provider]  cholecalciferol (VITAMIN D3) 25 MCG (1000 UNIT) tablet Take 1,000 Units by mouth daily.    [provider]  empagliflozin (JARDIANCE) 10 MG TABS tablet Take 1 tablet (10 mg total) by mouth daily. 12/23/20   [name], MD  Ferrous Sulfate (IRON) 325 (65 Fe) MG TABS Take 1 tablet (325 mg total) by mouth daily with breakfast. 02/13/23   [name], MD  fluticasone [facility]) 50 MCG/ACT nasal spray Place 1 spray into both nostrils 2 (two) times daily.    [provider]  hydrALAZINE (APRESOLINE) 50 MG tablet Take 1.5 tablets (75 mg total) by mouth 3 (three) times daily. Patient taking differently: Take 50 mg by mouth 3 (three) times daily. 05/12/21   [name], MD  isosorbide mononitrate (IMDUR) 30 MG 24 hr tablet Take 1 tablet (30 mg total) by mouth daily. 03/14/21   [name], [name], [name]  ketotifen (ZADITOR) 0.025 % ophthalmic solution Place 1 drop into both eyes 2 (two) times daily as needed (itchy  eyes).    [provider]  montelukast (SINGULAIR) 10 MG tablet Take 10 mg by mouth daily.    [provider]  Multiple Vitamin (MULTIVITAMIN WITH MINERALS) TABS tablet Take 1 tablet by mouth daily.    [provider]  sacubitril-valsartan (ENTRESTO) 97-103 MG Take 1 tablet by mouth 2 times daily. 01/19/21   [name], [name]  spironolactone (ALDACTONE) 25 MG tablet Take 1 tablet (25 mg total) by mouth daily. 02/13/21   [name], MD  Tiotropium Bromide-Olodaterol (STIOLTO RESPIMAT) 2.5-2.5 MCG/ACT AERS Inhale 2 puffs into the lungs in the morning.    [provider]      Allergies    Zestril [lisinopril]    Review of Systems   Review of Systems  Physical Exam Updated Vital Signs BP 120/83 (BP Location: Right Arm)   Pulse 76   Temp 98.1 F (36.7 C) (Oral)   Resp (!) 21   Ht 5\' 9"  (1.753 m)   Wt 77.1 kg   SpO2 98%   BMI 25.10 kg/m  Physical Exam  ED Results / Procedures / Treatments   Labs (all labs ordered are listed, but only abnormal results are displayed) Labs Reviewed  CBC - Abnormal; Notable for the following components:      Result Value   RDW 22.8 (*)    All other components within normal limits  URINALYSIS, ROUTINE W  REFLEX MICROSCOPIC - Abnormal; Notable for the following components:   Glucose, UA >=500 (*)    Protein, ur 100 (*)    All other components within normal limits  COMPREHENSIVE METABOLIC PANEL WITH GFR - Abnormal; Notable for the following components:   Sodium 130 (*)    Potassium 5.5 (*)    Glucose, Bld 580 (*)    BUN 31 (*)    Creatinine, Ser 2.48 (*)    Calcium 10.4 (*)    Alkaline Phosphatase 154 (*)    GFR, Estimated 28 (*)    All other components within normal limits  BASIC METABOLIC PANEL WITH GFR - Abnormal; Notable for the following components:   Glucose, Bld 248 (*)    BUN 28 (*)    Creatinine, Ser 2.19 (*)    GFR, Estimated 32 (*)    All other components within normal limits  CBG  MONITORING, ED - Abnormal; Notable for the following components:   Glucose-Capillary 588 (*)    All other components within normal limits  CBG MONITORING, ED - Abnormal; Notable for the following components:   Glucose-Capillary 406 (*)    All other components within normal limits  I-STAT CHEM 8, ED - Abnormal; Notable for the following components:   Sodium 130 (*)    Potassium 5.4 (*)    BUN 32 (*)    Creatinine, Ser 2.50 (*)    Glucose, Bld 545 (*)    TCO2 20 (*)    All other components within normal limits  CBG MONITORING, ED - Abnormal; Notable for the following components:   Glucose-Capillary 261 (*)    All other components within normal limits  CBG MONITORING, ED    EKG None  Radiology No results found.  Procedures Procedures  {Document cardiac monitor, telemetry assessment procedure when appropriate:1}  Medications Ordered in ED Medications  insulin aspart (novoLOG) injection 10 Units (10 Units Intravenous Given 09/30/23 1637)  sodium chloride 0.9 % bolus 1,000 mL (0 mLs Intravenous Stopped 09/30/23 1738)  sodium zirconium cyclosilicate (LOKELMA) packet 10 g (10 g Oral Given 09/30/23 1637)    ED Course/ Medical Decision Making/ A&P   {   Click here for ABCD2, HEART and other calculatorsREFRESH Note before signing :1}                              Medical Decision Making Amount and/or Complexity of Data Reviewed Labs: ordered.  Risk Prescription drug management.   ***  {Document critical care time when appropriate:1} {Document review of labs and clinical decision tools ie heart score, Chads2Vasc2 etc:1}  {Document your independent review of radiology images, and any outside records:1} {Document your discussion with family members, caretakers, and with consultants:1} {Document social determinants of health affecting pt's care:1} {Document your decision making why or why not admission, treatments were needed:1} Final Clinical Impression(s) / ED Diagnoses Final  diagnoses:  None    Rx / DC Orders ED Discharge Orders     None

## 2023-09-30 NOTE — ED Notes (Signed)
 Pt. Reuts of cm 8 flagged  given to billy m.rn by at

## 2023-10-08 ENCOUNTER — Inpatient Hospital Stay (HOSPITAL_COMMUNITY)
Admission: EM | Admit: 2023-10-08 | Discharge: 2023-10-11 | DRG: 439 | Disposition: A | Discharge status: Home / Self Care | Attending: Family Medicine | Admitting: Family Medicine

## 2023-10-08 ENCOUNTER — Other Ambulatory Visit: Payer: Self-pay

## 2023-10-08 ENCOUNTER — Encounter (HOSPITAL_COMMUNITY): Payer: Self-pay | Admitting: Emergency Medicine

## 2023-10-08 DIAGNOSIS — I130 Hypertensive heart and chronic kidney disease with heart failure and stage 1 through stage 4 chronic kidney disease, or unspecified chronic kidney disease: Secondary | ICD-10-CM | POA: Diagnosis present

## 2023-10-08 DIAGNOSIS — K860 Alcohol-induced chronic pancreatitis: Secondary | ICD-10-CM | POA: Diagnosis present

## 2023-10-08 DIAGNOSIS — Z794 Long term (current) use of insulin: Secondary | ICD-10-CM

## 2023-10-08 DIAGNOSIS — R9389 Abnormal findings on diagnostic imaging of other specified body structures: Secondary | ICD-10-CM

## 2023-10-08 DIAGNOSIS — D860 Sarcoidosis of lung: Secondary | ICD-10-CM | POA: Diagnosis present

## 2023-10-08 DIAGNOSIS — R739 Hyperglycemia, unspecified: Secondary | ICD-10-CM

## 2023-10-08 DIAGNOSIS — I272 Pulmonary hypertension, unspecified: Secondary | ICD-10-CM | POA: Diagnosis present

## 2023-10-08 DIAGNOSIS — I2510 Atherosclerotic heart disease of native coronary artery without angina pectoris: Secondary | ICD-10-CM | POA: Diagnosis present

## 2023-10-08 DIAGNOSIS — K859 Acute pancreatitis without necrosis or infection, unspecified: Secondary | ICD-10-CM | POA: Diagnosis not present

## 2023-10-08 DIAGNOSIS — N179 Acute kidney failure, unspecified: Secondary | ICD-10-CM | POA: Diagnosis present

## 2023-10-08 DIAGNOSIS — Z860100 Personal history of colon polyps, unspecified: Secondary | ICD-10-CM

## 2023-10-08 DIAGNOSIS — K760 Fatty (change of) liver, not elsewhere classified: Secondary | ICD-10-CM | POA: Diagnosis present

## 2023-10-08 DIAGNOSIS — Z888 Allergy status to other drugs, medicaments and biological substances status: Secondary | ICD-10-CM

## 2023-10-08 DIAGNOSIS — Z8679 Personal history of other diseases of the circulatory system: Secondary | ICD-10-CM

## 2023-10-08 DIAGNOSIS — N1832 Chronic kidney disease, stage 3b: Secondary | ICD-10-CM | POA: Diagnosis present

## 2023-10-08 DIAGNOSIS — D638 Anemia in other chronic diseases classified elsewhere: Secondary | ICD-10-CM | POA: Diagnosis present

## 2023-10-08 DIAGNOSIS — Z7984 Long term (current) use of oral hypoglycemic drugs: Secondary | ICD-10-CM

## 2023-10-08 DIAGNOSIS — Z87891 Personal history of nicotine dependence: Secondary | ICD-10-CM

## 2023-10-08 DIAGNOSIS — I10 Essential (primary) hypertension: Secondary | ICD-10-CM | POA: Insufficient documentation

## 2023-10-08 DIAGNOSIS — K5790 Diverticulosis of intestine, part unspecified, without perforation or abscess without bleeding: Secondary | ICD-10-CM | POA: Insufficient documentation

## 2023-10-08 DIAGNOSIS — G4733 Obstructive sleep apnea (adult) (pediatric): Secondary | ICD-10-CM | POA: Diagnosis present

## 2023-10-08 DIAGNOSIS — E872 Acidosis, unspecified: Secondary | ICD-10-CM | POA: Diagnosis present

## 2023-10-08 DIAGNOSIS — J449 Chronic obstructive pulmonary disease, unspecified: Secondary | ICD-10-CM | POA: Diagnosis present

## 2023-10-08 DIAGNOSIS — D649 Anemia, unspecified: Secondary | ICD-10-CM | POA: Diagnosis present

## 2023-10-08 DIAGNOSIS — K8689 Other specified diseases of pancreas: Secondary | ICD-10-CM

## 2023-10-08 DIAGNOSIS — I252 Old myocardial infarction: Secondary | ICD-10-CM

## 2023-10-08 DIAGNOSIS — E119 Type 2 diabetes mellitus without complications: Secondary | ICD-10-CM

## 2023-10-08 DIAGNOSIS — I5032 Chronic diastolic (congestive) heart failure: Secondary | ICD-10-CM | POA: Diagnosis present

## 2023-10-08 DIAGNOSIS — E1122 Type 2 diabetes mellitus with diabetic chronic kidney disease: Secondary | ICD-10-CM | POA: Diagnosis present

## 2023-10-08 DIAGNOSIS — E1165 Type 2 diabetes mellitus with hyperglycemia: Secondary | ICD-10-CM | POA: Diagnosis present

## 2023-10-08 DIAGNOSIS — D509 Iron deficiency anemia, unspecified: Secondary | ICD-10-CM | POA: Diagnosis present

## 2023-10-08 DIAGNOSIS — Z79899 Other long term (current) drug therapy: Secondary | ICD-10-CM

## 2023-10-08 DIAGNOSIS — K429 Umbilical hernia without obstruction or gangrene: Secondary | ICD-10-CM | POA: Diagnosis present

## 2023-10-08 DIAGNOSIS — N189 Chronic kidney disease, unspecified: Secondary | ICD-10-CM

## 2023-10-08 DIAGNOSIS — Z8249 Family history of ischemic heart disease and other diseases of the circulatory system: Secondary | ICD-10-CM

## 2023-10-08 DIAGNOSIS — K219 Gastro-esophageal reflux disease without esophagitis: Secondary | ICD-10-CM | POA: Diagnosis present

## 2023-10-08 DIAGNOSIS — I071 Rheumatic tricuspid insufficiency: Secondary | ICD-10-CM | POA: Diagnosis present

## 2023-10-08 LAB — COMPREHENSIVE METABOLIC PANEL WITH GFR
ALT: 12 U/L (ref 0–44)
AST: 11 U/L — ABNORMAL LOW (ref 15–41)
Albumin: 3.5 g/dL (ref 3.5–5.0)
Alkaline Phosphatase: 156 U/L — ABNORMAL HIGH (ref 38–126)
Anion gap: 12 (ref 5–15)
BUN: 26 mg/dL — ABNORMAL HIGH (ref 8–23)
CO2: 19 mmol/L — ABNORMAL LOW (ref 22–32)
Calcium: 10.4 mg/dL — ABNORMAL HIGH (ref 8.9–10.3)
Chloride: 102 mmol/L (ref 98–111)
Creatinine, Ser: 2.35 mg/dL — ABNORMAL HIGH (ref 0.61–1.24)
GFR, Estimated: 30 mL/min — ABNORMAL LOW (ref >60)
Glucose, Bld: 518 mg/dL (ref 70–99)
Potassium: 4.5 mmol/L (ref 3.5–5.1)
Sodium: 133 mmol/L — ABNORMAL LOW (ref 135–145)
Total Bilirubin: 0.5 mg/dL (ref 0.0–1.2)
Total Protein: 6.9 g/dL (ref 6.5–8.1)

## 2023-10-08 LAB — CBC WITH DIFFERENTIAL/PLATELET
Abs Immature Granulocytes: 0.02 10*3/uL (ref 0.00–0.07)
Basophils Absolute: 0 10*3/uL (ref 0.0–0.1)
Basophils Relative: 1 %
Eosinophils Absolute: 0.1 10*3/uL (ref 0.0–0.5)
Eosinophils Relative: 2 %
HCT: 48.7 % (ref 39.0–52.0)
Hemoglobin: 15.6 g/dL (ref 13.0–17.0)
Immature Granulocytes: 0 %
Lymphocytes Relative: 22 %
Lymphs Abs: 1.4 10*3/uL (ref 0.7–4.0)
MCH: 27.9 pg (ref 26.0–34.0)
MCHC: 32 g/dL (ref 30.0–36.0)
MCV: 87 fL (ref 80.0–100.0)
Monocytes Absolute: 0.4 10*3/uL (ref 0.1–1.0)
Monocytes Relative: 7 %
Neutro Abs: 4.3 10*3/uL (ref 1.7–7.7)
Neutrophils Relative %: 68 %
Platelets: 156 10*3/uL (ref 150–400)
RBC: 5.6 MIL/uL (ref 4.22–5.81)
RDW: 21.7 % — ABNORMAL HIGH (ref 11.5–15.5)
WBC: 6.3 10*3/uL (ref 4.0–10.5)
nRBC: 0 % (ref 0.0–0.2)

## 2023-10-08 LAB — LIPASE, BLOOD: Lipase: 710 U/L — ABNORMAL HIGH (ref 11–51)

## 2023-10-08 NOTE — ED Triage Notes (Signed)
 Patient arrives ambulatory by POV with paperwork from [place] c/o hyperglycemia and pancreatitis. Patient had CT scan done at [facility] today. C/o left abdomen discomfort. Denies any N/V/D.

## 2023-10-08 NOTE — ED Provider Triage Note (Signed)
 Emergency Medicine Provider Triage Evaluation Note  Charles Krueger , a 68 y.o. male  was evaluated in triage.  Pt complains of abdominal pain. States same has been ongoing for months. Went to pcp through the [place] today and had a Ct that showed concerns for pancreatic neoplasm vs pancreatitis. Recommended MRCP for further evaluation. Presents for same. He has paperwork with CT read with him. Denies n/v/d. Last had pancreatitis back in 2008. Has not had alcohol in 1.5 years. Pain is left sided.   Review of Systems  Positive:  Negative:   Physical Exam  BP (!) 141/82 (BP Location: Right Arm)   Pulse 92   Temp 98.5 F (36.9 C)   Resp 18   Ht 5\' 9"  (1.753 m)   Wt 77.1 kg   SpO2 94%   BMI 25.10 kg/m  Gen:   Awake, no distress   Resp:  Normal effort  MSK:   Moves extremities without difficulty Other:    Medical Decision Making  Medically screening exam initiated at 3:55 PM.  Appropriate orders placed.  Charles Krueger was informed that the remainder of the evaluation will be completed by another provider, this initial triage assessment does not replace that evaluation, and the importance of remaining in the ED until their evaluation is complete.     Sherra Dk, PA-C 10/08/23 1558

## 2023-10-09 ENCOUNTER — Inpatient Hospital Stay (HOSPITAL_COMMUNITY)

## 2023-10-09 ENCOUNTER — Encounter (HOSPITAL_COMMUNITY): Payer: Self-pay | Admitting: Internal Medicine

## 2023-10-09 DIAGNOSIS — I10 Essential (primary) hypertension: Secondary | ICD-10-CM

## 2023-10-09 DIAGNOSIS — Z794 Long term (current) use of insulin: Secondary | ICD-10-CM | POA: Diagnosis not present

## 2023-10-09 DIAGNOSIS — J449 Chronic obstructive pulmonary disease, unspecified: Secondary | ICD-10-CM | POA: Diagnosis present

## 2023-10-09 DIAGNOSIS — I252 Old myocardial infarction: Secondary | ICD-10-CM | POA: Diagnosis not present

## 2023-10-09 DIAGNOSIS — K8500 Idiopathic acute pancreatitis without necrosis or infection: Secondary | ICD-10-CM | POA: Diagnosis not present

## 2023-10-09 DIAGNOSIS — R9389 Abnormal findings on diagnostic imaging of other specified body structures: Secondary | ICD-10-CM

## 2023-10-09 DIAGNOSIS — E1165 Type 2 diabetes mellitus with hyperglycemia: Secondary | ICD-10-CM | POA: Diagnosis present

## 2023-10-09 DIAGNOSIS — K860 Alcohol-induced chronic pancreatitis: Secondary | ICD-10-CM | POA: Diagnosis present

## 2023-10-09 DIAGNOSIS — I2510 Atherosclerotic heart disease of native coronary artery without angina pectoris: Secondary | ICD-10-CM | POA: Diagnosis present

## 2023-10-09 DIAGNOSIS — K219 Gastro-esophageal reflux disease without esophagitis: Secondary | ICD-10-CM

## 2023-10-09 DIAGNOSIS — K760 Fatty (change of) liver, not elsewhere classified: Secondary | ICD-10-CM | POA: Diagnosis present

## 2023-10-09 DIAGNOSIS — D860 Sarcoidosis of lung: Secondary | ICD-10-CM | POA: Diagnosis present

## 2023-10-09 DIAGNOSIS — K859 Acute pancreatitis without necrosis or infection, unspecified: Secondary | ICD-10-CM | POA: Diagnosis present

## 2023-10-09 DIAGNOSIS — N179 Acute kidney failure, unspecified: Secondary | ICD-10-CM | POA: Diagnosis present

## 2023-10-09 DIAGNOSIS — J410 Simple chronic bronchitis: Secondary | ICD-10-CM

## 2023-10-09 DIAGNOSIS — K8689 Other specified diseases of pancreas: Secondary | ICD-10-CM | POA: Diagnosis not present

## 2023-10-09 DIAGNOSIS — D649 Anemia, unspecified: Secondary | ICD-10-CM

## 2023-10-09 DIAGNOSIS — I272 Pulmonary hypertension, unspecified: Secondary | ICD-10-CM | POA: Diagnosis present

## 2023-10-09 DIAGNOSIS — I5032 Chronic diastolic (congestive) heart failure: Secondary | ICD-10-CM | POA: Diagnosis present

## 2023-10-09 DIAGNOSIS — K429 Umbilical hernia without obstruction or gangrene: Secondary | ICD-10-CM | POA: Diagnosis present

## 2023-10-09 DIAGNOSIS — E119 Type 2 diabetes mellitus without complications: Secondary | ICD-10-CM

## 2023-10-09 DIAGNOSIS — G4733 Obstructive sleep apnea (adult) (pediatric): Secondary | ICD-10-CM | POA: Diagnosis present

## 2023-10-09 DIAGNOSIS — I071 Rheumatic tricuspid insufficiency: Secondary | ICD-10-CM | POA: Diagnosis present

## 2023-10-09 DIAGNOSIS — K858 Other acute pancreatitis without necrosis or infection: Secondary | ICD-10-CM

## 2023-10-09 DIAGNOSIS — N1832 Chronic kidney disease, stage 3b: Secondary | ICD-10-CM | POA: Diagnosis present

## 2023-10-09 DIAGNOSIS — D638 Anemia in other chronic diseases classified elsewhere: Secondary | ICD-10-CM | POA: Diagnosis present

## 2023-10-09 DIAGNOSIS — N189 Chronic kidney disease, unspecified: Secondary | ICD-10-CM | POA: Diagnosis not present

## 2023-10-09 DIAGNOSIS — E1122 Type 2 diabetes mellitus with diabetic chronic kidney disease: Secondary | ICD-10-CM | POA: Diagnosis present

## 2023-10-09 DIAGNOSIS — R739 Hyperglycemia, unspecified: Secondary | ICD-10-CM | POA: Diagnosis present

## 2023-10-09 DIAGNOSIS — Z8679 Personal history of other diseases of the circulatory system: Secondary | ICD-10-CM

## 2023-10-09 DIAGNOSIS — K5790 Diverticulosis of intestine, part unspecified, without perforation or abscess without bleeding: Secondary | ICD-10-CM

## 2023-10-09 DIAGNOSIS — E872 Acidosis, unspecified: Secondary | ICD-10-CM | POA: Diagnosis present

## 2023-10-09 DIAGNOSIS — I130 Hypertensive heart and chronic kidney disease with heart failure and stage 1 through stage 4 chronic kidney disease, or unspecified chronic kidney disease: Secondary | ICD-10-CM | POA: Diagnosis present

## 2023-10-09 DIAGNOSIS — D509 Iron deficiency anemia, unspecified: Secondary | ICD-10-CM | POA: Diagnosis present

## 2023-10-09 DIAGNOSIS — Z7984 Long term (current) use of oral hypoglycemic drugs: Secondary | ICD-10-CM | POA: Diagnosis not present

## 2023-10-09 DIAGNOSIS — Z87891 Personal history of nicotine dependence: Secondary | ICD-10-CM | POA: Diagnosis not present

## 2023-10-09 LAB — URINALYSIS, ROUTINE W REFLEX MICROSCOPIC
Bacteria, UA: NONE SEEN
Bilirubin Urine: NEGATIVE
Glucose, UA: >=500 mg/dL — AB
Hgb urine dipstick: NEGATIVE
Ketones, ur: 5 mg/dL — AB
Leukocytes,Ua: NEGATIVE
Nitrite: NEGATIVE
Protein, ur: 100 mg/dL — AB
Specific Gravity, Urine: 1.014 (ref 1.005–1.030)
pH: 6 (ref 5.0–8.0)

## 2023-10-09 LAB — CBC
HCT: 43.9 % (ref 39.0–52.0)
Hemoglobin: 14.2 g/dL (ref 13.0–17.0)
MCH: 28.1 pg (ref 26.0–34.0)
MCHC: 32.3 g/dL (ref 30.0–36.0)
MCV: 86.8 fL (ref 80.0–100.0)
Platelets: 132 10*3/uL — ABNORMAL LOW (ref 150–400)
RBC: 5.06 MIL/uL (ref 4.22–5.81)
RDW: 21.5 % — ABNORMAL HIGH (ref 11.5–15.5)
WBC: 6.3 10*3/uL (ref 4.0–10.5)
nRBC: 0 % (ref 0.0–0.2)

## 2023-10-09 LAB — COMPREHENSIVE METABOLIC PANEL WITH GFR
ALT: 11 U/L (ref 0–44)
AST: 10 U/L — ABNORMAL LOW (ref 15–41)
Albumin: 2.8 g/dL — ABNORMAL LOW (ref 3.5–5.0)
Alkaline Phosphatase: 129 U/L — ABNORMAL HIGH (ref 38–126)
Anion gap: 9 (ref 5–15)
BUN: 23 mg/dL (ref 8–23)
CO2: 21 mmol/L — ABNORMAL LOW (ref 22–32)
Calcium: 10 mg/dL (ref 8.9–10.3)
Chloride: 108 mmol/L (ref 98–111)
Creatinine, Ser: 1.89 mg/dL — ABNORMAL HIGH (ref 0.61–1.24)
GFR, Estimated: 39 mL/min — ABNORMAL LOW (ref >60)
Glucose, Bld: 213 mg/dL — ABNORMAL HIGH (ref 70–99)
Potassium: 4 mmol/L (ref 3.5–5.1)
Sodium: 138 mmol/L (ref 135–145)
Total Bilirubin: 0.3 mg/dL (ref 0.0–1.2)
Total Protein: 5.7 g/dL — ABNORMAL LOW (ref 6.5–8.1)

## 2023-10-09 LAB — CBG MONITORING, ED
Glucose-Capillary: 333 mg/dL — ABNORMAL HIGH (ref 70–99)
Glucose-Capillary: 288 mg/dL — ABNORMAL HIGH (ref 70–99)
Glucose-Capillary: 191 mg/dL — ABNORMAL HIGH (ref 70–99)
Glucose-Capillary: 157 mg/dL — ABNORMAL HIGH (ref 70–99)
Glucose-Capillary: 175 mg/dL — ABNORMAL HIGH (ref 70–99)

## 2023-10-09 LAB — I-STAT VENOUS BLOOD GAS, ED
Acid-base deficit: 6 mmol/L — ABNORMAL HIGH (ref 0.0–2.0)
Bicarbonate: 21 mmol/L (ref 20.0–28.0)
Calcium, Ion: 1.32 mmol/L (ref 1.15–1.40)
HCT: 45 % (ref 39.0–52.0)
Hemoglobin: 15.3 g/dL (ref 13.0–17.0)
O2 Saturation: 32 %
Potassium: 4.3 mmol/L (ref 3.5–5.1)
Sodium: 139 mmol/L (ref 135–145)
TCO2: 22 mmol/L (ref 22–32)
pCO2, Ven: 44.1 mmHg (ref 44–60)
pH, Ven: 7.285 (ref 7.25–7.43)
pO2, Ven: 22 mmHg — CL (ref 32–45)

## 2023-10-09 LAB — OSMOLALITY: Osmolality: 311 mosm/kg — ABNORMAL HIGH (ref 275–295)

## 2023-10-09 LAB — GLUCOSE, CAPILLARY: Glucose-Capillary: 139 mg/dL — ABNORMAL HIGH (ref 70–99)

## 2023-10-09 MED ORDER — SODIUM CHLORIDE 0.9 % IV BOLUS
1000.0000 mL | Freq: Once | INTRAVENOUS | Status: AC
  Administered 2023-10-09: 1000 mL via INTRAVENOUS

## 2023-10-09 MED ORDER — HYDRALAZINE HCL 50 MG PO TABS
50.0000 mg | ORAL_TABLET | Freq: Three times a day (TID) | ORAL | Status: DC
  Administered 2023-10-09 – 2023-10-11 (×6): 50 mg via ORAL
  Filled 2023-10-09 (×4): qty 1
  Filled 2023-10-09: qty 2
  Filled 2023-10-09: qty 1

## 2023-10-09 MED ORDER — CARVEDILOL 12.5 MG PO TABS: 25.0000 mg | ORAL_TABLET | Freq: Two times a day (BID) | ORAL | Status: DC

## 2023-10-09 MED ORDER — ARFORMOTEROL TARTRATE 15 MCG/2ML IN NEBU
15.0000 ug | INHALATION_SOLUTION | Freq: Two times a day (BID) | RESPIRATORY_TRACT | Status: DC
  Administered 2023-10-09 – 2023-10-11 (×4): 15 ug via RESPIRATORY_TRACT
  Filled 2023-10-09 (×3): qty 2

## 2023-10-09 MED ORDER — ALBUTEROL SULFATE (2.5 MG/3ML) 0.083% IN NEBU: 2.5000 mg | INHALATION_SOLUTION | Freq: Four times a day (QID) | RESPIRATORY_TRACT | Status: DC | PRN

## 2023-10-09 MED ORDER — SODIUM CHLORIDE 0.9% FLUSH
3.0000 mL | Freq: Two times a day (BID) | INTRAVENOUS | Status: DC
  Administered 2023-10-09 – 2023-10-11 (×4): 3 mL via INTRAVENOUS

## 2023-10-09 MED ORDER — INSULIN ASPART 100 UNIT/ML IJ SOLN: 5.0000 [IU] | INTRAMUSCULAR | Status: DC

## 2023-10-09 MED ORDER — LACTATED RINGERS IV BOLUS
1000.0000 mL | INTRAVENOUS | Status: AC
  Administered 2023-10-09: 1000 mL via INTRAVENOUS

## 2023-10-09 MED ORDER — ATORVASTATIN CALCIUM 40 MG PO TABS
40.0000 mg | ORAL_TABLET | Freq: Every day | ORAL | Status: DC
  Administered 2023-10-09 – 2023-10-11 (×3): 40 mg via ORAL
  Filled 2023-10-09 (×3): qty 1

## 2023-10-09 MED ORDER — LACTATED RINGERS IV SOLN: INTRAVENOUS | Status: AC

## 2023-10-09 MED ORDER — FERROUS SULFATE 325 (65 FE) MG PO TABS
325.0000 mg | ORAL_TABLET | Freq: Every day | ORAL | Status: DC
  Administered 2023-10-09 – 2023-10-11 (×3): 325 mg via ORAL
  Filled 2023-10-09 (×3): qty 1

## 2023-10-09 MED ORDER — ACETAMINOPHEN 650 MG RE SUPP: 650.0000 mg | Freq: Four times a day (QID) | RECTAL | Status: DC | PRN

## 2023-10-09 MED ORDER — ONDANSETRON HCL 4 MG/2ML IJ SOLN: 4.0000 mg | Freq: Four times a day (QID) | INTRAMUSCULAR | Status: DC | PRN

## 2023-10-09 MED ORDER — HEPARIN SODIUM (PORCINE) 5000 UNIT/ML IJ SOLN
5000.0000 [IU] | Freq: Three times a day (TID) | INTRAMUSCULAR | Status: DC
  Administered 2023-10-09 – 2023-10-11 (×6): 5000 [IU] via SUBCUTANEOUS
  Filled 2023-10-09 (×6): qty 1

## 2023-10-09 MED ORDER — ACETAMINOPHEN 325 MG PO TABS
650.0000 mg | ORAL_TABLET | Freq: Four times a day (QID) | ORAL | Status: DC | PRN
  Administered 2023-10-11: 650 mg via ORAL
  Filled 2023-10-09: qty 2

## 2023-10-09 MED ORDER — SODIUM CHLORIDE 0.9% FLUSH: 3.0000 mL | INTRAVENOUS | Status: DC | PRN

## 2023-10-09 MED ORDER — CARVEDILOL 25 MG PO TABS
25.0000 mg | ORAL_TABLET | Freq: Two times a day (BID) | ORAL | Status: DC
  Administered 2023-10-09 – 2023-10-11 (×5): 25 mg via ORAL
  Filled 2023-10-09 (×2): qty 1
  Filled 2023-10-09: qty 2
  Filled 2023-10-09: qty 1
  Filled 2023-10-09: qty 2

## 2023-10-09 MED ORDER — UMECLIDINIUM BROMIDE 62.5 MCG/ACT IN AEPB
1.0000 | INHALATION_SPRAY | Freq: Every day | RESPIRATORY_TRACT | Status: DC
  Administered 2023-10-10 – 2023-10-11 (×2): 1 via RESPIRATORY_TRACT
  Filled 2023-10-09: qty 7

## 2023-10-09 MED ORDER — INSULIN ASPART 100 UNIT/ML IJ SOLN: 0.0000 [IU] | Freq: Every day | INTRAMUSCULAR | Status: DC

## 2023-10-09 MED ORDER — ISOSORBIDE MONONITRATE ER 30 MG PO TB24
30.0000 mg | ORAL_TABLET | Freq: Every day | ORAL | Status: DC
  Administered 2023-10-09 – 2023-10-11 (×3): 30 mg via ORAL
  Filled 2023-10-09 (×3): qty 1

## 2023-10-09 MED ORDER — MONTELUKAST SODIUM 10 MG PO TABS
10.0000 mg | ORAL_TABLET | Freq: Every day | ORAL | Status: DC
  Administered 2023-10-09 – 2023-10-11 (×3): 10 mg via ORAL
  Filled 2023-10-09 (×3): qty 1

## 2023-10-09 MED ORDER — HYDRALAZINE HCL 25 MG PO TABS
50.0000 mg | ORAL_TABLET | Freq: Once | ORAL | Status: AC
Start: 2023-10-09 — End: 2023-10-09
  Administered 2023-10-09: 50 mg via ORAL
  Filled 2023-10-09: qty 2

## 2023-10-09 MED ORDER — HYDRALAZINE HCL 25 MG PO TABS: 50.0000 mg | ORAL_TABLET | Freq: Three times a day (TID) | ORAL | Status: DC

## 2023-10-09 MED ORDER — INSULIN ASPART 100 UNIT/ML IJ SOLN
0.0000 [IU] | INTRAMUSCULAR | Status: DC
  Administered 2023-10-09: 3 [IU] via SUBCUTANEOUS
  Administered 2023-10-09 – 2023-10-10 (×4): 1 [IU] via SUBCUTANEOUS
  Administered 2023-10-10: 2 [IU] via SUBCUTANEOUS
  Administered 2023-10-10 – 2023-10-11 (×2): 1 [IU] via SUBCUTANEOUS

## 2023-10-09 MED ORDER — FLUTICASONE PROPIONATE 50 MCG/ACT NA SUSP
1.0000 | Freq: Two times a day (BID) | NASAL | Status: DC
  Administered 2023-10-10 – 2023-10-11 (×2): 1 via NASAL
  Filled 2023-10-09: qty 16

## 2023-10-09 MED ORDER — INSULIN ASPART 100 UNIT/ML IJ SOLN: 0.0000 [IU] | INTRAMUSCULAR | Status: DC

## 2023-10-09 MED ORDER — GADOBUTROL 1 MMOL/ML IV SOLN
7.0000 mL | Freq: Once | INTRAVENOUS | Status: AC | PRN
  Administered 2023-10-09: 7 mL via INTRAVENOUS

## 2023-10-09 MED ORDER — MORPHINE SULFATE (PF) 2 MG/ML IV SOLN
1.0000 mg | INTRAVENOUS | Status: DC | PRN
  Administered 2023-10-10: 1 mg via INTRAVENOUS
  Filled 2023-10-09: qty 1

## 2023-10-09 MED ORDER — SODIUM CHLORIDE 0.9 % IV SOLN: 250.0000 mL | INTRAVENOUS | Status: AC | PRN

## 2023-10-09 MED ORDER — ISOSORBIDE MONONITRATE ER 30 MG PO TB24
30.0000 mg | ORAL_TABLET | Freq: Every day | ORAL | Status: DC
Start: 2023-10-09 — End: 2023-10-09

## 2023-10-09 MED ORDER — LEVALBUTEROL HCL 0.63 MG/3ML IN NEBU: 0.6300 mg | INHALATION_SOLUTION | Freq: Four times a day (QID) | RESPIRATORY_TRACT | Status: DC | PRN

## 2023-10-09 MED ORDER — ONDANSETRON HCL 4 MG PO TABS: 4.0000 mg | ORAL_TABLET | Freq: Four times a day (QID) | ORAL | Status: DC | PRN

## 2023-10-09 NOTE — ED Notes (Signed)
Pt placed on 2 L o2.

## 2023-10-09 NOTE — Consult Note (Signed)
 Referring Provider: Dr. Toni Amend  Primary Care Physician:  [facility] Primary Gastroenterologist:  Gentry Fitz   Reason for Consultation: Acute pancreatitis  HPI: Charles Krueger is a 68 y.o. male with a past medical history of hypertension, coronary artery disease s/p MI, HFrecEF, COPD, OSA uses CPAP, pulmonary sarcoidosis, diabetes mellitus type 2, CKD stage 3b, chronic anemia, acute on chronic pancreatitis, pancreatic duct stricture, diverticulosis, colon polyps and lower GI bleed status post polypectomy 01/2023.  He presented to the ED 10/08/2023 for further evaluation regarding hyperglycemia and pancreatitis.  Patient reported undergoing a CTAP at the [place] earlier in the day 10/08/2023 which showed evidence of pancreatitis.  He endorses having upper abdominal pain for several months.  Labs in the ED showed a WBC count of 6.3.  Hemoglobin 15.6.  Hematocrit 48.7.  Platelet 156.  Sodium 133.  Potassium 4.5.  Glucose 518.  BUN 26.  Creatinine 2.35.  Calcium 10.4.  Total bili 0.5.  Alk phos 156.  AST 11.  ALT 12.  Lipase 710.  Labs today: WBC 6.3.  Hemoglobin 14.2.  Platelet 132.  He endorses having intermittent epigastric and LUQ pain which comes and goes for the past month but has occurred more frequently over the past week and a half.  No nausea or vomiting.  He has constipation for which she takes MiraLAX as needed.  He noted passing a black stool x 1 episode last week and occurred infrequently in the past as well.  He takes oral iron for many years.  No Pepto-Bismol use.  No NSAID use.  No bright red blood per the rectum.  Remote history of heavy alcohol use, decreased alcohol intake for 5 to 6 years then has been abstinent for the past 1-1/2 years.  His first episode of pancreatitis was diagnosed in 2008.  He underwent CTAP 10/08/2023 at the [facility] which showed a lobulated irregular pancreas with a 3 cm soft tissue density in the head of the pancreas with dilated pancreatic duct measuring 9  mm.  See results below.  No fevers or weight loss.  He has a history of a pancreatic duct stricture identified per EUS 07/21/2021 at [facility].  A repeat EUS 04/26/2022 showed periportal nodes with poorly defined margins measuring 18 x 10 mm in the pancreatic parenchyma had hyperechoic foci, hyperechoic strands and lobularity.  There was no mention of PD stricture on that report.  He denies having any further follow-up at [facility] since repeating the EUS 04/2022.  Abdominal MRI 07/15/2022 identified acute on chronic pancreatitis with inflammation to the head of the pancreas suggestive of groove pancreatitis with duodenitis without a pancreatic mass however there was prominence to the pancreatic head with a thick walled fluid collection and focal pancreatic ductal dilatation.     PAST IMAGE STUDIES:   Abdominal MRI with and without contrast 07/15/2022: 1. Constellation of findings most compatible with acute on chronic pancreatitis with inflammation centered about the pancreatic head and pancreaticoduodenal groove, query groove pancreatitis with duodenitis. 2. No definite pancreatic mass identified however there is prominence of the pancreatic head, a thick walled fluid collection anterior to the pancreatic head, 2 areas of focal pancreatic ductal dilation and marked inflammation/irregularity of the duodenal wall, above findings warrants further evaluation with EUS/ERCP to exclude underlying mass and evaluate the focal ductal dilation as well as possible drainage of the pseudocyst/walled-off collection. 3. Wedge-shaped areas of arterial hyperenhancement along the periphery of the right lobe of the liver  measure up to 2.2 cm these equilibrate to background liver on delayed postcontrast pulse sequences. This is favored to reflect transient hepatic intensity differences. Recommend attention on follow-up imaging. 4. Benign bilateral adrenal adenomas measure up to 2.4  cm in the left adrenal gland are stable from prior examinations and do not require independent imaging follow-up. 5. Colonic diverticulosis without findings of acute diverticulitis.  ECHO 05/12/2021: IMPRESSIONS Left ventricular ejection fraction, by estimation, is 55 to 60%. The left ventricle has normal function. The left ventricle demonstrates regional wall motion abnormalities with basal inferior akinesis. There is mild left ventricular hypertrophy. Left ventricular diastolic parameters are consistent with Grade I diastolic dysfunction (impaired relaxation). 1. Right ventricular systolic function is normal. The right ventricular size is normal. Tricuspid regurgitation signal is inadequate for assessing PA pressure. 2. 3. Left atrial size was mildly dilated. The aortic valve is tricuspid. Aortic valve regurgitation is not visualized. No aortic stenosis is present. 4. The mitral valve is normal in structure. Trivial mitral valve regurgitation. No evidence of mitral stenosis. 5. The inferior vena cava is normal in size with greater than 50% respiratory variability, suggesting right atrial pressure of 3 mmHg.   GI PROCEDURES:  Colonoscopy 02/12/2023 by Dr. Myrtie Neither as an inpatient status post colonoscopy with polypectomy for GI at the [facility] 6 days prior to admission. - Diverticulosis in the entire examined colon.  - A single erosion in the cecum.  - A single (solitary) ulcer in the distal ascending colon. Clips (MR conditional) were placed.  - The examination was otherwise normal on direct and retroflexion views.  - No specimens collected  [facility]/26/20[facility]: Periportal nodes with poorly-defined margins, measuring 18 x 10 mm  The pancreatic parenchyma was visualized in the entire pancreas. The  parenchyma had hyperechoic foci, hyperechoic strands and lobularity.  (Photos from lumen will need to be manually pushed over)   Recommendation   There is no recommended follow-up  for this procedure.   Pancreatic protocol CT vs MRI in 6 months   EUS 07/21/2021 [facility] secondary to dilated pancreatic duct: EUS was performed using a linear echoendoscope at 7.5 MHz frequency: The  head and uncinate pancreas were imaged from the duodenal bulb and the  second / third duodenum. The body and tail [partially] were imaged from  the gastric body and fundus.   Pancreas parenchyma: The parenchyma in the uncinate pancreas, head of the  pancreas, body and partially imaged tail of the pancreas was homogenous,  with scattered hyperechoic foci and hyperechoic strands.   Pancreas duct: The pancreas duct measured 2.2 mm in maximum diameter in  the head of the pancreas and 4.5 mm in maximum diameter in the body of the  pancreas. The duct was normal in echotexture. A stricture with abrupt  change in the caliber was noted in the PD in the region of the neck of the  pancreas. No mass lesion was seen. No intra-ductal stones were noted. No  dilated side-branches were noted.   Peri-pancreatic vasculature: Portal vein, splenic vein and porto-splenic  confluence were imaged and appeared normal. The imaged superior mesenteric  vein and artery were imaged and appeared normal.   Impression  A stricture with abrupt change in the caliber was noted in the PD in the  region of the neck of the pancreas. No mass lesion was seen    Past Medical History:  Diagnosis Date   COPD (chronic obstructive pulmonary disease) (HCC)  Diabetes mellitus without complication (HCC)    GERD (gastroesophageal reflux disease)    HTN (hypertension)    Myocardial infarction (HCC)    silent Mi several years ago   OSA on CPAP    Pneumonia    pt reports was mentioned in 12/23 at [facility] visit   Sarcoidosis     Past Surgical History:  Procedure Laterality Date   COLONOSCOPY N/A 02/12/2023   Procedure: COLONOSCOPY;  Surgeon: Sherrilyn Rist, MD;  Location: [facility] ENDOSCOPY;  Service:  Gastroenterology;  Laterality: N/A;   HEMOSTASIS CLIP PLACEMENT  02/12/2023   Procedure: HEMOSTASIS CLIP PLACEMENT;  Surgeon: Sherrilyn Rist, MD;  Location: [facility] ENDOSCOPY;  Service: Gastroenterology;;   INGUINAL HERNIA REPAIR Right 09/03/2022   Procedure: OPEN RIGHT INGUINAL HERNIA REPAIR WITH MESH;  Surgeon: Andria Meuse, MD;  Location: [facility] ORS;  Service: General;  Laterality: Right;   RIGHT/LEFT HEART CATH AND CORONARY ANGIOGRAPHY N/A 12/14/2020   Procedure: RIGHT/LEFT HEART CATH AND CORONARY ANGIOGRAPHY;  Surgeon: Marykay Lex, MD;  Location: [facility] INVASIVE CV LAB;  Service: Cardiovascular;  Laterality: N/A;    Prior to Admission medications   Medication Sig Start Date End Date Taking? Authorizing Provider  albuterol (VENTOLIN HFA) 108 (90 Base) MCG/ACT inhaler Inhale 2 puffs into the lungs every 6 (six) hours as needed for wheezing or shortness of breath.   Yes [provider]  atorvastatin (LIPITOR) 40 MG tablet Take 1 tablet (40 mg total) by mouth daily. Patient taking differently: Take 40 mg by mouth at bedtime. 01/09/21  Yes [name], NP  Azelastine HCl 137 MCG/SPRAY SOLN Inhale 2 sprays into the lungs in the morning and at bedtime. 09/13/23  Yes [provider]  calcium-vitamin D (OSCAL WITH D) 500-5 MG-MCG tablet Take 1 tablet by mouth daily.   Yes [provider]  carvedilol (COREG) 25 MG tablet Take 25 mg by mouth 2 (two) times daily with a meal.   Yes [provider]  cetirizine (ZYRTEC) 10 MG tablet Take 10 mg by mouth daily as needed for allergies. 03/26/23  Yes [provider]  cholecalciferol (VITAMIN D3) 25 MCG (1000 UNIT) tablet Take 1,000 Units by mouth daily.   Yes [provider]  docusate calcium (SURFAK) 240 MG capsule Take 240 mg by mouth daily.   Yes [provider]  empagliflozin (JARDIANCE) 10 MG TABS tablet Take 1 tablet (10 mg total) by mouth daily. 12/23/20  Yes [name], MD   Ferrous Sulfate (IRON) 325 (65 Fe) MG TABS Take 1 tablet (325 mg total) by mouth daily with breakfast. 02/13/23  Yes [name], MD  fluticasone [facility]) 50 MCG/ACT nasal spray Place 1 spray into both nostrils 2 (two) times daily.   Yes [provider]  folic acid (FOLVITE) 1 MG tablet Take 1 tablet by mouth daily. 08/26/23  Yes [provider]  hydrALAZINE (APRESOLINE) 50 MG tablet Take 1.5 tablets (75 mg total) by mouth 3 (three) times daily. Patient taking differently: Take 50 mg by mouth 3 (three) times daily. 05/12/21  Yes [name], MD  isosorbide mononitrate (IMDUR) 30 MG 24 hr tablet Take 1 tablet (30 mg total) by mouth daily. 03/14/21  Yes [name], [name], [name]  ketotifen (ZADITOR) 0.025 % ophthalmic solution Place 1 drop into both eyes 2 (two) times daily as needed (itchy eyes).   Yes [provider]  Mometasone Furoate 100 MCG/ACT AERO Inhale 2 puffs into the lungs as needed (  Wheezing; SOB).   Yes [provider]  montelukast (SINGULAIR) 10 MG tablet Take 10 mg by mouth daily.   Yes [provider]  Multiple Vitamin (MULTIVITAMIN WITH MINERALS) TABS tablet Take 1 tablet by mouth daily.   Yes [provider]  sacubitril-valsartan (ENTRESTO) 97-103 MG Take 1 tablet by mouth 2 times daily. 01/19/21  Yes [name], [name], [name]  sodium chloride (OCEAN) 0.65 % SOLN nasal spray Place 2 sprays into both nostrils as needed for congestion.   Yes [provider]  spironolactone (ALDACTONE) 25 MG tablet Take 1 tablet (25 mg total) by mouth daily. 02/13/21  Yes [name], MD  Tiotropium Bromide-Olodaterol (STIOLTO RESPIMAT) 2.5-2.5 MCG/ACT AERS Inhale 2 puffs into the lungs in the morning.   Yes [provider]    Current Facility-Administered Medications  Medication Dose Route Frequency Provider Last Rate Last Admin   0.9 %  sodium chloride infusion  250 mL Intravenous PRN Janalyn Shy, Subrina, MD        acetaminophen (TYLENOL) tablet 650 mg  650 mg Oral Q6H PRN Janalyn Shy, Subrina, MD       Or   acetaminophen (TYLENOL) suppository 650 mg  650 mg Rectal Q6H PRN Janalyn Shy, Subrina, MD       arformoterol Rosalyn Gess) nebulizer solution 15 mcg  15 mcg Nebulization BID Sundil, Subrina, MD       And   umeclidinium bromide (INCRUSE ELLIPTA) 62.5 MCG/ACT 1 puff  1 puff Inhalation Daily Sundil, Subrina, MD       atorvastatin (LIPITOR) tablet 40 mg  40 mg Oral Daily Sundil, Subrina, MD   40 mg at 10/09/23 1126   carvedilol (COREG) tablet 25 mg  25 mg Oral BID WC Sundil, Subrina, MD   25 mg at 10/09/23 1125   ferrous sulfate tablet 325 mg  325 mg Oral Q breakfast Sundil, Subrina, MD   325 mg at 10/09/23 0843   fluticasone (FLONASE) 50 MCG/ACT nasal spray 1 spray  1 spray Each Nare BID Janalyn Shy, Subrina, MD       heparin injection 5,000 Units  5,000 Units Subcutaneous Q8H Sundil, Subrina, MD   5,000 Units at 10/09/23 1421   hydrALAZINE (APRESOLINE) tablet 50 mg  50 mg Oral TID Janalyn Shy, Subrina, MD       insulin aspart (novoLOG) injection 0-6 Units  0-6 Units Subcutaneous Q4H Sundil, Subrina, MD   1 Units at 10/09/23 1152   isosorbide mononitrate (IMDUR) 24 hr tablet 30 mg  30 mg Oral Daily Sundil, Subrina, MD   30 mg at 10/09/23 1125   lactated ringers infusion   Intravenous Continuous Sundil, Subrina, MD   Stopped at 10/09/23 0827   levalbuterol (XOPENEX) nebulizer solution 0.63 mg  0.63 mg Nebulization Q6H PRN Sundil, Subrina, MD       montelukast (SINGULAIR) tablet 10 mg  10 mg Oral Daily Sundil, Subrina, MD   10 mg at 10/09/23 1126   morphine (PF) 2 MG/ML injection 1-2 mg  1-2 mg Intravenous Q2H PRN Janalyn Shy, Subrina, MD       ondansetron [facility]) tablet 4 mg  4 mg Oral Q6H PRN Janalyn Shy, Subrina, MD       Or   ondansetron [facility]) injection 4 mg  4 mg Intravenous Q6H PRN Sundil, Subrina, MD       sodium chloride flush (NS) 0.9 % injection 3 mL  3 mL Intravenous Q12H Sundil, Subrina, MD   3 mL at 10/09/23 1128   sodium  chloride flush (NS) 0.9 %  injection 3 mL  3 mL Intravenous PRN Sundil, Subrina, MD       Current Outpatient Medications  Medication Sig Dispense Refill   albuterol (VENTOLIN HFA) 108 (90 Base) MCG/ACT inhaler Inhale 2 puffs into the lungs every 6 (six) hours as needed for wheezing or shortness of breath.     atorvastatin (LIPITOR) 40 MG tablet Take 1 tablet (40 mg total) by mouth daily. (Patient taking differently: Take 40 mg by mouth at bedtime.) 30 tablet 1   Azelastine HCl 137 MCG/SPRAY SOLN Inhale 2 sprays into the lungs in the morning and at bedtime.     calcium-vitamin D (OSCAL WITH D) 500-5 MG-MCG tablet Take 1 tablet by mouth daily.     carvedilol (COREG) 25 MG tablet Take 25 mg by mouth 2 (two) times daily with a meal.     cetirizine (ZYRTEC) 10 MG tablet Take 10 mg by mouth daily as needed for allergies.     cholecalciferol (VITAMIN D3) 25 MCG (1000 UNIT) tablet Take 1,000 Units by mouth daily.     docusate calcium (SURFAK) 240 MG capsule Take 240 mg by mouth daily.     empagliflozin (JARDIANCE) 10 MG TABS tablet Take 1 tablet (10 mg total) by mouth daily. 90 tablet 3   Ferrous Sulfate (IRON) 325 (65 Fe) MG TABS Take 1 tablet (325 mg total) by mouth daily with breakfast. 30 tablet 0   fluticasone (FLONASE) 50 MCG/ACT nasal spray Place 1 spray into both nostrils 2 (two) times daily.     folic acid (FOLVITE) 1 MG tablet Take 1 tablet by mouth daily.     hydrALAZINE (APRESOLINE) 50 MG tablet Take 1.5 tablets (75 mg total) by mouth 3 (three) times daily. (Patient taking differently: Take 50 mg by mouth 3 (three) times daily.) 270 tablet 3   isosorbide mononitrate (IMDUR) 30 MG 24 hr tablet Take 1 tablet (30 mg total) by mouth daily. 90 tablet 3   ketotifen (ZADITOR) 0.025 % ophthalmic solution Place 1 drop into both eyes 2 (two) times daily as needed (itchy eyes).     Mometasone Furoate 100 MCG/ACT AERO Inhale 2 puffs into the lungs as needed (Wheezing; SOB).     montelukast (SINGULAIR) 10  MG tablet Take 10 mg by mouth daily.     Multiple Vitamin (MULTIVITAMIN WITH MINERALS) TABS tablet Take 1 tablet by mouth daily.     sacubitril-valsartan (ENTRESTO) 97-103 MG Take 1 tablet by mouth 2 times daily. 180 tablet 3   sodium chloride (OCEAN) 0.65 % SOLN nasal spray Place 2 sprays into both nostrils as needed for congestion.     spironolactone (ALDACTONE) 25 MG tablet Take 1 tablet (25 mg total) by mouth daily. 90 tablet 3   Tiotropium Bromide-Olodaterol (STIOLTO RESPIMAT) 2.5-2.5 MCG/ACT AERS Inhale 2 puffs into the lungs in the morning.      Allergies as of 10/08/2023 - Review Complete 10/08/2023  Allergen Reaction Noted   Zestril [lisinopril] Other (See Comments)     Family History  Problem Relation Age of Onset   CAD Mother     Social History   Socioeconomic History   Marital status: Married    Spouse name: Not on file   Number of children: Not on file   Years of education: Not on file   Highest education level: Not on file  Occupational History   Not on file  Tobacco Use   Smoking status: Some Days    Types: Cigars    Start date: 2002  Smokeless tobacco: Never   Tobacco comments:    3-4 Cigars per week  Vaping Use   Vaping status: Never Used  Substance and Sexual Activity   Alcohol use: Yes    Alcohol/week: 6.0 - 7.0 standard drinks of alcohol    Types: 6 - 7 Shots of liquor per week    Comment: occAs   Drug use: No   Sexual activity: Not on file  Other Topics Concern   Not on file  Social History Narrative   Not on file   Social Drivers of Health   Financial Resource Strain: Not on file  Food Insecurity: No Food Insecurity (01/05/2021)   Received from [facility], [facility]   Hunger Vital Sign    Worried About Running Out of Food in the Last Year: Never true    Ran Out of Food in the Last Year: Never true  Transportation Needs: Not on file  Physical Activity: Not on file  Stress: No Stress Concern Present (10/13/2020)   Received from  [facility], [facility] - Occupational Stress Questionnaire    Feeling of Stress : Not at all  Social Connections: Unknown (11/06/2021)   Received from [facility], [facility]   Social Network    Social Network: Not on file  Intimate Partner Violence: Unknown (09/28/2021)   Received from [facility], [facility]   HITS    Physically Hurt: Not on file    Insult or Talk Down To: Not on file    Threaten Physical Harm: Not on file    Scream or Curse: Not on file    Review of Systems: Gen: Denies fever, sweats or chills. No weight loss.  CV: Denies chest pain, palpitations or edema. Resp: Denies cough, shortness of breath of hemoptysis.  GI: HPI.  GU : Denies urinary burning, blood in urine, increased urinary frequency or incontinence. MS: Denies joint pain, muscles aches or weakness. Derm: Denies rash, itchiness, skin lesions or unhealing ulcers. Psych: Denies depression, anxiety, memory loss or confusion. Heme: Denies easy bruising, bleeding. Neuro:  Denies headaches, dizziness or paresthesias. Endo:   DM type II.  Physical Exam: Vital signs in last 24 hours: Temp:  [97.9 F (36.6 C)-98.5 F (36.9 C)] 98.2 F (36.8 C) (04/16 1130) Pulse Rate:  [68-92] 72 (04/16 1130) Resp:  [15-30] 15 (04/16 1130) BP: (133-176)/(82-125) 169/101 (04/16 1130) SpO2:  [89 %-100 %] 94 % (04/16 1130) Weight:  [77.1 kg] 77.1 kg (04/15 1526)   General:  Alert, well-developed, well-nourished, pleasant and cooperative in NAD. Head:  Normocephalic and atraumatic. Eyes:  No scleral icterus. Conjunctiva pink. Ears:  Normal auditory acuity. Nose:  No deformity, discharge or lesions. Mouth: Poor dentition.  No ulcers or lesions.  Neck:  Supple. No lymphadenopathy or thyromegaly.  Lungs: Breath sounds clear throughout. No wheezes, rhonchi or crackles.  Heart: Heart rhythm slightly irregular, no murmur.  Monitor showing sinus rhythm with occasional  PVC. Abdomen:, Nondistended.  Mild epigastric and LUQ tenderness without rebound or guarding.  Positive bowel sounds to all 4 quadrants.  No palpable mass. Rectal: Deferred. Musculoskeletal:  Symmetrical without gross deformities.  Pulses:  Normal pulses noted. Extremities:  Without clubbing or edema. Neurologic:  Alert and  oriented x 4. No focal deficits.  Skin:  Intact without significant lesions or rashes. Psych:  Alert and cooperative. Normal mood and affect.  Intake/Output from previous day: No intake/output data recorded. Intake/Output this shift: Total I/O In: 318.7 [  I.V.:318.7] Out: -   Lab Results: Recent Labs    10/08/23 1559 10/09/23 0313 10/09/23 0604  WBC 6.3  --  6.3  HGB 15.6 15.3 14.2  HCT 48.7 45.0 43.9  PLT 156  --  132*   BMET Recent Labs    10/08/23 1559 10/09/23 0313 10/09/23 0604  NA 133* 139 138  K 4.5 4.3 4.0  CL 102  --  108  CO2 19*  --  21*  GLUCOSE 518*  --  213*  BUN 26*  --  23  CREATININE 2.35*  --  1.89*  CALCIUM 10.4*  --  10.0   LFT Recent Labs    10/09/23 0604  PROT 5.7*  ALBUMIN 2.8*  AST 10*  ALT 11  ALKPHOS 129*  BILITOT 0.3   PT/INR No results for input(s): "LABPROT", "INR" in the last 72 hours. Hepatitis Panel No results for input(s): "HEPBSAG", "HCVAB", "HEPAIGM", "HEPBIGM" in the last 72 hours.   Studies/Results: No results found.  IMPRESSION/PLAN:  68 year old male with a history of acute on chronic pancreatitis with prior PD stricture per EUS 06/2021. 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  CTAP done at the [place] on 10/08/2023 showed a lobulated irregular pancreas with a 3 cm soft tissue density in the head of the pancreas with dilated pancreatic duct measuring 9 mm.  Normal LFTs.  Lipase 710.  Calcium 10.4 -> 10.  Hemodynamically stable. -Abdominal MRI/MRCP with and without contrast -May require  EUS -Clear liquid diet -IV fluids and pain management per the hospitalist -Pantoprazole 40 mg p.o. daily -CBC, CMP, lipase and triglyceride level in a.m. -Await further recommendations per Dr. Rosaline Coma  AKI on CKD stage IIIb  Chronic iron deficiency anemia.  Hospitalized with acute on chronic anemia 07/2023 with admission hemoglobin 6.4.  FOBT negative.  Transfused 2 units of PRBCs and received IV iron.  Remains on oral iron.   History of colon polyps, most recent colonoscopy at the [facility] 01/2023 required hospitalization for post polypectomy GI bleed - [facility] surveillance colonoscopies   CAD  COPD/OSA/pulmonary sarcoidosis  DM type II      Tory Freiberg  10/09/2023, 3:32PM

## 2023-10-09 NOTE — ED Provider Notes (Signed)
 [facility] AT [facility] Provider Note   CSN: [id number] Arrival date & time: 10/08/23  1439     History  Chief Complaint  Patient presents with   Hyperglycemia   Abdominal Pain    Charles Krueger is a 68 y.o. male.  Patient sent from [place] where he gets his routine health care for treatment of acute pancreatitis and hyperglycemia. He denies nausea or vomiting. No fever. No reported history of DKA. He reports his blood sugar has been elevated since taking a 3-day course of prednisone about 3 weeks ago. No SOB, diarrhea, chest pain, headache, lightheadedness.   The history is provided by the patient. No language interpreter was used.  Hyperglycemia Associated symptoms: abdominal pain   Abdominal Pain      Home Medications Prior to Admission medications   Medication Sig Start Date End Date Taking? Authorizing Provider  albuterol  (VENTOLIN  HFA) 108 (90 Base) MCG/ACT inhaler Inhale 2 puffs into the lungs every 6 (six) hours as needed for wheezing or shortness of breath.    [provider]  atorvastatin  (LIPITOR) 40 MG tablet Take 1 tablet (40 mg total) by mouth daily. 01/09/21   Walker, [name], NP  carvedilol  (COREG ) 25 MG tablet Take 25 mg by mouth 2 (two) times daily with a meal.    [provider]  cetirizine (ZYRTEC) 10 MG tablet Take 10 mg by mouth daily as needed for allergies. 03/26/23   [provider]  cholecalciferol (VITAMIN D3) 25 MCG (1000 UNIT) tablet Take 1,000 Units by mouth daily.    [provider]  empagliflozin  (JARDIANCE ) 10 MG TABS tablet Take 1 tablet (10 mg total) by mouth daily. 12/23/20   Cherylene Corrente, MD  Ferrous Sulfate  (IRON ) 325 (65 Fe) MG TABS Take 1 tablet (325 mg total) by mouth daily with breakfast. 02/13/23   Josiah Nigh, MD  fluticasone  (FLONASE ) 50 MCG/ACT nasal spray Place 1 spray into both nostrils 2 (two) times daily.    [provider]  hydrALAZINE  (APRESOLINE ) 50  MG tablet Take 1.5 tablets (75 mg total) by mouth 3 (three) times daily. Patient taking differently: Take 50 mg by mouth 3 (three) times daily. 05/12/21   Darlis Eisenmenger, MD  isosorbide  mononitrate (IMDUR ) 30 MG 24 hr tablet Take 1 tablet (30 mg total) by mouth daily. 03/14/21   [name], Arlice Bene, [name]  ketotifen (ZADITOR) 0.025 % ophthalmic solution Place 1 drop into both eyes 2 (two) times daily as needed (itchy eyes).    [provider]  montelukast  (SINGULAIR ) 10 MG tablet Take 10 mg by mouth daily.    [provider]  Multiple Vitamin (MULTIVITAMIN WITH MINERALS) TABS tablet Take 1 tablet by mouth daily.    [provider]  sacubitril -valsartan  (ENTRESTO ) 97-103 MG Take 1 tablet by mouth 2 times daily. 01/19/21   [name], Arlice Bene, [name]  spironolactone  (ALDACTONE ) 25 MG tablet Take 1 tablet (25 mg total) by mouth daily. 02/13/21   McLean, [name], MD  Tiotropium Bromide-Olodaterol (STIOLTO RESPIMAT) 2.5-2.5 MCG/ACT AERS Inhale 2 puffs into the lungs in the morning.    [provider]      Allergies    Zestril [lisinopril]    Review of Systems   Review of Systems  Gastrointestinal:  Positive for abdominal pain.    Physical Exam Updated Vital Signs BP (!) 158/89 (BP Location: Left Arm)   Pulse 68   Temp 98.3 F (36.8 C) (Oral)   Resp  15   Ht 5\' 9"  (1.753 m)   Wt 77.1 kg   SpO2 93%   BMI 25.10 kg/m  Physical Exam Vitals and nursing note reviewed.  Constitutional:      Appearance: He is well-developed.  HENT:     Head: Normocephalic.  Cardiovascular:     Rate and Rhythm: Normal rate and regular rhythm.     Heart sounds: No murmur heard. Pulmonary:     Effort: Pulmonary effort is normal.     Breath sounds: Normal breath sounds. No wheezing, rhonchi or rales.  Abdominal:     General: Bowel sounds are normal.     Palpations: Abdomen is soft.     Tenderness: There is abdominal tenderness in the left upper quadrant. There is no guarding or  rebound.  Musculoskeletal:        General: Normal range of motion.     Cervical back: Normal range of motion and neck supple.  Skin:    General: Skin is warm and dry.  Neurological:     General: No focal deficit present.     Mental Status: He is alert and oriented to person, place, and time.     ED Results / Procedures / Treatments   Labs (all labs ordered are listed, but only abnormal results are displayed) Labs Reviewed  COMPREHENSIVE METABOLIC PANEL WITH GFR - Abnormal; Notable for the following components:      Result Value   Sodium 133 (*)    CO2 19 (*)    Glucose, Bld 518 (*)    BUN 26 (*)    Creatinine, Ser 2.35 (*)    Calcium  10.4 (*)    AST 11 (*)    Alkaline Phosphatase 156 (*)    GFR, Estimated 30 (*)    All other components within normal limits  LIPASE, BLOOD - Abnormal; Notable for the following components:   Lipase 710 (*)    All other components within normal limits  CBC WITH DIFFERENTIAL/PLATELET - Abnormal; Notable for the following components:   RDW 21.7 (*)    All other components within normal limits  CBG MONITORING, ED - Abnormal; Notable for the following components:   Glucose-Capillary 333 (*)    All other components within normal limits  I-STAT VENOUS BLOOD GAS, ED - Abnormal; Notable for the following components:   pO2, Ven 22 (*)    Acid-base deficit 6.0 (*)    All other components within normal limits  URINALYSIS, ROUTINE W REFLEX MICROSCOPIC   Results for orders placed or performed during the hospital encounter of 10/08/23  Comprehensive metabolic panel   Collection Time: 10/08/23  3:59 PM  Result Value Ref Range   Sodium 133 (L) 135 - 145 mmol/L   Potassium 4.5 3.5 - 5.1 mmol/L   Chloride 102 98 - 111 mmol/L   CO2 19 (L) 22 - 32 mmol/L   Glucose, Bld 518 (HH) 70 - 99 mg/dL   BUN 26 (H) 8 - 23 mg/dL   Creatinine, Ser 8.11 (H) 0.61 - 1.24 mg/dL   Calcium  10.4 (H) 8.9 - 10.3 mg/dL   Total Protein 6.9 6.5 - 8.1 g/dL   Albumin  3.5 3.5 -  5.0 g/dL   AST 11 (L) 15 - 41 U/L   ALT 12 0 - 44 U/L   Alkaline Phosphatase 156 (H) 38 - 126 U/L   Total Bilirubin 0.5 0.0 - 1.2 mg/dL   GFR, Estimated 30 (L) >60 mL/min   Anion gap 12 5 -  15  Lipase, blood   Collection Time: 10/08/23  3:59 PM  Result Value Ref Range   Lipase 710 (H) 11 - 51 U/L  CBC with Diff   Collection Time: 10/08/23  3:59 PM  Result Value Ref Range   WBC 6.3 4.0 - 10.5 K/uL   RBC 5.60 4.22 - 5.81 MIL/uL   Hemoglobin 15.6 13.0 - 17.0 g/dL   HCT 82.9 56.2 - 13.0 %   MCV 87.0 80.0 - 100.0 fL   MCH 27.9 26.0 - 34.0 pg   MCHC 32.0 30.0 - 36.0 g/dL   RDW 86.5 (H) 78.4 - 69.6 %   Platelets 156 150 - 400 K/uL   nRBC 0.0 0.0 - 0.2 %   Neutrophils Relative % 68 %   Neutro Abs 4.3 1.7 - 7.7 K/uL   Lymphocytes Relative 22 %   Lymphs Abs 1.4 0.7 - 4.0 K/uL   Monocytes Relative 7 %   Monocytes Absolute 0.4 0.1 - 1.0 K/uL   Eosinophils Relative 2 %   Eosinophils Absolute 0.1 0.0 - 0.5 K/uL   Basophils Relative 1 %   Basophils Absolute 0.0 0.0 - 0.1 K/uL   Immature Granulocytes 0 %   Abs Immature Granulocytes 0.02 0.00 - 0.07 K/uL  CBG monitoring, ED   Collection Time: 10/09/23  2:34 AM  Result Value Ref Range   Glucose-Capillary 333 (H) 70 - 99 mg/dL  I-Stat venous blood gas, ([facility] ED, [facility], [facility])   Collection Time: 10/09/23  3:13 AM  Result Value Ref Range   pH, Ven 7.285 7.25 - 7.43   pCO2, Ven 44.1 44 - 60 mmHg   pO2, Ven 22 (LL) 32 - 45 mmHg   Bicarbonate 21.0 20.0 - 28.0 mmol/L   TCO2 22 22 - 32 mmol/L   O2 Saturation 32 %   Acid-base deficit 6.0 (H) 0.0 - 2.0 mmol/L   Sodium 139 135 - 145 mmol/L   Potassium 4.3 3.5 - 5.1 mmol/L   Calcium , Ion 1.32 1.15 - 1.40 mmol/L   HCT 45.0 39.0 - 52.0 %   Hemoglobin 15.3 13.0 - 17.0 g/dL   Sample type VENOUS    Comment NOTIFIED PHYSICIAN     EKG None  Radiology No results found.  Procedures Procedures    Medications Ordered in ED Medications  sodium chloride  0.9 % bolus 1,000 mL (1,000 mLs  Intravenous New Bag/Given 10/09/23 0242)  hydrALAZINE  (APRESOLINE ) tablet 50 mg (50 mg Oral Given 10/09/23 0318)    ED Course/ Medical Decision Making/ A&P                                 Medical Decision Making This patient presents to the ED for concern of abdominal pain, this involves an extensive number of treatment options, and is a complaint that carries with it a high risk of complications and morbidity.  The differential diagnosis includes PUD, pancreatitis, colitis, diverticulitis, perforation, obstruction, AAA, kidney stone, infection, ischemia   Co morbidities that complicate the patient evaluation  NIDDM, sarcoidosis, GERD, COPD, CAD, HTN, OSA   Additional history obtained:  Additional history and/or information obtained from chart review, notable for recent ED visit for hyperglycemia - discharged home   Lab Tests:  I Ordered, and personally interpreted labs.  The pertinent results include:  WBC 6.3, hgb 15.6; Na 133, K+ 4.5, CO2 19, Glucose 518 -->333, BUN 26, Cr 2.35 (within baseline); Alk Phos 156, lipase  710    Imaging Studies ordered: CT abd/pel through [place] reviewed c/w pancreatitis   Cardiac Monitoring:  The patient was maintained on a cardiac monitor.  I personally viewed and interpreted the cardiac monitored which showed an underlying rhythm of: n/a     Test Considered:  N/a   Critical Interventions:  N/a   Consultations Obtained:  I requested consultation with the n/a,  and discussed lab and imaging findings as well as pertinent plan - they recommend: n/a   Problem List / ED Course:  Sent to ED from [place] reference hyperglycemia and pancreatitis Patient's CBG elevated on arrival at 518, decreased to 333 upon getting to a room. No evidence of DKA. Abdomen tender c/w diagnosis of pancreatitis Blood pressure elevated 165/125 - no chest pain, SOB Hasn't been home to take other than morning HTN medication - hydralazine  50 mg ordered as per home  dose. Will monitor. IV fluids running - CBG q 1 hour Admit for ongoing treatment/stabilization  3:50 - repeat blood pressure 158/89  Discussed with Dr. Sundil, [facility], who accepts for admission.    Reevaluation:  After the interventions noted above, I reevaluated the patient and found that they have :improved   Social Determinants of Health:  N/a   Disposition:  After consideration of the diagnostic results and the patients response to treatment, I feel that the patient would benefit from admit.   Risk Prescription drug management. Decision regarding hospitalization.           Final Clinical Impression(s) / ED Diagnoses Final diagnoses:  Acute pancreatitis without infection or necrosis, unspecified pancreatitis type  Hyperglycemia    Rx / DC Orders ED Discharge Orders     None         Mandy Second, PA-C 10/09/23 0358    Lindle Rhea, MD 10/11/23 1825

## 2023-10-09 NOTE — H&P (Signed)
 History and Physical    Charles Krueger ZOX:[id number] DOB: 1956/02/23 DOA: 10/08/2023  PCP: [facility]   Patient coming from: Home   Chief Complaint:  Chief Complaint  Patient presents with   Hyperglycemia   Abdominal Pain   ED TRIAGE note:Patient arrives ambulatory by POV with paperwork from [place] c/o hyperglycemia and pancreatitis. Patient had CT scan done at [facility] today. C/o left abdomen discomfort. Denies any N/V/D   HPI:  Charles Krueger is a 68 y.o. male with medical history significant of CAD, heart diastolic heart failure, essential hypertension, COPD, DM type II, GERD, essential hypertension, OSA, pulmonary sarcoidosis, pulmonary hypertension, diverticulosis, CKD stage IIIb, hepatic cirrhosis and chronic anemia referred from [place] to the ED for the management of acute pancreatitis and hyperglycemia. Patient reported at home her blood glucose has been elevated after taking 3 days course of prednisone which is about 3 weeks ago.  Patient is also complaining about abdominal pain.  Denies any nausea, vomiting, chest pain, fever, chills, diarrhea, headache and lightheadedness.   ED Course:  At presentation to ED patient is hemodynamically stable. CMP showing Cardide blood glucose 143, low bicarb 19, blood glucose 518, elevated creatinine 2.453, elevated calcium 10.4, elevated AST 156 and GFR 30. VBG showing normal pH 7.2, pCO2 44, O2 22 bicarb 21. POC blood glucose 333. Elevated lipase level 710.  Per chart review of the CT scan result at [facility] on 10/08/2023: ABDOMEN: . Liver: Hepatic steatosis suggested. Right hepatic lobe measures 15 cm in the CC dimension.. Gallbladder/biliary: Mildly distended. No calcified gallstones  are identified. No pericholecystic inflammation. Marland Kitchen  Spleen:Unenhanced spleen measures 10 cm.   Lobulated and irregular appearance to the pancreas,  with a soft tissue density within the region of the pancreatic  head measures 3 cm, with microcalcifications.  Adjacent  hypodensity measuring approximately 15 mm, better appreciated on  prior contrasted exam. There is dilatation of the pancreatic duct  measures up to 9 mm. Suspect calcifications within the distal  pancreatic duct measuring up to 7 mm. There is mild to moderate  peripancreatic inflammatory changes. No soft tissue gas or  drainable fluid collections. .  Adrenals: Adrenal nodules are seen bilaterally, on the left measures up to 3 cm, and on the right measures up to 2 cm, larger in size in the interval. .  Kidneys: Negative for hydronephrosis or renal calculi. Marland Kitchen  Peritoneum/mesenteries: Within normal limits. .  Extraperitoneum: Within normal limits. . Gastrointestinal tract:No bowel obstruction is identified. Mild to moderate amounts of fecal retention. Appendix is negative. No pericolonic inflammatory changes. Diverticula involving the distal descending and sigmoid colon. . Vascular: Vascular calcifications within the abdominal aorta and iliac arteries.   PELVIS: . Peritoneum: No pelvic lymphadenopathy. . Ureters: Within normal limits. . Bladder: Partially decompressed. .  Reproductive System: Prostate gland is stable.   Fat-containing umbilical hernia measures 1.8 x 1.8 cm    In the ED patient has been given 1 L of NS bolus and hydralazine 50 mg. Blood glucose has been improved to 300 after giving 1 L of NS bolus.   Hospitalist has been consulted for management of hyperglycemic crisis, acute pancreatitis, pancreatic mass and acute kidney injury on CKD stage IIIb.  Significant labs in the ED: Lab Orders         Comprehensive metabolic panel         Lipase, blood         CBC with Diff         Osmolality  Urinalysis, Routine w reflex microscopic -Urine, Clean Catch         Comprehensive metabolic panel         CBC         CBG monitoring, ED         I-Stat venous blood gas, ([facility] ED, [facility], [facility])         CBG monitoring, ED       Review of Systems:  Review of  Systems  Constitutional:  Negative for chills, fever, malaise/fatigue and weight loss.  Respiratory:  Negative for cough, sputum production and shortness of breath.   Cardiovascular:  Negative for chest pain and palpitations.  Gastrointestinal:  Positive for abdominal pain. Negative for constipation, diarrhea, heartburn, nausea and vomiting.  Genitourinary:  Negative for dysuria, frequency and urgency.       Denies any polyuria and polydipsia  Neurological:  Negative for dizziness and headaches.  Psychiatric/Behavioral:  The patient is not nervous/anxious.     Past Medical History:  Diagnosis Date   COPD (chronic obstructive pulmonary disease) (HCC)    Diabetes mellitus without complication (HCC)    GERD (gastroesophageal reflux disease)    HTN (hypertension)    Myocardial infarction (HCC)    silent Mi several years ago   OSA on CPAP    Pneumonia    pt reports was mentioned in 12/23 at [facility] visit   Sarcoidosis     Past Surgical History:  Procedure Laterality Date   COLONOSCOPY N/A 02/12/2023   Procedure: COLONOSCOPY;  Surgeon: Sherrilyn Rist, MD;  Location: [facility] ENDOSCOPY;  Service: Gastroenterology;  Laterality: N/A;   HEMOSTASIS CLIP PLACEMENT  02/12/2023   Procedure: HEMOSTASIS CLIP PLACEMENT;  Surgeon: Sherrilyn Rist, MD;  Location: [facility] ENDOSCOPY;  Service: Gastroenterology;;   INGUINAL HERNIA REPAIR Right 09/03/2022   Procedure: OPEN RIGHT INGUINAL HERNIA REPAIR WITH MESH;  Surgeon: Andria Meuse, MD;  Location: [facility] ORS;  Service: General;  Laterality: Right;   RIGHT/LEFT HEART CATH AND CORONARY ANGIOGRAPHY N/A 12/14/2020   Procedure: RIGHT/LEFT HEART CATH AND CORONARY ANGIOGRAPHY;  Surgeon: Marykay Lex, MD;  Location: [facility] INVASIVE CV LAB;  Service: Cardiovascular;  Laterality: N/A;     reports that he has been smoking cigars. He started smoking about 23 years ago. He has never used smokeless tobacco. He reports current alcohol use of about 6.0 - 7.0  standard drinks of alcohol per week. He reports that he does not use drugs.  Allergies  Allergen Reactions   Zestril [Lisinopril] Other (See Comments)    Acute renal failure    Family History  Problem Relation Age of Onset   CAD Mother     Prior to Admission medications   Medication Sig Start Date End Date Taking? Authorizing Provider  albuterol (VENTOLIN HFA) 108 (90 Base) MCG/ACT inhaler Inhale 2 puffs into the lungs every 6 (six) hours as needed for wheezing or shortness of breath.    [provider]  atorvastatin (LIPITOR) 40 MG tablet Take 1 tablet (40 mg total) by mouth daily. 01/09/21   [name], NP  carvedilol (COREG) 25 MG tablet Take 25 mg by mouth 2 (two) times daily with a meal.    [provider]  cetirizine (ZYRTEC) 10 MG tablet Take 10 mg by mouth daily as needed for allergies. 03/26/23   [provider]  cholecalciferol (VITAMIN D3) 25 MCG (1000 UNIT) tablet Take 1,000 Units by mouth daily.    [provider]  empagliflozin (JARDIANCE)  10 MG TABS tablet Take 1 tablet (10 mg total) by mouth daily. 12/23/20   [name], MD  Ferrous Sulfate (IRON) 325 (65 Fe) MG TABS Take 1 tablet (325 mg total) by mouth daily with breakfast. 02/13/23   [name], MD  fluticasone [facility]) 50 MCG/ACT nasal spray Place 1 spray into both nostrils 2 (two) times daily.    [provider]  hydrALAZINE (APRESOLINE) 50 MG tablet Take 1.5 tablets (75 mg total) by mouth 3 (three) times daily. Patient taking differently: Take 50 mg by mouth 3 (three) times daily. 05/12/21   [name], MD  isosorbide mononitrate (IMDUR) 30 MG 24 hr tablet Take 1 tablet (30 mg total) by mouth daily. 03/14/21   [name], [name], [name]  ketotifen (ZADITOR) 0.025 % ophthalmic solution Place 1 drop into both eyes 2 (two) times daily as needed (itchy eyes).    [provider]  montelukast (SINGULAIR) 10 MG tablet Take 10 mg by mouth daily.     [provider]  Multiple Vitamin (MULTIVITAMIN WITH MINERALS) TABS tablet Take 1 tablet by mouth daily.    [provider]  sacubitril-valsartan (ENTRESTO) 97-103 MG Take 1 tablet by mouth 2 times daily. 01/19/21   [name], [name]  spironolactone (ALDACTONE) 25 MG tablet Take 1 tablet (25 mg total) by mouth daily. 02/13/21   [name], MD  Tiotropium Bromide-Olodaterol (STIOLTO RESPIMAT) 2.5-2.5 MCG/ACT AERS Inhale 2 puffs into the lungs in the morning.    [provider]     Physical Exam: Vitals:   10/09/23 0242 10/09/23 0348 10/09/23 0352 10/09/23 0435  BP: (!) 165/125 (!) 158/89  (!) 176/100  Pulse: 76 68  73  Resp: 16 15  (!) 25  Temp: 98.3 F (36.8 C)     TempSrc: Oral     SpO2: 92% (!) 89% 93% 93%  Weight:      Height:        Physical Exam Vitals and nursing note reviewed.  Constitutional:      Appearance: He is not ill-appearing.  Eyes:     Pupils: Pupils are equal, round, and reactive to light.  Cardiovascular:     Rate and Rhythm: Normal rate and regular rhythm.     Heart sounds: Normal heart sounds.  Abdominal:     General: Bowel sounds are normal.     Palpations: Abdomen is soft.     Tenderness: There is abdominal tenderness in the epigastric area. There is no guarding or rebound.  Skin:    General: Skin is dry.     Capillary Refill: Capillary refill takes less than 2 seconds.  Neurological:     Mental Status: He is alert.  Psychiatric:        Attention and Perception: Attention normal.        Mood and Affect: Mood is not anxious or elated. Affect is blunt and flat. Affect is not angry or tearful.        Speech: Speech normal.        Behavior: Behavior normal.        Thought Content: Thought content normal.        Cognition and Memory: Cognition normal.        Judgment: Judgment normal.      Labs on Admission: I have personally reviewed following labs and imaging studies  CBC: Recent Labs  Lab 10/08/23 1559  10/09/23 0313  WBC 6.3  --   NEUTROABS 4.3  --  HGB 15.6 15.3  HCT 48.7 45.0  MCV 87.0  --   PLT 156  --    Basic Metabolic Panel: Recent Labs  Lab 10/08/23 1559 10/09/23 0313  NA 133* 139  K 4.5 4.3  CL 102  --   CO2 19*  --   GLUCOSE 518*  --   BUN 26*  --   CREATININE 2.35*  --   CALCIUM 10.4*  --    GFR: Estimated Creatinine Clearance: 30.9 mL/min (A) (by C-G formula based on SCr of 2.35 mg/dL (H)). Liver Function Tests: Recent Labs  Lab 10/08/23 1559  AST 11*  ALT 12  ALKPHOS 156*  BILITOT 0.5  PROT 6.9  ALBUMIN 3.5   Recent Labs  Lab 10/08/23 1559  LIPASE 710*   No results for input(s): "AMMONIA" in the last 168 hours. Coagulation Profile: No results for input(s): "INR", "PROTIME" in the last 168 hours. Cardiac Enzymes: No results for input(s): "CKTOTAL", "CKMB", "CKMBINDEX", "TROPONINI", "TROPONINIHS" in the last 168 hours. BNP (last 3 results) No results for input(s): "BNP" in the last 8760 hours. HbA1C: No results for input(s): "HGBA1C" in the last 72 hours. CBG: Recent Labs  Lab 10/09/23 0234 10/09/23 0425  GLUCAP 333* 288*   Lipid Profile: No results for input(s): "CHOL", "HDL", "LDLCALC", "TRIG", "CHOLHDL", "LDLDIRECT" in the last 72 hours. Thyroid Function Tests: No results for input(s): "TSH", "T4TOTAL", "FREET4", "T3FREE", "THYROIDAB" in the last 72 hours. Anemia Panel: No results for input(s): "VITAMINB12", "FOLATE", "FERRITIN", "TIBC", "IRON", "RETICCTPCT" in the last 72 hours. Urine analysis:    Component Value Date/Time   COLORURINE YELLOW 09/30/2023 1241   APPEARANCEUR CLEAR 09/30/2023 1241   LABSPEC 1.024 09/30/2023 1241   PHURINE 5.0 09/30/2023 1241   GLUCOSEU >=500 (A) 09/30/2023 1241   HGBUR NEGATIVE 09/30/2023 1241   BILIRUBINUR NEGATIVE 09/30/2023 1241   KETONESUR NEGATIVE 09/30/2023 1241   PROTEINUR 100 (A) 09/30/2023 1241   UROBILINOGEN 0.2 06/07/2014 2103   NITRITE NEGATIVE 09/30/2023 1241   LEUKOCYTESUR  NEGATIVE 09/30/2023 1241    Radiological Exams on Admission: I have personally reviewed images No results found.    Assessment/Plan: Principal Problem:   Acute pancreatitis Active Problems:   Hyperglycemic crisis due to diabetes mellitus (HCC)   Pancreatic mass   Acute kidney injury superimposed on chronic kidney disease (HCC)   COPD (chronic obstructive pulmonary disease) (HCC)   Obstructive sleep apnea   Non-insulin dependent type 2 diabetes mellitus (HCC)   CAD (coronary artery disease)   Pulmonary sarcoidosis (HCC)   Chronic diastolic CHF (congestive heart failure) (HCC)   Chronic anemia   Essential hypertension   History of CAD (coronary artery disease)   GERD (gastroesophageal reflux disease)   Diverticulosis   Hepatic steatosis    Assessment and Plan: Acute pancreatitis Pancreatic mass -Patient has been referred from [place] as CT scan showed patient has evidence of pancreatitis and patient also found to have elevated blood glucose about 500 range.  (Record available on encounter> office visit>10/08/2023) -Presentation to ED patient is hemodynamically stable except blood pressure 165/125 which has been improved to 150/89.  O2 sat 93% on 2 L oxygen. -CBC unremarkable.  CMP showed elevated blood glucose 508, low anion gap 19, normal anion gap, elevated creatinine 2.35.  And elevated AST/ALT.  VBG showed normal pH, pCO2 and bicarb level. -Patient is complaining about midepigastric gastric abdominal pain. - Elevated lipase 710. - Outside records CT abdomen pelvis showing no evidence of gallstone however there is a irregularity of  the pancreatic head concern for pancreatic mass about 2 cm and pancreatic inflammation. -Concern for acute pancreatitis in the setting of pancreatic mass and probably in the setting of concomitant side effect of Jardiance. -In the ED patient has been treated with 1 L of LR bolus.  Giving another liter of LR bolus and continue LR 125 cc/h. -Continue pain  control.  Starting clear liquid diet. -Consult gastroenterology for the assessment for pancreatic mass.   Hyperglycemic crisis Non-insulin-dependent DM type II -History of non-insulin-dependent DM type II.  A1c 5.7 in February 2025 -CMP showed elevated blood glucose 508, low anion gap 19, normal anion gap, elevated creatinine 2.35.  And elevated AST/ALT.  VBG showed normal pH, pCO2, pO2 and bicarb level. -Giving 1 L of LR bolus blood glucose has been improved to 333.  Most recent blood glucose is 288. -Hyperglycemic crisis in the setting of recent steroid use and acute pancreatitis. - Received total 2 L of LR bolus.  Continue maintenance fluid LR 125 cc/h - Starting low SSI 0 to 6 unit every 4 hour with POC blood glucose check every 4 hours.  Once blood glucose will be more normal range and patient can tolerate oral food can transition to heart failure modified diet. -Holding Jardiance in the setting of acute pancreatitis.   History of COPD Obstructive sleep apnea -Stable.  O2 sat dropped to 89% room air currently 93% on 1 L oxygen. - Continue Incruse Ellipta and Brovana.  Continue DuoNeb as needed  History of CAD -Continue Coreg Lipitor and Imdur.  Given history of previous GI bleed currently not on any antiplatelet therapy.  Pulmonary sarcoidosis Pulmonary hypertension -Patient follows via outpatient.  Reported recently completed course of prednisone 2 weeks ago.  Chronic diastolic heart failure with preserved EF 55 to 60% - Continue hydralazine, Coreg and Imdur.   -Holding Aldactone, Entresto in the setting of acute kidney injury.   -Holding Jardiance in the setting of pancreatitis.   Anemia of chronic disease -Stable H&H 15.5 and 48.  Continue to monitor  Hepatic steatosis -Stable hepatic function panel  DVT prophylaxis:  SQ Heparin Code Status:  Full Code Diet: Clear liquid diet>> will advance diet as patient tolerates and hyperglycemia results Family Communication:  Currently no family member at bedside Disposition Plan: Monitor for improvement of blood glucose levels.  Need to follow-up with GI recommendation regarding new diagnosis of pancreatic mass Consults: Gastroenterology Admission status:   Inpatient, Step Down Unit  Severity of Illness: The appropriate patient status for this patient is INPATIENT. Inpatient status is judged to be reasonable and necessary in order to provide the required intensity of service to ensure the patient's safety. The patient's presenting symptoms, physical exam findings, and initial radiographic and laboratory data in the context of their chronic comorbidities is felt to place them at high risk for further clinical deterioration. Furthermore, it is not anticipated that the patient will be medically stable for discharge from the hospital within 2 midnights of admission.   * I certify that at the point of admission it is my clinical judgment that the patient will require inpatient hospital care spanning beyond 2 midnights from the point of admission due to high intensity of service, high risk for further deterioration and high frequency of surveillance required.Marland Kitchen    Tereasa Coop, MD [facility]  How to contact the [facility] Attending or Consulting provider 7A - 7P or covering provider during after hours 7P -7A, for this patient.  Check the care team in  [facility] and look for a) attending/consulting [facility] provider listed and b) the [facility] team listed Log into [email or web address] and use [facility] universal password to access. If you do not have the password, please contact the hospital operator. Locate the [facility] provider you are looking for under [facility] and page to a number that you can be directly reached. If you still have difficulty reaching the provider, please page the [facility] (Director on Call) for the Hospitalists listed on amion for assistance.  10/09/2023, 4:46 AM

## 2023-10-09 NOTE — ED Notes (Signed)
 [employer]

## 2023-10-09 NOTE — Progress Notes (Signed)
  Progress Note   Patient: Charles Krueger UJW:[id number] DOB: 25-Oct-1955 DOA: 10/08/2023     0 DOS: the patient was seen and examined on 10/09/2023   Brief hospital course: 68 y.o. male with medical history significant of CAD, heart diastolic heart failure, essential hypertension, COPD, DM type II, GERD, essential hypertension, OSA, pulmonary sarcoidosis, pulmonary hypertension, diverticulosis, CKD stage IIIb, hepatic cirrhosis and chronic anemia referred from [place] to the ED for the management of acute pancreatitis and hyperglycemia.   Assessment and Plan:  Acute pancreatitis  -CT scan showing evidence of pancreatitis, history of pancreatitis past.  Aggressive IV fluid hydration, pain medications, currently NPO.    Pancreatic mass - CT noting lobulated mass at the pancreatic head with some calcifications.  Patient denies known history of pancreatic mass.  GI consult pending.  Diabetes mellitus with hyperglycemia - Glucose greater than 500 on presentation,.  Showing marked improvement after IV fluid hydration, insulin sliding scale.  Continue insulin sliding scale.  Hold Jardiance.  COPD/obstructive sleep apnea/pulmonary sarcoidosis - Follows with pulmonology outpatient setting.  Resume inhaler/nebulizers.  DuoNebs as needed.  HFpEF - Does not appear to be in acute treatment however will be cautious given above IV fluid hydration/resuscitation.  Acute kidney injury - BUN/creatinine elevated however with unknown baseline.  Will monitor urine output and recheck BMP in AM.     Subjective: Patient resting comfortably this morning, still admits to some epigastric abdominal pain.  Had episode of nausea but with no vomiting.  Denies any fever, shortness of breath, chest pain, vomiting.  Physical Exam: Vitals:   10/09/23 0830 10/09/23 0845 10/09/23 1130 10/09/23 1130  BP: (!) 165/96  (!) 169/101   Pulse: 85  72   Resp: (!) 30 (!) 26 15   Temp:    98.2 F (36.8 C)  TempSrc:    Oral  SpO2: 94%   94%   Weight:      Height:       GENERAL:  Alert, pleasant, mild acute distress  HEENT:  EOMI CARDIOVASCULAR:  RRR, no murmurs appreciated RESPIRATORY:  Clear to auscultation, no wheezing, rales, or rhonchi GASTROINTESTINAL:  Soft, epigastric tenderness EXTREMITIES:  No LE edema bilaterally NEURO:  No new focal deficits appreciated SKIN:  No rashes noted PSYCH:  Appropriate mood and affect   Data Reviewed:  There are no new results to review at this time.  Family Communication: None at bedside  Disposition: Status is: Inpatient Remains inpatient appropriate because: Acute pancreatitis  Planned Discharge Destination: Home    Time spent: 36 minutes  Author: Jodeane Mulligan, DO 10/09/2023 1:17 PM  For on call review [email or web address].

## 2023-10-09 NOTE — Inpatient Diabetes Management (Signed)
 Inpatient Diabetes Program Recommendations  AACE/ADA: New Consensus Statement on Inpatient Glycemic Control (2015)  Target Ranges:  Prepandial:   less than 140 mg/dL      Peak postprandial:   less than 180 mg/dL (1-2 hours)      Critically ill patients:  140 - 180 mg/dL   Lab Results  Component Value Date   GLUCAP 157 (H) 10/09/2023   HGBA1C 5.7 (H) 08/10/2023    Review of Glycemic Control  Latest Reference Range & Units 10/08/23 15:59  Glucose 70 - 99 mg/dL 782 (HH)  (HH): Data is critically high  Latest Reference Range & Units 10/09/23 02:34 10/09/23 04:25 10/09/23 08:19 10/09/23 11:50  Glucose-Capillary 70 - 99 mg/dL 956 (H) 213 (H) 086 (H) 157 (H)  (H): Data is abnormally high  Diabetes history: DM2 Outpatient Diabetes medications: Jardiance 10 mg QD Current orders for Inpatient glycemic control: Novolog 0-6 units Q4H  Met with patient at bedside in ED.  Confirmed above home DM medication.  He tells me he was on Prednisone 1 week ago for 3 days.  He believes this is why he became hyperglycemic.  He does not check his BG at home; he has a glucometer.  He states his sugars have always been good until this episode.  He is current with his PCP.    Will continue to follow while inpatient.  Thank you, Hays Lipschutz, MSN, CDCES Diabetes Coordinator Inpatient Diabetes Program [phone] (team pager from 8a-5p)

## 2023-10-10 DIAGNOSIS — N189 Chronic kidney disease, unspecified: Secondary | ICD-10-CM | POA: Diagnosis not present

## 2023-10-10 DIAGNOSIS — N179 Acute kidney failure, unspecified: Secondary | ICD-10-CM | POA: Diagnosis not present

## 2023-10-10 DIAGNOSIS — K8689 Other specified diseases of pancreas: Secondary | ICD-10-CM | POA: Diagnosis not present

## 2023-10-10 DIAGNOSIS — K858 Other acute pancreatitis without necrosis or infection: Secondary | ICD-10-CM | POA: Diagnosis not present

## 2023-10-10 LAB — GLUCOSE, CAPILLARY
Glucose-Capillary: 110 mg/dL — ABNORMAL HIGH (ref 70–99)
Glucose-Capillary: 136 mg/dL — ABNORMAL HIGH (ref 70–99)
Glucose-Capillary: 138 mg/dL — ABNORMAL HIGH (ref 70–99)
Glucose-Capillary: 138 mg/dL — ABNORMAL HIGH (ref 70–99)
Glucose-Capillary: 159 mg/dL — ABNORMAL HIGH (ref 70–99)
Glucose-Capillary: 167 mg/dL — ABNORMAL HIGH (ref 70–99)
Glucose-Capillary: 205 mg/dL — ABNORMAL HIGH (ref 70–99)

## 2023-10-10 LAB — COMPREHENSIVE METABOLIC PANEL WITH GFR
ALT: 9 U/L (ref 0–44)
AST: 11 U/L — ABNORMAL LOW (ref 15–41)
Albumin: 2.7 g/dL — ABNORMAL LOW (ref 3.5–5.0)
Alkaline Phosphatase: 126 U/L (ref 38–126)
Anion gap: 16 — ABNORMAL HIGH (ref 5–15)
BUN: 17 mg/dL (ref 8–23)
CO2: 14 mmol/L — ABNORMAL LOW (ref 22–32)
Calcium: 10 mg/dL (ref 8.9–10.3)
Chloride: 107 mmol/L (ref 98–111)
Creatinine, Ser: 1.81 mg/dL — ABNORMAL HIGH (ref 0.61–1.24)
GFR, Estimated: 41 mL/min — ABNORMAL LOW (ref >60)
Glucose, Bld: 138 mg/dL — ABNORMAL HIGH (ref 70–99)
Potassium: 4 mmol/L (ref 3.5–5.1)
Sodium: 137 mmol/L (ref 135–145)
Total Bilirubin: 0.7 mg/dL (ref 0.0–1.2)
Total Protein: 5.7 g/dL — ABNORMAL LOW (ref 6.5–8.1)

## 2023-10-10 LAB — LIPASE, BLOOD: Lipase: 369 U/L — ABNORMAL HIGH (ref 11–51)

## 2023-10-10 LAB — CBC
HCT: 44.3 % (ref 39.0–52.0)
Hemoglobin: 14 g/dL (ref 13.0–17.0)
MCH: 27.9 pg (ref 26.0–34.0)
MCHC: 31.6 g/dL (ref 30.0–36.0)
MCV: 88.2 fL (ref 80.0–100.0)
Platelets: 139 10*3/uL — ABNORMAL LOW (ref 150–400)
RBC: 5.02 MIL/uL (ref 4.22–5.81)
RDW: 21.5 % — ABNORMAL HIGH (ref 11.5–15.5)
WBC: 6.5 10*3/uL (ref 4.0–10.5)
nRBC: 0 % (ref 0.0–0.2)

## 2023-10-10 LAB — TRIGLYCERIDES: Triglycerides: 130 mg/dL (ref <150)

## 2023-10-10 MED ORDER — BISACODYL 10 MG RE SUPP
10.0000 mg | Freq: Once | RECTAL | Status: AC
  Administered 2023-10-10: 10 mg via RECTAL
  Filled 2023-10-10: qty 1

## 2023-10-10 MED ORDER — SODIUM BICARBONATE 8.4 % IV SOLN
50.0000 meq | Freq: Once | INTRAVENOUS | Status: AC
  Administered 2023-10-10: 50 meq via INTRAVENOUS
  Filled 2023-10-10: qty 50
  Filled 2023-10-10: qty 100

## 2023-10-10 NOTE — Progress Notes (Signed)
  Progress Note   Patient: Charles Krueger XBJ:[id number] DOB: 1956-03-09 DOA: 10/08/2023     1 DOS: the patient was seen and examined on 10/10/2023   Brief hospital course: 68 y.o. male with medical history significant of CAD, heart diastolic heart failure, essential hypertension, COPD, DM type II, GERD, essential hypertension, OSA, pulmonary sarcoidosis, pulmonary hypertension, diverticulosis, CKD stage IIIb, hepatic cirrhosis and chronic anemia referred from [place] to the ED for the management of acute pancreatitis and hyperglycemia.   Assessment and Plan:  Acute pancreatitis  -CT scan showing evidence of pancreatitis, history of pancreatitis past.  Aggressive IV fluid hydration, pain medications.  Patient not actively drinking alcohol, etiology thought to be from underlying lobulated irregular pancreatic mass.  GI following closely.  Pancreatic mass - CT noting lobulated mass at the pancreatic head with some calcifications.  Patient denies known history of pancreatic mass.  GI following closely.  Pending MRCP with contrast for further evaluation.  CA 19-9 pending as well.  Diabetes mellitus with hyperglycemia - Glucose greater than 500 on presentation,.  Showing marked improvement after IV fluid hydration, insulin sliding scale.  Continue insulin sliding scale.  Hold Jardiance.  COPD/obstructive sleep apnea/pulmonary sarcoidosis - Follows with pulmonology outpatient setting.  Resume inhaler/nebulizers.  DuoNebs as needed.  HFpEF - Does not appear to be in acute treatment however will be cautious given above IV fluid hydration/resuscitation.  Acute kidney injury - BUN/creatinine elevated on presentation, showing improvement after IV fluid hydration.  Noted metabolic acidosis this morning, CO2 14.  Likely combination of ketosis and underlying renal dysfunction.  Sodium bicarb x 1 amp.  Will monitor urine output and recheck BMP in AM.     Subjective: Patient resting comfortably this morning,  improved epigastric abdominal pain.  Denies fever, chills, chest pain, nausea, vomiting.  Physical Exam: Vitals:   10/10/23 0735 10/10/23 0829 10/10/23 0933 10/10/23 1204  BP: (!) 145/84  (!) 145/84 124/67  Pulse: 75   78  Resp: 16   16  Temp: 98.8 F (37.1 C)   98.1 F (36.7 C)  TempSrc: Oral   Oral  SpO2: 92% 94%  93%  Weight:      Height:       GENERAL:  Alert, pleasant, mild acute distress  HEENT:  EOMI CARDIOVASCULAR:  RRR, no murmurs appreciated RESPIRATORY:  Clear to auscultation, no wheezing, rales, or rhonchi GASTROINTESTINAL:  Soft, epigastric tenderness EXTREMITIES:  No LE edema bilaterally NEURO:  No new focal deficits appreciated SKIN:  No rashes noted PSYCH:  Appropriate mood and affect   Data Reviewed:  There are no new results to review at this time.  Family Communication: None at bedside  Disposition: Status is: Inpatient Remains inpatient appropriate because: Acute pancreatitis  Planned Discharge Destination: Home    Time spent: 34 minutes  Author: Jodeane Mulligan, DO 10/10/2023 1:46 PM  For on call review [email or web address].

## 2023-10-10 NOTE — Progress Notes (Signed)
 [facility] Progress Note  CC:   Acute pancreatitis   Subjective: No nausea or vomiting.  He is tolerating clear liquid diet.  He is hungry and wishes to advance his diet.  He passed a small nonbloody bowel movement earlier this morning.  No chest pain or shortness of breath.  No family at the bedside.  Objective:  Vital signs in last 24 hours: Temp:  [97.7 F (36.5 C)-99.2 F (37.3 C)] 98.1 F (36.7 C) (04/17 1204) Pulse Rate:  [64-82] 78 (04/17 1204) Resp:  [12-22] 16 (04/17 1204) BP: (123-158)/(67-104) 124/67 (04/17 1204) SpO2:  [92 %-96 %] 93 % (04/17 1204) Weight:  [68.3 kg] 68.3 kg (04/16 1941) Last BM Date : 10/06/23 (PTA) General: Alert 68 year old male in no acute distress. Heart: Rate and rhythm, no murmurs. Pulm: Sounds clear throughout. Abdomen: Soft, mildly protuberant.  Epigastric tenderness without rebound or guarding.  Positive bowel sounds to all 4 quadrants.  No palpable mass. Extremities:  No edema. Neurologic:  Alert and  oriented x 4. Grossly normal neurologically. Psych:  Alert and cooperative. Normal mood and affect.  Intake/Output from previous day: 04/16 0701 - 04/17 0700 In: 318.7 [I.V.:318.7] Out: 200 [Urine:200] Intake/Output this shift: No intake/output data recorded.  Lab Results: Recent Labs    10/08/23 1559 10/09/23 0313 10/09/23 0604 10/10/23 0446  WBC 6.3  --  6.3 6.5  HGB 15.6 15.3 14.2 14.0  HCT 48.7 45.0 43.9 44.3  PLT 156  --  132* 139*   BMET Recent Labs    10/08/23 1559 10/09/23 0313 10/09/23 0604 10/10/23 0446  NA 133* 139 138 137  K 4.5 4.3 4.0 4.0  CL 102  --  108 107  CO2 19*  --  21* 14*  GLUCOSE 518*  --  213* 138*  BUN 26*  --  23 17  CREATININE 2.35*  --  1.89* 1.81*  CALCIUM 10.4*  --  10.0 10.0   LFT Recent Labs    10/10/23 0446  PROT 5.7*  ALBUMIN 2.7*  AST 11*  ALT 9  ALKPHOS 126  BILITOT 0.7   PT/INR No results for input(s): "LABPROT", "INR" in the last 72  hours. Hepatitis Panel No results for input(s): "HEPBSAG", "HCVAB", "HEPAIGM", "HEPBIGM" in the last 72 hours.  MR ABDOMEN MRCP W WO CONTAST Result Date: 10/10/2023 CLINICAL DATA:  Acute on chronic pancreatitis, outside CT reports pancreatic head mass EXAM: MRI ABDOMEN WITHOUT AND WITH CONTRAST (INCLUDING MRCP) TECHNIQUE: Multiplanar multisequence MR imaging of the abdomen was performed both before and after the administration of intravenous contrast. Heavily T2-weighted images of the biliary and pancreatic ducts were obtained, and three-dimensional MRCP images were rendered by post processing. CONTRAST:  7mL GADAVIST GADOBUTROL 1 MMOL/ML IV SOLN COMPARISON:  07/14/2022 FINDINGS: Lower chest: No acute abnormality.  Cardiomegaly. Hepatobiliary: Hepatic steatosis. Profound signal inversion on in and opposed phase imaging as well as intrinsic T2 hypointensity of the liver parenchyma, consistent with iron deposition. No gallstones, gallbladder wall thickening, or biliary dilatation. Pancreas: Diffusely expansile, edematous appearance of the pancreas with adjacent peripancreatic and retroperitoneal fluid signal fat stranding. Diffuse multi cystic change throughout the pancreas, largest, internally layering cystic lesion in the inferior pancreatic uncinate measuring 2.2 x 1.5 cm (series 7, image 7). The pancreatic duct is diffusely dilated distal to the pancreatic neck, measuring up to 1.0 cm in caliber (series 7, image 17). Spleen: Normal size. Profound signal inversion on in and opposed phase imaging consistent with iron deposition. Adrenals/Urinary  Tract: Adrenal glands are unremarkable. Multiple fluid signal and intrinsically T1 hyperintense hemorrhagic or proteinaceous renal cortical cysts, for which no further follow-up or characterization is required. Kidneys are otherwise normal, without obvious renal calculi, solid lesion, or hydronephrosis. Stomach/Bowel: Stomach is within normal limits. No evidence of  bowel wall thickening, distention, or inflammatory changes. Vascular/Lymphatic: Aortic atherosclerosis. The central superior mesenteric vein and portal confluence are markedly narrowed in the vicinity of the pancreatic head (series 1003, image 66), with variceal collateralization about the abdomen. No enlarged abdominal lymph nodes. Other: No abdominal wall hernia or abnormality. Trace perihepatic ascites. Musculoskeletal: No acute or significant osseous findings. IMPRESSION: 1. Diffusely expansile, edematous appearance of the pancreas with adjacent peripancreatic and retroperitoneal fluid signal fat stranding. Diffuse multicystic change throughout the pancreas, largest, internally layering cystic lesion in the inferior pancreatic uncinate measuring 2.2 x 1.5 cm. The pancreatic duct is diffusely dilated distal to the pancreatic neck, measuring up to 1.0 cm in caliber. Findings are consistent with acute on chronic pancreatitis without evidence of focal diffusion restriction or enhancement abnormality to suggest underlying mass. Although new compared to prior examination dated 07/14/2022, acuity of cystic lesions is indeterminate (i.e. Acute pancreatic fluid collection versus pseudocyst). 2. The central superior mesenteric vein and portal confluence are markedly narrowed in the vicinity of the pancreatic head, with variceal collateralization about the abdomen. 3. Hepatic steatosis as well as hepatic and splenic iron deposition. 4. Cardiomegaly. Aortic Atherosclerosis (ICD10-I70.0). Electronically Signed   By: Fredricka Jenny M.D.   On: 10/10/2023 06:46   MR 3D Recon At Scanner Result Date: 10/10/2023 CLINICAL DATA:  Acute on chronic pancreatitis, outside CT reports pancreatic head mass EXAM: MRI ABDOMEN WITHOUT AND WITH CONTRAST (INCLUDING MRCP) TECHNIQUE: Multiplanar multisequence MR imaging of the abdomen was performed both before and after the administration of intravenous contrast. Heavily T2-weighted images of  the biliary and pancreatic ducts were obtained, and three-dimensional MRCP images were rendered by post processing. CONTRAST:  7mL GADAVIST GADOBUTROL 1 MMOL/ML IV SOLN COMPARISON:  07/14/2022 FINDINGS: Lower chest: No acute abnormality.  Cardiomegaly. Hepatobiliary: Hepatic steatosis. Profound signal inversion on in and opposed phase imaging as well as intrinsic T2 hypointensity of the liver parenchyma, consistent with iron deposition. No gallstones, gallbladder wall thickening, or biliary dilatation. Pancreas: Diffusely expansile, edematous appearance of the pancreas with adjacent peripancreatic and retroperitoneal fluid signal fat stranding. Diffuse multi cystic change throughout the pancreas, largest, internally layering cystic lesion in the inferior pancreatic uncinate measuring 2.2 x 1.5 cm (series 7, image 7). The pancreatic duct is diffusely dilated distal to the pancreatic neck, measuring up to 1.0 cm in caliber (series 7, image 17). Spleen: Normal size. Profound signal inversion on in and opposed phase imaging consistent with iron deposition. Adrenals/Urinary Tract: Adrenal glands are unremarkable. Multiple fluid signal and intrinsically T1 hyperintense hemorrhagic or proteinaceous renal cortical cysts, for which no further follow-up or characterization is required. Kidneys are otherwise normal, without obvious renal calculi, solid lesion, or hydronephrosis. Stomach/Bowel: Stomach is within normal limits. No evidence of bowel wall thickening, distention, or inflammatory changes. Vascular/Lymphatic: Aortic atherosclerosis. The central superior mesenteric vein and portal confluence are markedly narrowed in the vicinity of the pancreatic head (series 1003, image 66), with variceal collateralization about the abdomen. No enlarged abdominal lymph nodes. Other: No abdominal wall hernia or abnormality. Trace perihepatic ascites. Musculoskeletal: No acute or significant osseous findings. IMPRESSION: 1. Diffusely  expansile, edematous appearance of the pancreas with adjacent peripancreatic and retroperitoneal fluid signal fat stranding. Diffuse multicystic  change throughout the pancreas, largest, internally layering cystic lesion in the inferior pancreatic uncinate measuring 2.2 x 1.5 cm. The pancreatic duct is diffusely dilated distal to the pancreatic neck, measuring up to 1.0 cm in caliber. Findings are consistent with acute on chronic pancreatitis without evidence of focal diffusion restriction or enhancement abnormality to suggest underlying mass. Although new compared to prior examination dated 07/14/2022, acuity of cystic lesions is indeterminate (i.e. Acute pancreatic fluid collection versus pseudocyst). 2. The central superior mesenteric vein and portal confluence are markedly narrowed in the vicinity of the pancreatic head, with variceal collateralization about the abdomen. 3. Hepatic steatosis as well as hepatic and splenic iron deposition. 4. Cardiomegaly. Aortic Atherosclerosis (ICD10-I70.0). Electronically Signed   By: Jearld Lesch M.D.   On: 10/10/2023 06:46   Patient Profile:  Charles Krueger is a 68 y.o. male with a past medical history of hypertension, coronary artery disease s/p MI, HFrecEF, COPD, OSA uses CPAP, pulmonary sarcoidosis, diabetes mellitus type 2, CKD stage 3b, chronic anemia, acute on chronic pancreatitis, pancreatic duct stricture, diverticulosis, colon polyps and lower GI bleed status post polypectomy 01/2023 admitted 10/08/2023 with upper abdominal pain and recurrent acute on chronic pancreatitis.   Assessment / Plan:  68 year old male with a history of acute on chronic pancreatitis with prior PD stricture per EUS 06/2021. 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 CTAP done at the [place] on 10/08/2023 showed a lobulated irregular pancreas with a 3 cm soft tissue density in the head  of the pancreas with dilated pancreatic duct measuring 9 mm.  Normal LFTs.  Lipase 710.  Calcium 10.4 -> 10.  Triglycerides 130.  CA-19 pending.  MRI/MRCP today identified acute on chronic pancreatitis with multicystic change throughout the pancreas with a cystic lesion in the uncinate process measuring 2.2 x 1.5 cm, PD diffusely dilated to the pancreatic neck measuring up to 1 cm in caliber and the central superior mesenteric vein and portal confluence are markedly narrowed in the vicinity of the pancreatic head with variceal collateralization. -MRI/MRCP reviewed by Dr. Meridee Score, recommended an outpatient EUS in 4 to 6 weeks at [facility] vs with Dr. Meridee Score. However, if his CA19-9 is significantly elevated to consider an earlier EUS. Patient agreeable to have a future EUS done by whichever specialist who can perform it as soon as possible.  -Advance to full liquid diet, if not tolerated switch back to clear liquids  -Fluids and pain management per the hospitalist -Pantoprazole 40 mg p.o. daily -Await further recommendations per Dr. Leonides Schanz   AKI on CKD stage IIIb, Cr 2.35 -> 1.81.   Chronic iron deficiency anemia.  Previously hospitalized with acute on chronic anemia 07/2023, with Hg 6.4, FOBT negative, transfused 2 units of PRBCs and he received IV iron during that admission. Since then, remains on oral iron.  Admission Hg 15.6 on 10/08/2023, today Hg 14.   History of colon polyps, most recent colonoscopy at the [facility] 01/2023 required hospitalization for post polypectomy GI bleed - Follow[facility] for continued colon polyp surveillance colonoscopies   Hepatic steatosis with hepatic and splenic iron deposition per MRI/MRCP.   CAD   COPD/OSA/pulmonary sarcoidosis   DM type II      Principal Problem:   Acute pancreatitis Active Problems:   COPD (chronic obstructive pulmonary disease) (HCC)   Obstructive sleep apnea   Non-insulin dependent type 2 diabetes mellitus  (  HCC)   CAD (coronary artery disease)   Pulmonary sarcoidosis (HCC)   Chronic diastolic CHF (congestive heart failure) (HCC)   Chronic anemia   Hyperglycemic crisis due to diabetes mellitus (HCC)   Pancreatic mass   Acute kidney injury superimposed on chronic kidney disease (HCC)   Essential hypertension   History of CAD (coronary artery disease)   GERD (gastroesophageal reflux disease)   Diverticulosis   Hepatic steatosis   Abnormal CT scan     LOS: 1 day   Tory Freiberg  10/10/2023, 2:53 PM

## 2023-10-11 DIAGNOSIS — K858 Other acute pancreatitis without necrosis or infection: Secondary | ICD-10-CM | POA: Diagnosis not present

## 2023-10-11 LAB — COMPREHENSIVE METABOLIC PANEL WITH GFR
ALT: 9 U/L (ref 0–44)
AST: 12 U/L — ABNORMAL LOW (ref 15–41)
Albumin: 2.9 g/dL — ABNORMAL LOW (ref 3.5–5.0)
Alkaline Phosphatase: 125 U/L (ref 38–126)
Anion gap: 13 (ref 5–15)
BUN: 14 mg/dL (ref 8–23)
CO2: 15 mmol/L — ABNORMAL LOW (ref 22–32)
Calcium: 10 mg/dL (ref 8.9–10.3)
Chloride: 110 mmol/L (ref 98–111)
Creatinine, Ser: 1.83 mg/dL — ABNORMAL HIGH (ref 0.61–1.24)
GFR, Estimated: 40 mL/min — ABNORMAL LOW (ref >60)
Glucose, Bld: 131 mg/dL — ABNORMAL HIGH (ref 70–99)
Potassium: 4.2 mmol/L (ref 3.5–5.1)
Sodium: 138 mmol/L (ref 135–145)
Total Bilirubin: 0.9 mg/dL (ref 0.0–1.2)
Total Protein: 6 g/dL — ABNORMAL LOW (ref 6.5–8.1)

## 2023-10-11 LAB — GLUCOSE, CAPILLARY
Glucose-Capillary: 134 mg/dL — ABNORMAL HIGH (ref 70–99)
Glucose-Capillary: 197 mg/dL — ABNORMAL HIGH (ref 70–99)

## 2023-10-11 LAB — CBC
HCT: 45.6 % (ref 39.0–52.0)
Hemoglobin: 13.7 g/dL (ref 13.0–17.0)
MCH: 28.1 pg (ref 26.0–34.0)
MCHC: 30 g/dL (ref 30.0–36.0)
MCV: 93.6 fL (ref 80.0–100.0)
Platelets: 146 10*3/uL — ABNORMAL LOW (ref 150–400)
RBC: 4.87 MIL/uL (ref 4.22–5.81)
RDW: 21.6 % — ABNORMAL HIGH (ref 11.5–15.5)
WBC: 5.6 10*3/uL (ref 4.0–10.5)
nRBC: 0 % (ref 0.0–0.2)

## 2023-10-11 LAB — MAGNESIUM: Magnesium: 1.7 mg/dL (ref 1.7–2.4)

## 2023-10-11 LAB — CANCER ANTIGEN 19-9: CA 19-9: 904 U/mL — ABNORMAL HIGH (ref 0–35)

## 2023-10-11 LAB — PROTIME-INR
INR: 1.1 (ref 0.8–1.2)
Prothrombin Time: 14.2 s (ref 11.4–15.2)

## 2023-10-11 NOTE — Hospital Course (Addendum)
   68 y.o. male with medical history significant of CAD, heart diastolic heart failure, essential hypertension, COPD, DM type II, GERD, essential hypertension, OSA, pulmonary sarcoidosis, pulmonary hypertension, diverticulosis, CKD stage IIIb baseline 1.8-2, hepatic cirrhosis and chronic anemia referred from [place] to the ED for the management of acute pancreatitis and hyperglycemia.

## 2023-10-11 NOTE — Discharge Summary (Signed)
 Physician Discharge Summary   Patient: Charles Krueger MRN: [id number] DOB: [birth year]  Admit date:     10/08/2023  Discharge date: 10/11/23  Discharge Physician: Ephriam Hashimoto   PCP: [facility]     Recommendations at discharge:  Follow up with [facility] Dr. Rowena Copa Follow up with PCP at [facility] Dr. Rowena Copa: Please see MRCP result below and repeat EUS as appropriate  PCP: Please check BMP in 1 week to re-evaluate Bicarb and refer to Nephrology as appropriate Follow up CA19-9     Discharge Diagnoses: Principal Problem:   Acute on chronic pancreatitis Active Problems:   Hyperglycemic crisis due to diabetes mellitus (HCC)   Pancreatic masslike change on MRI   CKD IIIb   AKI ruled out   COPD (chronic obstructive pulmonary disease) (HCC)   Obstructive sleep apnea   Non-insulin  dependent type 2 diabetes mellitus (HCC)   CAD (coronary artery disease)   Pulmonary sarcoidosis (HCC)   Chronic diastolic CHF (congestive heart failure) (HCC)   Chronic anemia   Essential hypertension   History of CAD (coronary artery disease)   GERD (gastroesophageal reflux disease)   Diverticulosis   Hepatic steatosis   Non-gap metabolic acidosis, chronic    Hospital Course: 68 y.o. male with medical history significant of CAD, heart diastolic heart failure, essential hypertension, COPD, DM type II, GERD, essential hypertension, OSA, pulmonary sarcoidosis, pulmonary hypertension, diverticulosis, CKD stage IIIb baseline 1.8-2, hepatic cirrhosis and chronic anemia referred from [place] to the ED for the management of acute pancreatitis and hyperglycemia.     Acute on chronic pancreatitis Patient was admitted with abdominal pain and nausea.  MRCP showed findings consistent with acute on chronic pancreatitis.  Treated with IV fluids, bowel rest, and symptoms resolved.  GI consulted.  Able to advance diet and tolerated well.  Asymptomatic, discharged home with GI  follow-up.   Possible pancreatic mass This is longstanding, previous CA 19-9 was elevated at 60.  Follows with Dr. Rowena Copa from [facility].  Repeat CA 19-9 pending at the time of discharge.  Recommend follow-up with GI for repeat EUS.   Diabetes with hyperglycemia Presenting with glucose greater than 500.  Started on IV fluids, subcutaneous insulin  glucoses resolved.   Pulmonary sarcoid COPD Sleep apnea Asymptomatic, no evidence of flare  Chronic diastolic congestive heart failure Appeared euvolemic.               The [place]  Controlled Substances Registry was reviewed for this patient prior to discharge.  Consultants: Gastroenterology Procedures performed: MRI abdomen   Disposition: Home Diet recommendation:  Cardiac diet  DISCHARGE MEDICATION: Allergies as of 10/11/2023       Reactions   Zestril [lisinopril] Other (See Comments)   Acute renal failure        Medication List     TAKE these medications    albuterol  108 (90 Base) MCG/ACT inhaler Commonly known as: VENTOLIN  HFA Inhale 2 puffs into the lungs every 6 (six) hours as needed for wheezing or shortness of breath.   atorvastatin  40 MG tablet Commonly known as: LIPITOR Take 1 tablet (40 mg total) by mouth daily. What changed: when to take this   Azelastine HCl 137 MCG/SPRAY Soln Inhale 2 sprays into the lungs in the morning and at bedtime.   calcium -vitamin D  500-5 MG-MCG tablet Commonly known as: OSCAL WITH D Take 1 tablet by mouth daily.   carvedilol  25 MG tablet Commonly known as: COREG  Take 25 mg by mouth 2 (two) times  daily with a meal.   cetirizine 10 MG tablet Commonly known as: ZYRTEC Take 10 mg by mouth daily as needed for allergies.   cholecalciferol 25 MCG (1000 UNIT) tablet Commonly known as: VITAMIN D3 Take 1,000 Units by mouth daily.   docusate calcium  240 MG capsule Commonly known as: SURFAK Take 240 mg by mouth daily.   empagliflozin  10 MG Tabs  tablet Commonly known as: JARDIANCE  Take 1 tablet (10 mg total) by mouth daily.   Entresto  97-103 MG Generic drug: sacubitril -valsartan  Take 1 tablet by mouth 2 times daily.   fluticasone  50 MCG/ACT nasal spray Commonly known as: FLONASE  Place 1 spray into both nostrils 2 (two) times daily.   folic acid  1 MG tablet Commonly known as: FOLVITE  Take 1 tablet by mouth daily.   hydrALAZINE  50 MG tablet Commonly known as: APRESOLINE  Take 1.5 tablets (75 mg total) by mouth 3 (three) times daily. What changed: how much to take   Iron  325 (65 Fe) MG Tabs Take 1 tablet (325 mg total) by mouth daily with breakfast.   isosorbide  mononitrate 30 MG 24 hr tablet Commonly known as: IMDUR  Take 1 tablet (30 mg total) by mouth daily.   ketotifen 0.025 % ophthalmic solution Commonly known as: ZADITOR Place 1 drop into both eyes 2 (two) times daily as needed (itchy eyes).   Mometasone Furoate 100 MCG/ACT Aero Inhale 2 puffs into the lungs as needed (Wheezing; SOB).   montelukast  10 MG tablet Commonly known as: SINGULAIR  Take 10 mg by mouth daily.   multivitamin with minerals Tabs tablet Take 1 tablet by mouth daily.   sodium chloride  0.65 % Soln nasal spray Commonly known as: OCEAN Place 2 sprays into both nostrils as needed for congestion.   spironolactone  25 MG tablet Commonly known as: ALDACTONE  Take 1 tablet (25 mg total) by mouth daily.   Stiolto Respimat 2.5-2.5 MCG/ACT Aers Generic drug: Tiotropium Bromide-Olodaterol Inhale 2 puffs into the lungs in the morning.        Follow-up Information     Aniceto Kern, MD. Schedule an appointment as soon as possible for a visit.   Specialty: Internal Medicine Why: Call for an appointment; they should contact you, if no word by next week, call them at [phone] (option 3 then option 1) Contact information: [address] [place] [address] [phone]         [facility] Schedule an  appointment as soon as possible for a visit in 1 week(s).   Contact information: [address] [facility] [place] [address] [phone]                 Discharge Instructions     Discharge instructions   Complete by: As directed    **IMPORTANT DISCHARGE INSTRUCTIONS**   From Dr. Darlyn Eke: You were admitted for acute on chronic pancreatitis.  "Acute" pancreatitis means that it has flared up (causing pain, vomiting, nausea) Eat a bland diet (A "bland diet" means avoiding fatty foods, greasy foods, ALL fried foods, all fatty meat)  Avoid alcohol and STRICTLY avoid smoking  Go see your primary doctor as soon as able Ask them to check your labs again and ask them for a referral to Nephrology (kidney doctor) for monitoring your "bicarb"  Lastly, you need regular follow up for your pancreas Go see the GI doctors at [facility] (see below, Dr. Amil Balding) for an appointment I have called them and they will reach out to you for follow up  If you haven't heard from them for 1 week, you should call the number listed  You may resume all your normal home medicines   Increase activity slowly   Complete by: As directed        Discharge Exam: Filed Weights   10/08/23 1526 10/09/23 1941  Weight: 77.1 kg 68.3 kg    General: Pt is alert, awake, not in acute distress Cardiovascular: RRR, nl S1-S2, no murmurs appreciated.   No LE edema.   Respiratory: Normal respiratory rate and rhythm.  CTAB without rales or wheezes. Abdominal: Abdomen soft and non-tender.  No distension or HSM.   Neuro/Psych: Strength symmetric in upper and lower extremities.  Judgment and insight appear normal.   Condition at discharge: good  The results of significant diagnostics from this hospitalization (including imaging, microbiology, ancillary and laboratory) are listed below for reference.   Imaging Studies: MR ABDOMEN MRCP W WO CONTAST Result Date: 10/10/2023 CLINICAL DATA:  Acute on  chronic pancreatitis, outside CT reports pancreatic head mass EXAM: MRI ABDOMEN WITHOUT AND WITH CONTRAST (INCLUDING MRCP) TECHNIQUE: Multiplanar multisequence MR imaging of the abdomen was performed both before and after the administration of intravenous contrast. Heavily T2-weighted images of the biliary and pancreatic ducts were obtained, and three-dimensional MRCP images were rendered by post processing. CONTRAST:  7mL GADAVIST  GADOBUTROL  1 MMOL/ML IV SOLN COMPARISON:  07/14/2022 FINDINGS: Lower chest: No acute abnormality.  Cardiomegaly. Hepatobiliary: Hepatic steatosis. Profound signal inversion on in and opposed phase imaging as well as intrinsic T2 hypointensity of the liver parenchyma, consistent with iron  deposition. No gallstones, gallbladder wall thickening, or biliary dilatation. Pancreas: Diffusely expansile, edematous appearance of the pancreas with adjacent peripancreatic and retroperitoneal fluid signal fat stranding. Diffuse multi cystic change throughout the pancreas, largest, internally layering cystic lesion in the inferior pancreatic uncinate measuring 2.2 x 1.5 cm (series 7, image 7). The pancreatic duct is diffusely dilated distal to the pancreatic neck, measuring up to 1.0 cm in caliber (series 7, image 17). Spleen: Normal size. Profound signal inversion on in and opposed phase imaging consistent with iron  deposition. Adrenals/Urinary Tract: Adrenal glands are unremarkable. Multiple fluid signal and intrinsically T1 hyperintense hemorrhagic or proteinaceous renal cortical cysts, for which no further follow-up or characterization is required. Kidneys are otherwise normal, without obvious renal calculi, solid lesion, or hydronephrosis. Stomach/Bowel: Stomach is within normal limits. No evidence of bowel wall thickening, distention, or inflammatory changes. Vascular/Lymphatic: Aortic atherosclerosis. The central superior mesenteric vein and portal confluence are markedly narrowed in the  vicinity of the pancreatic head (series 1003, image 66), with variceal collateralization about the abdomen. No enlarged abdominal lymph nodes. Other: No abdominal wall hernia or abnormality. Trace perihepatic ascites. Musculoskeletal: No acute or significant osseous findings. IMPRESSION: 1. Diffusely expansile, edematous appearance of the pancreas with adjacent peripancreatic and retroperitoneal fluid signal fat stranding. Diffuse multicystic change throughout the pancreas, largest, internally layering cystic lesion in the inferior pancreatic uncinate measuring 2.2 x 1.5 cm. The pancreatic duct is diffusely dilated distal to the pancreatic neck, measuring up to 1.0 cm in caliber. Findings are consistent with acute on chronic pancreatitis without evidence of focal diffusion restriction or enhancement abnormality to suggest underlying mass. Although new compared to prior examination dated 07/14/2022, acuity of cystic lesions is indeterminate (i.e. Acute pancreatic fluid collection versus pseudocyst). 2. The central superior mesenteric vein and portal confluence are markedly narrowed in the vicinity of the pancreatic head, with variceal collateralization about the abdomen. 3. Hepatic steatosis as well as hepatic and  splenic iron  deposition. 4. Cardiomegaly. Aortic Atherosclerosis (ICD10-I70.0). Electronically Signed   By: Fredricka Jenny M.D.   On: 10/10/2023 06:46   MR 3D Recon At Scanner Result Date: 10/10/2023 CLINICAL DATA:  Acute on chronic pancreatitis, outside CT reports pancreatic head mass EXAM: MRI ABDOMEN WITHOUT AND WITH CONTRAST (INCLUDING MRCP) TECHNIQUE: Multiplanar multisequence MR imaging of the abdomen was performed both before and after the administration of intravenous contrast. Heavily T2-weighted images of the biliary and pancreatic ducts were obtained, and three-dimensional MRCP images were rendered by post processing. CONTRAST:  7mL GADAVIST  GADOBUTROL  1 MMOL/ML IV SOLN COMPARISON:  07/14/2022  FINDINGS: Lower chest: No acute abnormality.  Cardiomegaly. Hepatobiliary: Hepatic steatosis. Profound signal inversion on in and opposed phase imaging as well as intrinsic T2 hypointensity of the liver parenchyma, consistent with iron  deposition. No gallstones, gallbladder wall thickening, or biliary dilatation. Pancreas: Diffusely expansile, edematous appearance of the pancreas with adjacent peripancreatic and retroperitoneal fluid signal fat stranding. Diffuse multi cystic change throughout the pancreas, largest, internally layering cystic lesion in the inferior pancreatic uncinate measuring 2.2 x 1.5 cm (series 7, image 7). The pancreatic duct is diffusely dilated distal to the pancreatic neck, measuring up to 1.0 cm in caliber (series 7, image 17). Spleen: Normal size. Profound signal inversion on in and opposed phase imaging consistent with iron  deposition. Adrenals/Urinary Tract: Adrenal glands are unremarkable. Multiple fluid signal and intrinsically T1 hyperintense hemorrhagic or proteinaceous renal cortical cysts, for which no further follow-up or characterization is required. Kidneys are otherwise normal, without obvious renal calculi, solid lesion, or hydronephrosis. Stomach/Bowel: Stomach is within normal limits. No evidence of bowel wall thickening, distention, or inflammatory changes. Vascular/Lymphatic: Aortic atherosclerosis. The central superior mesenteric vein and portal confluence are markedly narrowed in the vicinity of the pancreatic head (series 1003, image 66), with variceal collateralization about the abdomen. No enlarged abdominal lymph nodes. Other: No abdominal wall hernia or abnormality. Trace perihepatic ascites. Musculoskeletal: No acute or significant osseous findings. IMPRESSION: 1. Diffusely expansile, edematous appearance of the pancreas with adjacent peripancreatic and retroperitoneal fluid signal fat stranding. Diffuse multicystic change throughout the pancreas, largest,  internally layering cystic lesion in the inferior pancreatic uncinate measuring 2.2 x 1.5 cm. The pancreatic duct is diffusely dilated distal to the pancreatic neck, measuring up to 1.0 cm in caliber. Findings are consistent with acute on chronic pancreatitis without evidence of focal diffusion restriction or enhancement abnormality to suggest underlying mass. Although new compared to prior examination dated 07/14/2022, acuity of cystic lesions is indeterminate (i.e. Acute pancreatic fluid collection versus pseudocyst). 2. The central superior mesenteric vein and portal confluence are markedly narrowed in the vicinity of the pancreatic head, with variceal collateralization about the abdomen. 3. Hepatic steatosis as well as hepatic and splenic iron  deposition. 4. Cardiomegaly. Aortic Atherosclerosis (ICD10-I70.0). Electronically Signed   By: Fredricka Jenny M.D.   On: 10/10/2023 06:46    Microbiology: Results for orders placed or performed during the hospital encounter of 04/18/22  Resp Panel by RT-PCR (Flu A&B, Covid)     Status: None   Collection Time: 04/19/22  2:21 PM   Specimen: Nasal Swab  Result Value Ref Range Status   SARS Coronavirus 2 by RT PCR NEGATIVE NEGATIVE Final    Comment: (NOTE) SARS-CoV-2 target nucleic acids are NOT DETECTED.  The SARS-CoV-2 RNA is generally detectable in upper respiratory specimens during the acute phase of infection. The lowest concentration of SARS-CoV-2 viral copies this assay can detect is 138 copies/mL. A negative result does  not preclude SARS-Cov-2 infection and should not be used as the sole basis for treatment or other patient management decisions. A negative result may occur with  improper specimen collection/handling, submission of specimen other than nasopharyngeal swab, presence of viral mutation(s) within the areas targeted by this assay, and inadequate number of viral copies(<138 copies/mL). A negative result must be combined with clinical  observations, patient history, and epidemiological information. The expected result is Negative.  Fact Sheet for Patients:  BloggerCourse.com  Fact Sheet for Healthcare Providers:  SeriousBroker.it  This test is no t yet approved or cleared by the [place]  FDA and  has been authorized for detection and/or diagnosis of SARS-CoV-2 by FDA under an Emergency Use Authorization (EUA). This EUA will remain  in effect (meaning this test can be used) for the duration of the COVID-19 declaration under Section 564(b)(1) of the Act, 21 U.S.C.section 360bbb-3(b)(1), unless the authorization is terminated  or revoked sooner.       Influenza A by PCR NEGATIVE NEGATIVE Final   Influenza B by PCR NEGATIVE NEGATIVE Final    Comment: (NOTE) The Xpert Xpress SARS-CoV-2/FLU/RSV plus assay is intended as an aid in the diagnosis of influenza from Nasopharyngeal swab specimens and should not be used as a sole basis for treatment. Nasal washings and aspirates are unacceptable for Xpert Xpress SARS-CoV-2/FLU/RSV testing.  Fact Sheet for Patients: BloggerCourse.com  Fact Sheet for Healthcare Providers: SeriousBroker.it  This test is not yet approved or cleared by the [place]  FDA and has been authorized for detection and/or diagnosis of SARS-CoV-2 by FDA under an Emergency Use Authorization (EUA). This EUA will remain in effect (meaning this test can be used) for the duration of the COVID-19 declaration under Section 564(b)(1) of the Act, 21 U.S.C. section 360bbb-3(b)(1), unless the authorization is terminated or revoked.  Performed at [facility], [address] [place], [place] [address]   Culture, blood (routine x 2)     Status: None   Collection Time: 04/19/22  3:05 PM   Specimen: BLOOD  Result Value Ref Range Status   Specimen Description BLOOD RIGHT ANTECUBITAL  Final    Special Requests   Final    BOTTLES DRAWN AEROBIC AND ANAEROBIC Blood Culture results may not be optimal due to an inadequate volume of blood received in culture bottles   Culture   Final    NO GROWTH 5 DAYS Performed at [facility], [address] [place], [place] [address]    Report Status 04/24/2022 FINAL  Final  Culture, blood (routine x 2)     Status: None   Collection Time: 04/19/22  7:40 PM   Specimen: BLOOD  Result Value Ref Range Status   Specimen Description BLOOD RIGHT ANTECUBITAL  Final   Special Requests   Final    BOTTLES DRAWN AEROBIC AND ANAEROBIC Blood Culture adequate volume   Culture   Final    NO GROWTH 5 DAYS Performed at [facility], [address], [place], [place] [address]    Report Status 04/24/2022 FINAL  Final    Labs: CBC: Recent Labs  Lab 10/08/23 1559 10/09/23 0313 10/09/23 0604 10/10/23 0446 10/11/23 0427  WBC 6.3  --  6.3 6.5 5.6  NEUTROABS 4.3  --   --   --   --   HGB 15.6 15.3 14.2 14.0 13.7  HCT 48.7 45.0 43.9 44.3 45.6  MCV 87.0  --  86.8 88.2 93.6  PLT 156  --  132* 139* 146*   Basic Metabolic Panel: Recent Labs  Lab 10/08/23 1559 10/09/23 0313 10/09/23 0604 10/10/23 0446 10/11/23 0427  NA 133* 139 138 137 138  K 4.5 4.3 4.0 4.0 4.2  CL 102  --  108 107 110  CO2 19*  --  21* 14* 15*  GLUCOSE 518*  --  213* 138* 131*  BUN 26*  --  23 17 14   CREATININE 2.35*  --  1.89* 1.81* 1.83*  CALCIUM  10.4*  --  10.0 10.0 10.0  MG  --   --   --   --  1.7   Liver Function Tests: Recent Labs  Lab 10/08/23 1559 10/09/23 0604 10/10/23 0446 10/11/23 0427  AST 11* 10* 11* 12*  ALT 12 11 9 9   ALKPHOS 156* 129* 126 125  BILITOT 0.5 0.3 0.7 0.9  PROT 6.9 5.7* 5.7* 6.0*  ALBUMIN  3.5 2.8* 2.7* 2.9*   CBG: Recent Labs  Lab 10/10/23 1600 10/10/23 2031 10/10/23 2332 10/11/23 0341 10/11/23 0745  GLUCAP 138* 205* 138* 134* 197*    Discharge time spent: approximately 45 minutes spent on discharge counseling,  evaluation of patient on day of discharge, and coordination of discharge planning with nursing, social work, pharmacy and case management  Signed: Ephriam Hashimoto, MD [facility] 10/11/2023

## 2023-10-11 NOTE — Care Management Important Message (Signed)
 Important Message  Patient Details  Name: Charles Krueger MRN: [id number] Date of Birth: Apr 07, 1956   Important Message Given:  Yes - Medicare IM     Janith Melnick 10/11/2023, 10:29 AM

## 2023-10-16 ENCOUNTER — Other Ambulatory Visit: Payer: Self-pay

## 2023-10-16 DIAGNOSIS — K8689 Other specified diseases of pancreas: Secondary | ICD-10-CM

## 2023-10-16 DIAGNOSIS — R978 Other abnormal tumor markers: Secondary | ICD-10-CM

## 2023-10-17 ENCOUNTER — Encounter (HOSPITAL_COMMUNITY): Payer: Self-pay | Admitting: Gastroenterology

## 2023-10-17 NOTE — Progress Notes (Signed)
 Patient: Charles Krueger    [facility]ologist-[facility]sees [facility] due to see next month Pulmonologist- Dr Diania Fortes   EKG-02/11/23 Echo-05/12/21 Cath-12/14/20 Stress-n/a ICD/PM-n/a Blood thinner-n/a GLP-1-n/a   Hx: CHF,MI,COPD,DM,OSA, Sarcoidosis. Most of care thru [place], reports last time saw cardiologist was about year ago, due to see next month. Last visit with pulm was in August 2024.Patient recently d/c hospital 4/18 for pancreatitis. He reported no cardiac or breathing issues, no equipment use. Anesthesia Review- Yes- sounds pretty urgent and so I think he's ok to proceed .

## 2023-10-24 ENCOUNTER — Encounter (HOSPITAL_COMMUNITY)
Admission: RE | Disposition: A | Discharge status: Home / Self Care | Payer: Self-pay | Source: Home / Self Care | Attending: Gastroenterology

## 2023-10-24 ENCOUNTER — Ambulatory Visit (HOSPITAL_COMMUNITY): Admitting: Anesthesiology

## 2023-10-24 ENCOUNTER — Ambulatory Visit (HOSPITAL_COMMUNITY)
Admission: RE | Admit: 2023-10-24 | Discharge: 2023-10-24 | Disposition: A | Discharge status: Home / Self Care | Attending: Gastroenterology | Admitting: Gastroenterology

## 2023-10-24 ENCOUNTER — Encounter (HOSPITAL_COMMUNITY): Payer: Self-pay | Admitting: Gastroenterology

## 2023-10-24 ENCOUNTER — Other Ambulatory Visit: Payer: Self-pay

## 2023-10-24 ENCOUNTER — Ambulatory Visit (HOSPITAL_BASED_OUTPATIENT_CLINIC_OR_DEPARTMENT_OTHER): Admitting: Anesthesiology

## 2023-10-24 DIAGNOSIS — Z7984 Long term (current) use of oral hypoglycemic drugs: Secondary | ICD-10-CM | POA: Diagnosis not present

## 2023-10-24 DIAGNOSIS — K219 Gastro-esophageal reflux disease without esophagitis: Secondary | ICD-10-CM | POA: Insufficient documentation

## 2023-10-24 DIAGNOSIS — F17210 Nicotine dependence, cigarettes, uncomplicated: Secondary | ICD-10-CM | POA: Diagnosis not present

## 2023-10-24 DIAGNOSIS — F17290 Nicotine dependence, other tobacco product, uncomplicated: Secondary | ICD-10-CM | POA: Diagnosis not present

## 2023-10-24 DIAGNOSIS — I5030 Unspecified diastolic (congestive) heart failure: Secondary | ICD-10-CM | POA: Insufficient documentation

## 2023-10-24 DIAGNOSIS — R590 Localized enlarged lymph nodes: Secondary | ICD-10-CM | POA: Insufficient documentation

## 2023-10-24 DIAGNOSIS — N1832 Chronic kidney disease, stage 3b: Secondary | ICD-10-CM

## 2023-10-24 DIAGNOSIS — I5032 Chronic diastolic (congestive) heart failure: Secondary | ICD-10-CM | POA: Diagnosis not present

## 2023-10-24 DIAGNOSIS — G4730 Sleep apnea, unspecified: Secondary | ICD-10-CM | POA: Insufficient documentation

## 2023-10-24 DIAGNOSIS — K2289 Other specified disease of esophagus: Secondary | ICD-10-CM | POA: Insufficient documentation

## 2023-10-24 DIAGNOSIS — I130 Hypertensive heart and chronic kidney disease with heart failure and stage 1 through stage 4 chronic kidney disease, or unspecified chronic kidney disease: Secondary | ICD-10-CM | POA: Diagnosis not present

## 2023-10-24 DIAGNOSIS — K3189 Other diseases of stomach and duodenum: Secondary | ICD-10-CM | POA: Diagnosis not present

## 2023-10-24 DIAGNOSIS — J449 Chronic obstructive pulmonary disease, unspecified: Secondary | ICD-10-CM | POA: Diagnosis not present

## 2023-10-24 DIAGNOSIS — K2980 Duodenitis without bleeding: Secondary | ICD-10-CM | POA: Insufficient documentation

## 2023-10-24 DIAGNOSIS — K2990 Gastroduodenitis, unspecified, without bleeding: Secondary | ICD-10-CM

## 2023-10-24 DIAGNOSIS — K862 Cyst of pancreas: Secondary | ICD-10-CM | POA: Insufficient documentation

## 2023-10-24 DIAGNOSIS — I110 Hypertensive heart disease with heart failure: Secondary | ICD-10-CM | POA: Diagnosis not present

## 2023-10-24 DIAGNOSIS — G4733 Obstructive sleep apnea (adult) (pediatric): Secondary | ICD-10-CM | POA: Diagnosis not present

## 2023-10-24 DIAGNOSIS — K8689 Other specified diseases of pancreas: Secondary | ICD-10-CM | POA: Diagnosis not present

## 2023-10-24 DIAGNOSIS — R978 Other abnormal tumor markers: Secondary | ICD-10-CM

## 2023-10-24 DIAGNOSIS — K2970 Gastritis, unspecified, without bleeding: Secondary | ICD-10-CM | POA: Diagnosis not present

## 2023-10-24 DIAGNOSIS — I2510 Atherosclerotic heart disease of native coronary artery without angina pectoris: Secondary | ICD-10-CM | POA: Diagnosis not present

## 2023-10-24 DIAGNOSIS — K209 Esophagitis, unspecified without bleeding: Secondary | ICD-10-CM | POA: Insufficient documentation

## 2023-10-24 DIAGNOSIS — K861 Other chronic pancreatitis: Secondary | ICD-10-CM | POA: Diagnosis not present

## 2023-10-24 DIAGNOSIS — Z7951 Long term (current) use of inhaled steroids: Secondary | ICD-10-CM | POA: Diagnosis not present

## 2023-10-24 DIAGNOSIS — I252 Old myocardial infarction: Secondary | ICD-10-CM | POA: Diagnosis not present

## 2023-10-24 DIAGNOSIS — R933 Abnormal findings on diagnostic imaging of other parts of digestive tract: Secondary | ICD-10-CM | POA: Insufficient documentation

## 2023-10-24 DIAGNOSIS — I272 Pulmonary hypertension, unspecified: Secondary | ICD-10-CM | POA: Diagnosis not present

## 2023-10-24 DIAGNOSIS — E119 Type 2 diabetes mellitus without complications: Secondary | ICD-10-CM | POA: Insufficient documentation

## 2023-10-24 DIAGNOSIS — K869 Disease of pancreas, unspecified: Secondary | ICD-10-CM | POA: Diagnosis present

## 2023-10-24 HISTORY — PX: FINE NEEDLE ASPIRATION: SHX6590

## 2023-10-24 HISTORY — PX: ESOPHAGOGASTRODUODENOSCOPY: SHX5428

## 2023-10-24 HISTORY — PX: EUS: SHX5427

## 2023-10-24 LAB — GLUCOSE, CAPILLARY: Glucose-Capillary: 146 mg/dL — ABNORMAL HIGH (ref 70–99)

## 2023-10-24 SURGERY — ULTRASOUND, UPPER GI TRACT, ENDOSCOPIC
Anesthesia: Monitor Anesthesia Care

## 2023-10-24 MED ORDER — CIPROFLOXACIN IN D5W 400 MG/200ML IV SOLN
INTRAVENOUS | Status: DC | PRN
  Administered 2023-10-24: 400 mg via INTRAVENOUS

## 2023-10-24 MED ORDER — FENTANYL CITRATE (PF) 100 MCG/2ML IJ SOLN: 25.0000 ug | INTRAMUSCULAR | Status: DC | PRN

## 2023-10-24 MED ORDER — CIPROFLOXACIN IN D5W 400 MG/200ML IV SOLN
INTRAVENOUS | Status: AC
  Filled 2023-10-24: qty 200

## 2023-10-24 MED ORDER — PROPOFOL 500 MG/50ML IV EMUL
INTRAVENOUS | Status: DC | PRN
  Administered 2023-10-24: 20 mg via INTRAVENOUS
  Administered 2023-10-24: 150 ug/kg/min via INTRAVENOUS

## 2023-10-24 MED ORDER — OXYCODONE HCL 5 MG PO TABS: 5.0000 mg | ORAL_TABLET | Freq: Once | ORAL | Status: DC | PRN

## 2023-10-24 MED ORDER — LIDOCAINE 2% (20 MG/ML) 5 ML SYRINGE
INTRAMUSCULAR | Status: DC | PRN
  Administered 2023-10-24: 60 mg via INTRAVENOUS

## 2023-10-24 MED ORDER — MIDAZOLAM HCL 2 MG/2ML IJ SOLN: 0.5000 mg | Freq: Once | INTRAMUSCULAR | Status: DC | PRN

## 2023-10-24 MED ORDER — SODIUM CHLORIDE 0.9 % IV SOLN: INTRAVENOUS | Status: DC

## 2023-10-24 MED ORDER — PANTOPRAZOLE SODIUM 40 MG PO TBEC: 40.0000 mg | DELAYED_RELEASE_TABLET | Freq: Every day | ORAL | 12 refills | Status: AC

## 2023-10-24 MED ORDER — CIPROFLOXACIN HCL 500 MG PO TABS: 500.0000 mg | ORAL_TABLET | Freq: Two times a day (BID) | ORAL | 0 refills | Status: AC

## 2023-10-24 MED ORDER — OXYCODONE HCL 5 MG/5ML PO SOLN
5.0000 mg | Freq: Once | ORAL | Status: DC | PRN
  Filled 2023-10-24: qty 5

## 2023-10-24 MED ORDER — PROPOFOL 1000 MG/100ML IV EMUL
INTRAVENOUS | Status: AC
  Filled 2023-10-24: qty 100

## 2023-10-24 NOTE — Anesthesia Procedure Notes (Addendum)
 Procedure Name: MAC Date/Time: 10/24/2023 9:13 AM  Performed by: Marshall Skeeter, CRNAPre-anesthesia Checklist: Patient identified, Emergency Drugs available, Suction available, Patient being monitored and Timeout performed Patient Re-evaluated:Patient Re-evaluated prior to induction Oxygen Delivery Method: Simple face mask Placement Confirmation: positive ETCO2 Dental Injury: Teeth and Oropharynx as per pre-operative assessment

## 2023-10-24 NOTE — Transfer of Care (Signed)
 Immediate Anesthesia Transfer of Care Note  Patient: Charles Krueger  Procedure(s) Performed: ULTRASOUND, UPPER GI TRACT, ENDOSCOPIC  Patient Location: PACU and Endoscopy Unit  Anesthesia Type:MAC  Level of Consciousness: drowsy, cooperative  Airway & Oxygen Therapy: Patient Spontanous Breathing and Patient connected to face mask oxygen  Post-op Assessment: Report given to RN, Post -op Vital signs reviewed and stable, and Patient moving all extremities  Post vital signs: Reviewed and stable  Last Vitals:  Vitals Value Taken Time  BP    Temp    Pulse    Resp    SpO2      Last Pain:  Vitals:   10/24/23 0736  TempSrc: Temporal  PainSc: 0-No pain         Complications: No notable events documented.

## 2023-10-24 NOTE — H&P (Signed)
 GASTROENTEROLOGY PROCEDURE H&P NOTE   Primary Care Physician: [facility], [facility]  HPI: Charles Krueger is a 68 y.o. male who presents for EGD/EUS for evaluation of abnormal pancreas, PD dilation, elevated CA19-9 to rule out underlying malignancy.  Past Medical History:  Diagnosis Date   COPD (chronic obstructive pulmonary disease) (HCC)    Diabetes mellitus without complication (HCC)    GERD (gastroesophageal reflux disease)    HTN (hypertension)    Myocardial infarction (HCC)    silent Mi several years ago   OSA on CPAP    Pneumonia    pt reports was mentioned in 12/23 at [facility] visit   Sarcoidosis    Past Surgical History:  Procedure Laterality Date   COLONOSCOPY N/A 02/12/2023   Procedure: COLONOSCOPY;  Surgeon: Albertina Hugger, MD;  Location: [facility] ENDOSCOPY;  Service: Gastroenterology;  Laterality: N/A;   HEMOSTASIS CLIP PLACEMENT  02/12/2023   Procedure: HEMOSTASIS CLIP PLACEMENT;  Surgeon: Albertina Hugger, MD;  Location: [facility] ENDOSCOPY;  Service: Gastroenterology;;   INGUINAL HERNIA REPAIR Right 09/03/2022   Procedure: OPEN RIGHT INGUINAL HERNIA REPAIR WITH MESH;  Surgeon: Melvenia Stabs, MD;  Location: [facility] ORS;  Service: General;  Laterality: Right;   RIGHT/LEFT HEART CATH AND CORONARY ANGIOGRAPHY N/A 12/14/2020   Procedure: RIGHT/LEFT HEART CATH AND CORONARY ANGIOGRAPHY;  Surgeon: Arleen Lacer, MD;  Location: [facility] INVASIVE CV LAB;  Service: Cardiovascular;  Laterality: N/A;   Current Facility-Administered Medications  Medication Dose Route Frequency Provider Last Rate Last Admin   0.9 %  sodium chloride  infusion   Intravenous Continuous Mansouraty, Albino Alu., MD 20 mL/hr at 10/24/23 0739 New Bag at 10/24/23 0739    Current Facility-Administered Medications:    0.9 %  sodium chloride  infusion, , Intravenous, Continuous, Mansouraty, Albino Alu., MD, Last Rate: 20 mL/hr at 10/24/23 0739, New Bag at 10/24/23 0739 Allergies  Allergen Reactions   Zestril  [Lisinopril] Other (See Comments)    Acute renal failure   Family History  Problem Relation Age of Onset   CAD Mother    Social History   Socioeconomic History   Marital status: Married    Spouse name: Not on file   Number of children: Not on file   Years of education: Not on file   Highest education level: Not on file  Occupational History   Not on file  Tobacco Use   Smoking status: Some Days    Types: Cigars    Start date: 2002   Smokeless tobacco: Never   Tobacco comments:    3-4 Cigars per week  Vaping Use   Vaping status: Never Used  Substance and Sexual Activity   Alcohol use: Yes    Alcohol/week: 6.0 - 7.0 standard drinks of alcohol    Types: 6 - 7 Shots of liquor per week    Comment: occAs   Drug use: No   Sexual activity: Not on file  Other Topics Concern   Not on file  Social History Narrative   Not on file   Social Drivers of Health   Financial Resource Strain: Not on file  Food Insecurity: No Food Insecurity (10/09/2023)   Hunger Vital Sign    Worried About Running Out of Food in the Last Year: Never true    Ran Out of Food in the Last Year: Never true  Transportation Needs: No Transportation Needs (10/09/2023)   PRAPARE - Administrator, Civil Service (Medical): No    Lack of Transportation (  Non-Medical): No  Physical Activity: Not on file  Stress: No Stress Concern Present (10/13/2020)   Received from [facility], [facility] of Occupational Health - Occupational Stress Questionnaire    Feeling of Stress : Not at all  Social Connections: Socially Integrated (10/09/2023)   Social Connection and Isolation Panel [NHANES]    Frequency of Communication with Friends and Family: Never    Frequency of Social Gatherings with Friends and Family: Three times a week    Attends Religious Services: 1 to 4 times per year    Active Member of Clubs or Organizations: Yes    Attends Banker Meetings: 1 to 4 times per  year    Marital Status: Married  Catering manager Violence: Not At Risk (10/09/2023)   Humiliation, Afraid, Rape, and Kick questionnaire    Fear of Current or Ex-Partner: No    Emotionally Abused: No    Physically Abused: No    Sexually Abused: No    Physical Exam: Today's Vitals   10/24/23 0736  BP: 134/70  Pulse: 84  Resp: 20  Temp: (!) 97.3 F (36.3 C)  TempSrc: Temporal  SpO2: 97%  Weight: 68 kg  Height: 5\' 9"  (1.753 m)  PainSc: 0-No pain   Body mass index is 22.15 kg/m. GEN: NAD EYE: Sclerae anicteric ENT: MMM CV: Non-tachycardic GI: Soft, NT/ND NEURO:  Alert & Oriented x 3  Lab Results: No results for input(s): "WBC", "HGB", "HCT", "PLT" in the last 72 hours. BMET No results for input(s): "NA", "K", "CL", "CO2", "GLUCOSE", "BUN", "CREATININE", "CALCIUM " in the last 72 hours. LFT No results for input(s): "PROT", "ALBUMIN ", "AST", "ALT", "ALKPHOS", "BILITOT", "BILIDIR", "IBILI" in the last 72 hours. PT/INR No results for input(s): "LABPROT", "INR" in the last 72 hours.   Impression / Plan: This is a 68 y.o.male who presents for EGD/EUS for evaluation of abnormal pancreas, PD dilation, elevated CA19-9 to rule out underlying malignancy.  The risks of an EUS including intestinal perforation, bleeding, infection, aspiration, and medication effects were discussed as was the possibility it may not give a definitive diagnosis if a biopsy is performed.  When a biopsy of the pancreas is done as part of the EUS, there is an additional risk of pancreatitis at the rate of about 1-2%.  It was explained that procedure related pancreatitis is typically mild, although it can be severe and even life threatening, which is why we do not perform random pancreatic biopsies and only biopsy a lesion/area we feel is concerning enough to warrant the risk.   The risks and benefits of endoscopic evaluation/treatment were discussed with the patient and/or family; these include but are not  limited to the risk of perforation, infection, bleeding, missed lesions, lack of diagnosis, severe illness requiring hospitalization, as well as anesthesia and sedation related illnesses.  The patient's history has been reviewed, patient examined, no change in status, and deemed stable for procedure.  The patient and/or family is agreeable to proceed.    Yong Henle, MD [facility] Office # [phone]

## 2023-10-24 NOTE — Op Note (Signed)
 [facility] Patient Name: Charles Krueger Procedure Date: 10/24/2023 MRN: [id number] Attending MD: Yong Henle , MD, [id number] Date of Birth: [birth year] CSN: [id number] Age: 68 Admit Type: Outpatient Procedure:                Upper EUS Indications:              Pancreatic cyst on MRCP, Dilated pancreatic duct on                            MRCP, Suspected mass in pancreas on MRCP, Elevated                            CA 19-9 Providers:                Yong Henle, MD, Bradley Caffey, Marvelyn Slim, Technician Referring MD:             Yong Henle, MD Medicines:                Monitored Anesthesia Care Complications:            No immediate complications. Estimated Blood Loss:     Estimated blood loss was minimal. Procedure:                Pre-Anesthesia Assessment:                           - Prior to the procedure, a History and Physical                            was performed, and patient medications and                            allergies were reviewed. The patient's tolerance of                            previous anesthesia was also reviewed. The risks                            and benefits of the procedure and the sedation                            options and risks were discussed with the patient.                            All questions were answered, and informed consent                            was obtained. Prior Anticoagulants: The patient has                            taken no anticoagulant or antiplatelet agents. ASA  Grade Assessment: III - A patient with severe                            systemic disease. After reviewing the risks and                            benefits, the patient was deemed in satisfactory                            condition to undergo the procedure.                           After obtaining informed consent, the endoscope was                             passed under direct vision. Throughout the                            procedure, the patient's blood pressure, pulse, and                            oxygen saturations were monitored continuously. The                            [id number]-H190 ([id number]) Olympus endoscope was introduced                            through the mouth, and advanced to the second part                            of duodenum. The [id number]-[id number] ([id number]) Olympus                            duodenoscope was introduced through the mouth, and                            advanced to the area of papilla. The [id number]                            ([id number]) Olympus linear ultrasound scope was                            introduced through the mouth, and advanced to the                            duodenum for ultrasound examination from the                            stomach and duodenum. The upper EUS was                            accomplished without difficulty. The patient  tolerated the procedure. Scope In: Scope Out: Findings:      ENDOSCOPIC FINDING: :      On the right vocal cord, there appeared to be a small cystic appearing       lesion that requires additional follow-up.      LA Grade A (one or more mucosal breaks less than 5 mm, not extending       between tops of 2 mucosal folds) esophagitis with no bleeding was found       in the distal esophagus.      No other gross lesions were noted in the entire esophagus.      The Z-line was irregular and was found at the gastroesophageal junction.      A J-shaped deformity was found in the entire examined stomach.      Patchy moderate inflammation characterized by erosions and erythema was       found in the entire examined stomach. Biopsies were taken with a cold       forceps for histology and Helicobacter pylori testing.      Diffuse moderate inflammation characterized by congestion (edema),       erosions, friability and linear erosions was found in  the duodenal bulb,       in the first portion of the duodenum and in the second portion of the       duodenum. Biopsies were taken with a cold forceps for histology.      The major papilla was hidden under a hood with significant congested       mucosa, but was able to be visualized of the major papilla did not       appear to be adenomatous diffuse severe mucosal changes characterized by       congestion were found in the area of the papilla.      ENDOSONOGRAPHIC FINDING: :      Endosonographic imaging of the pancreas showed sonographic changes       indicative of severe chronic pancreatitis in the entire pancreas. The       parenchyma had hyperechoic foci with shadowing, lobularity with       honeycombing and hyperechoic strands. The pancreatic duct had       intraductal calculi, irregularity of contour, dilated side-branches,       duct dilation and a hyperechoic duct margin.      An anechoic lesion suggestive of a cyst was identified in the uncinate       process of the pancreas. It is not in obvious communication with the       pancreatic duct. The lesion measured 20 mm by 12 mm in maximal       cross-sectional diameter. There was a single compartment without septae.       The outer wall of the lesion was not seen. There was no associated mass.       There was internal debris within the fluid-filled cavity. Diagnostic       needle aspiration for fluid was performed. Color Doppler imaging was       utilized prior to needle puncture to confirm a lack of significant       vascular structures within the needle path. One pass was made with the       22 gauge needle using a transduodenal approach. A stylet was used. The       amount of fluid collected was 2 mL. The fluid was cloudy, serosanguinous  and watery. Sample(s) were sent for chemistry, amylase concentration,       cytology and CEA.      An irregular mass was identified in the pancreatic head/neck region,       where the  significant ductal dilation develops. The mass was hypoechoic       and characterized by shadowing. The mass measured 40 mm by 26 mm in       maximal cross-sectional diameter. The outer margins were irregular.       There was sonographic evidence suggesting invasion into the superior       mesenteric artery (manifested by abutment) and the splenoportal       confluence (manifested by interface loss less than 15 mm). An intact       interface was seen between the mass and the celiac trunk suggesting a       lack of invasion. The remainder of the pancreas was examined. The       endosonographic appearance of parenchyma and the upstream pancreatic       duct indicated duct dilation. Fine needle biopsy was performed. Color       Doppler imaging was utilized prior to needle puncture to confirm a lack       of significant vascular structures within the needle path. Six passes       were made with the 22 gauge Acquire biopsy needle using a transgastric       approach and using a transduodenal approach. A visible core of tissue       was obtained. Preliminary cytologic examination and touch preps were       performed. Final cytology results are pending.      A few enlarged lymph nodes were visualized in the peripancreatic region.       The largest measured 6 mm by 5 mm in maximal cross-sectional diameter.       The nodes were triangular, isoechoic and had well defined margins.      Endosonographic imaging in the visualized portion of the liver showed no       mass.      The celiac region was visualized. Impression:               EGD impression:                           - On right vocal cord, there appears to be a cystic                            lesion that requires additional evaluation.                           - LA Grade A esophagitis with no bleeding distally.                            No other gross lesions in the entire esophagus.                            Z-line irregular, at the  gastroesophageal junction.                           - J-shaped deformity in the entire stomach.  Gastritis. Biopsied.                           - Duodenitis. Biopsied.                           - Mucosal hood over major papilla with significant                            congestion. No fishmouth deformity or mucus                            extrusion.                           EUS impression:                           - Endosonographic imaging of the pancreas showed                            sonographic changes consistent with severe chronic                            pancreatitis. This significantly decreases the                            sensitivity of today's endoscopic ultrasound.                           - A cystic lesion was seen in the uncinate process                            of the pancreas. Fine needle aspiration for fluid                            performed.                           - A mass was identified in the pancreatic                            head/neck. Cytology results are pending. However,                            the endosonographic appearance as well as the                            elevated CA 19?"9 makes this suspicious for                            adenocarcinoma. This was staged T3 N0 Mx by                            endosonographic criteria. The staging applies if  malignancy is confirmed. Fine needle biopsy                            performed.                           - A few enlarged lymph nodes were visualized in the                            peripancreatic region. Tissue has not been                            obtained. However, the endosonographic appearance                            is suggestive of benign inflammatory changes. Moderate Sedation:      Not Applicable - Patient had care per Anesthesia. Recommendation:           - The patient will be observed post-procedure,                             until all discharge criteria are met.                           - Discharge patient to home.                           - Patient has a contact number available for                            emergencies. The signs and symptoms of potential                            delayed complications were discussed with the                            patient. Return to normal activities tomorrow.                            Written discharge instructions were provided to the                            patient.                           -Recommend ENT evaluation for vocal cord                            abnormality noted today.                           - Start PPI 40 mg once daily to heal esophagitis.                           - Low fat diet.                           -  Observe patient's clinical course.                           - Await cytology results and await path results.                           - Monitor for signs/symptoms of bleeding,                            perforation, and infection. If issues please call                            our number to get further assistance as needed.                           - If cytology is benign or nondiagnostic, we will                            consider repeat EUS here versus with the patient's                            team at [facility] (we performed EUS here since this                            was quicker for the patient to be able to come from                            a transportation standpoint otherwise would have                            allowed him to follow-up with his prior GI team).                           - The findings and recommendations were discussed                            with the patient.                           - The findings and recommendations were discussed                            with the patient's family. Procedure Code(s):        --- Professional ---                           [phone],  Esophagogastroduodenoscopy, flexible,                            transoral; with transendoscopic ultrasound-guided                            intramural or transmural fine needle  aspiration/biopsy(s), (includes endoscopic                            ultrasound examination limited to the esophagus,                            stomach or duodenum, and adjacent structures)                           43239, 59, Esophagogastroduodenoscopy, flexible,                            transoral; with biopsy, single or multiple Diagnosis Code(s):        --- Professional ---                           K20.90, Esophagitis, unspecified without bleeding                           R93.3, Abnormal findings on diagnostic imaging of                            other parts of digestive tract                           K22.89, Other specified disease of esophagus                           K31.89, Other diseases of stomach and duodenum                           K29.70, Gastritis, unspecified, without bleeding                           K29.80, Duodenitis without bleeding                           K86.2, Cyst of pancreas                           K86.89, Other specified diseases of pancreas                           R59.0, Localized enlarged lymph nodes                           R97.8, Other abnormal tumor markers CPT copyright 2022 American Medical Association. All rights reserved. The codes documented in this report are preliminary and upon coder review may  be revised to meet current compliance requirements. Yong Henle, MD 10/24/2023 10:39:14 AM Number of Addenda: 0

## 2023-10-24 NOTE — Discharge Instructions (Signed)

## 2023-10-24 NOTE — Anesthesia Postprocedure Evaluation (Signed)
 Anesthesia Post Note  Patient: Charles Krueger  Procedure(s) Performed: ULTRASOUND, UPPER GI TRACT, ENDOSCOPIC     Patient location during evaluation: PACU Anesthesia Type: MAC Level of consciousness: awake and alert, patient cooperative and oriented Pain management: pain level controlled Vital Signs Assessment: post-procedure vital signs reviewed and stable Respiratory status: spontaneous breathing, nonlabored ventilation and respiratory function stable Cardiovascular status: stable and blood pressure returned to baseline Postop Assessment: no apparent nausea or vomiting and able to ambulate Anesthetic complications: no   No notable events documented.  Last Vitals:  Vitals:   10/24/23 1040 10/24/23 1050  BP: 132/82 (!) 148/87  Pulse: 68 67  Resp: (!) 27 (!) 21  Temp:    SpO2: 93% 96%    Last Pain:  Vitals:   10/24/23 1050  TempSrc:   PainSc: 0-No pain                 Vada Swift,E. Marcelus Dubberly

## 2023-10-24 NOTE — Anesthesia Preprocedure Evaluation (Addendum)
 Anesthesia Evaluation  Patient identified by MRN, date of birth, ID band Patient awake    Reviewed: Allergy & Precautions, NPO status , Patient's Chart, lab work & pertinent test results, reviewed documented beta blocker date and time   History of Anesthesia Complications Negative for: history of anesthetic complications  Airway Mallampati: I  TM Distance: >3 FB Neck ROM: Full    Dental  (+) Edentulous Upper, Missing, Dental Advisory Given   Pulmonary sleep apnea (pt says he doesn't ue his CPAP) and Continuous Positive Airway Pressure Ventilation , COPD,  COPD inhaler, Current Smoker and Patient abstained from smoking.   breath sounds clear to auscultation       Cardiovascular hypertension, Pt. on medications and Pt. on home beta blockers pulmonary hypertension(-) angina + CAD (RCA disease: medically managed), + Past MI and +CHF (Entresto )   Rhythm:Regular Rate:Normal  '22 ECHO: EF 55 to 60%.  1. The LV has normal function, basal inferior akinesis. There is  mild LVH.  Grade I diastolic dysfunction (impaired relaxation).   2. RVF is normal. The right ventricular size is normal. Tricuspid regurgitation signal is inadequate for assessing PA pressure.   3. Left atrial size was mildly dilated.   4. The aortic valve is tricuspid. Aortic valve regurgitation is not visualized. No aortic stenosis is present.   5. The mitral valve is normal in structure. Trivial mitral valve regurgitation. No evidence of mitral stenosis.     Neuro/Psych negative neurological ROS     GI/Hepatic Neg liver ROS,GERD  Controlled,,Acute pancreatitis   Endo/Other  diabetes (glu 146), Oral Hypoglycemic Agents    Renal/GU Renal InsufficiencyRenal disease     Musculoskeletal   Abdominal   Peds  Hematology Hb 13.7, plt 146k   Anesthesia Other Findings   Reproductive/Obstetrics                             Anesthesia  Physical Anesthesia Plan  ASA: 3  Anesthesia Plan: MAC   Post-op Pain Management: Minimal or no pain anticipated   Induction:   PONV Risk Score and Plan: 0 and Treatment may vary due to age or medical condition  Airway Management Planned: Natural Airway and Simple Face Mask  Additional Equipment:   Intra-op Plan:   Post-operative Plan:   Informed Consent: I have reviewed the patients History and Physical, chart, labs and discussed the procedure including the risks, benefits and alternatives for the proposed anesthesia with the patient or authorized representative who has indicated his/her understanding and acceptance.     Dental advisory given  Plan Discussed with: CRNA and Surgeon  Anesthesia Plan Comments:         Anesthesia Quick Evaluation

## 2023-10-25 LAB — CYTOLOGY - NON PAP

## 2023-10-25 LAB — SURGICAL PATHOLOGY

## 2023-10-27 ENCOUNTER — Encounter (HOSPITAL_COMMUNITY): Payer: Self-pay | Admitting: Gastroenterology

## 2023-10-28 ENCOUNTER — Encounter: Payer: Self-pay | Admitting: Gastroenterology

## 2023-11-01 ENCOUNTER — Telehealth: Payer: Self-pay | Admitting: Gastroenterology

## 2023-11-01 ENCOUNTER — Encounter: Payer: Self-pay | Admitting: Gastroenterology

## 2023-11-01 NOTE — Telephone Encounter (Signed)
 Called back at number and had to leave VM again. I'll try again this afternoon and if not, then next week. GM

## 2023-11-01 NOTE — Telephone Encounter (Signed)
 Good Afternoon Dr Brice Campi   Patient returning call. Please advise at [phone].

## 2023-11-01 NOTE — Telephone Encounter (Signed)
 Tried to call patient today to go over results of recent EUS. Had to leave a voicemail. I will try again before end of today. If patient calls back, hopefully he will leave best phone number so that I may reach him before end of business today.   Yong Henle, MD [facility] Office # [phone]

## 2023-11-02 NOTE — Progress Notes (Signed)
 [facility] pancreatic fluid analysis  CEA 73 ng/mL Amylase 21,636 units/L Glucose <4.3 mg/dL  PancraGEN testing is pending.  Concern that an underlying mucinous cyst could be at play, but the potential of a pseudocyst should be considered as well from the uncinate cyst that was noted at EUS.  We will follow-up the PancraGEN testing genetic testing.  Further documentation on patient's cytology specimen as to the plan of action for him in follow-up.   Yong Henle, MD [facility] Advanced [facility]oscopy Office # [phone]

## 2023-11-02 NOTE — Telephone Encounter (Signed)
 I was able to reach the patient on 5/9 PM. A result note on the patient's cytology will be placed in regards to the conversation and plans of action. Thank you. GM

## 2023-11-04 ENCOUNTER — Other Ambulatory Visit: Payer: Self-pay

## 2023-11-04 DIAGNOSIS — R978 Other abnormal tumor markers: Secondary | ICD-10-CM

## 2023-11-04 DIAGNOSIS — K861 Other chronic pancreatitis: Secondary | ICD-10-CM

## 2023-11-04 DIAGNOSIS — K8689 Other specified diseases of pancreas: Secondary | ICD-10-CM

## 2023-11-07 ENCOUNTER — Telehealth: Payer: Self-pay | Admitting: Gastroenterology

## 2023-11-07 NOTE — Telephone Encounter (Signed)
 Noted will await further communication.  Pt is scheduled with Dr Brice Campi for EUS

## 2023-11-08 ENCOUNTER — Other Ambulatory Visit (INDEPENDENT_AMBULATORY_CARE_PROVIDER_SITE_OTHER)

## 2023-11-08 DIAGNOSIS — K8689 Other specified diseases of pancreas: Secondary | ICD-10-CM

## 2023-11-08 DIAGNOSIS — K861 Other chronic pancreatitis: Secondary | ICD-10-CM

## 2023-11-08 DIAGNOSIS — R978 Other abnormal tumor markers: Secondary | ICD-10-CM | POA: Diagnosis not present

## 2023-11-08 LAB — CBC WITH DIFFERENTIAL/PLATELET
Basophils Absolute: 0 10*3/uL (ref 0.0–0.1)
Basophils Relative: 0.6 % (ref 0.0–3.0)
Eosinophils Absolute: 0.2 10*3/uL (ref 0.0–0.7)
Eosinophils Relative: 3.8 % (ref 0.0–5.0)
HCT: 38.6 % — ABNORMAL LOW (ref 39.0–52.0)
Hemoglobin: 12.6 g/dL — ABNORMAL LOW (ref 13.0–17.0)
Lymphocytes Relative: 35.2 % (ref 12.0–46.0)
Lymphs Abs: 1.5 10*3/uL (ref 0.7–4.0)
MCHC: 32.7 g/dL (ref 30.0–36.0)
MCV: 89 fl (ref 78.0–100.0)
Monocytes Absolute: 0.5 10*3/uL (ref 0.1–1.0)
Monocytes Relative: 11.3 % (ref 3.0–12.0)
Neutro Abs: 2.1 10*3/uL (ref 1.4–7.7)
Neutrophils Relative %: 49.1 % (ref 43.0–77.0)
Platelets: 225 10*3/uL (ref 150.0–400.0)
RBC: 4.34 Mil/uL (ref 4.22–5.81)
RDW: 18.7 % — ABNORMAL HIGH (ref 11.5–15.5)
WBC: 4.3 10*3/uL (ref 4.0–10.5)

## 2023-11-08 LAB — COMPREHENSIVE METABOLIC PANEL WITH GFR
ALT: 11 U/L (ref 0–53)
AST: 9 U/L (ref 0–37)
Albumin: 3.3 g/dL — ABNORMAL LOW (ref 3.5–5.2)
Alkaline Phosphatase: 116 U/L (ref 39–117)
BUN: 25 mg/dL — ABNORMAL HIGH (ref 6–23)
CO2: 25 meq/L (ref 19–32)
Calcium: 10 mg/dL (ref 8.4–10.5)
Chloride: 108 meq/L (ref 96–112)
Creatinine, Ser: 1.68 mg/dL — ABNORMAL HIGH (ref 0.40–1.50)
GFR: 42.01 mL/min — ABNORMAL LOW (ref >60.00)
Glucose, Bld: 177 mg/dL — ABNORMAL HIGH (ref 70–99)
Potassium: 4.1 meq/L (ref 3.5–5.1)
Sodium: 139 meq/L (ref 135–145)
Total Bilirubin: 0.2 mg/dL (ref 0.2–1.2)
Total Protein: 6.5 g/dL (ref 6.0–8.3)

## 2023-11-08 LAB — LIPASE: Lipase: 361 U/L — ABNORMAL HIGH (ref 11.0–59.0)

## 2023-11-08 LAB — AMYLASE: Amylase: 116 U/L (ref 27–131)

## 2023-11-10 ENCOUNTER — Ambulatory Visit: Payer: Self-pay | Admitting: Gastroenterology

## 2023-11-11 ENCOUNTER — Other Ambulatory Visit: Payer: Self-pay

## 2023-11-11 DIAGNOSIS — R978 Other abnormal tumor markers: Secondary | ICD-10-CM

## 2023-11-11 DIAGNOSIS — K861 Other chronic pancreatitis: Secondary | ICD-10-CM

## 2023-11-11 DIAGNOSIS — K8689 Other specified diseases of pancreas: Secondary | ICD-10-CM

## 2023-11-11 LAB — CANCER ANTIGEN 19-9: CA 19-9: 224 U/mL — ABNORMAL HIGH (ref <34)

## 2023-11-13 ENCOUNTER — Telehealth: Payer: Self-pay

## 2023-11-13 NOTE — Telephone Encounter (Signed)
 Thank you Patty for this update.  I look forward to seeing the results of the CT scan. GM

## 2023-11-13 NOTE — Telephone Encounter (Signed)
 See note from schedulers below;   "Upon scheduling patient for this exam, patient stated that he had this done @ [facility] earlier today."   Dr Brice Campi I have called the pt and asked that he have the [facility]  [facility] of [facility] results when able.

## 2023-11-14 NOTE — Progress Notes (Signed)
 [facility] PancraGEN testing  CEA 73 ng/mL Amylase 21,636 units/L Glucose less than 4.3 mg/dL Cytology acellular DNA quantity equals elevated DNA quality = good quality KRAS = no mutation detected GNAS = no mutation detected Tumor suppressor genes = no loss of heterozygosity detected  Based on the PancraGEN testing, this follows into a statistically 97% probability of benign disease over the next 3 years but still carry some potential for mutational progression.  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  However, as was documented previously, he has a persistently elevated CA 19-9 .  Plan was for potential repeat EUS after repeat cross-sectional imaging was performed.  He had recent cross-sectional imaging done through the [place] rather than through the [facility] system so we need to get those results and have them uploaded to the chart and reviewed by radiology formally before EUS which is currently scheduled on 6/26.  I will have my team reach out to the patient and let them know and get a copy of the results scanned into the chart and sent patient for his records.   Yong Henle, MD [facility] Advanced [facility]oscopy Office # [phone]

## 2023-11-14 NOTE — Progress Notes (Signed)
 Spoke with the pt and made him aware of the results.  He is agreeable to proceed as planned for EUS. He will have the CT scan added to a disc and sent to our office for review and upload.

## 2023-11-15 ENCOUNTER — Other Ambulatory Visit (INDEPENDENT_AMBULATORY_CARE_PROVIDER_SITE_OTHER)

## 2023-11-15 ENCOUNTER — Ambulatory Visit: Payer: Self-pay | Admitting: Gastroenterology

## 2023-11-15 DIAGNOSIS — K861 Other chronic pancreatitis: Secondary | ICD-10-CM

## 2023-11-15 DIAGNOSIS — K8689 Other specified diseases of pancreas: Secondary | ICD-10-CM

## 2023-11-15 DIAGNOSIS — R978 Other abnormal tumor markers: Secondary | ICD-10-CM

## 2023-11-15 LAB — FOLATE: Folate: 15 ng/mL (ref >5.9)

## 2023-11-15 LAB — IBC + FERRITIN
Ferritin: 132.4 ng/mL (ref 22.0–322.0)
Iron: 61 ug/dL (ref 42–165)
Saturation Ratios: 28.9 % (ref 20.0–50.0)
TIBC: 211.4 ug/dL — ABNORMAL LOW (ref 250.0–450.0)
Transferrin: 151 mg/dL — ABNORMAL LOW (ref 212.0–360.0)

## 2023-11-15 LAB — VITAMIN B12: Vitamin B-12: 699 pg/mL (ref 211–911)

## 2023-11-22 ENCOUNTER — Ambulatory Visit (HOSPITAL_COMMUNITY)

## 2023-11-28 ENCOUNTER — Encounter: Payer: Self-pay | Admitting: Gastroenterology

## 2023-12-11 ENCOUNTER — Telehealth: Payer: Self-pay | Admitting: Gastroenterology

## 2023-12-11 NOTE — Telephone Encounter (Signed)
 Procedure:EUS Procedure date: 12/19/23 Procedure location: [facility] Arrival Time: 8:00 am Spoke with the patient Y/N:   No, I left a detailed message 12/11/23 @ 1:51 pm for the patient to return call  Yes, 12/12/23  Any prep concerns? No  Has the patient obtained the prep from the pharmacy ? No prep needed_ Do you have a care partner and transportation: Yes Any additional concerns? No

## 2023-12-13 NOTE — Progress Notes (Signed)
 Two attempts to reach pt. Over the phone were done.Message was left on voice message box.

## 2023-12-17 ENCOUNTER — Telehealth: Payer: Self-pay | Admitting: Gastroenterology

## 2023-12-17 NOTE — Telephone Encounter (Signed)
 This is a repeat test from the one that was done in May so he has already been seen by us  and we are repeating the test.  So does he still need PCP to re refer to us ?

## 2023-12-17 NOTE — Telephone Encounter (Signed)
 Call [facility] to the [facility] to speed up the approval process.  Community care office closed will call tomorrow [phone] ext [phone]

## 2023-12-17 NOTE — Telephone Encounter (Signed)
 Community care form faxed to the [place] for approval.  PA team I am not sure if you would have any approval for this since we no longer add the amb ref for hospital cases. Thank you    The pt has been advised that I am working on getting approval and will follow up with him tomorrow.

## 2023-12-17 NOTE — Telephone Encounter (Signed)
 Do we have auth for this procedure?

## 2023-12-17 NOTE — Telephone Encounter (Signed)
 Patient is scheduled for procedure at [facility] 6/26; however, we have not received the authorization from the [place].  In light of that, patient wants to cancel/reschedule his procedure until the authorization is in place.  Please call patient and advise.  Thank you.

## 2023-12-18 ENCOUNTER — Telehealth: Payer: Self-pay | Admitting: Gastroenterology

## 2023-12-18 NOTE — Telephone Encounter (Signed)
 [facility] Authorization for [facility]US  Hello Dr [place], this pt is still working on obtaining a referral from his pcp to obtain authorization from his [place] for the procedure he has scheduled for tomorrow. Unfortunately, it will not allow us  enough time to obtain authorization for that time. I spoke with the patient about the issue and he wishes to postpone the procedure until we have authorization from the [place]. He also has medicare but he wises to file under his [place].

## 2023-12-18 NOTE — Telephone Encounter (Signed)
 I spoke with the [place] Dr Rick office at [phone] ext [phone] to try and get URGENT approval for tomorrows EUS appt.  A note will be sent to the MD and patient contacted with response as soon as able.    I have notified the pt to expect a call today (later this afternoon) with a response. He will let me know what happens.  I did provide him with the number I reached out to incase he does not hear from them by early afternoon.    Time spent on the calls greater than 1 hour.

## 2023-12-18 NOTE — Telephone Encounter (Signed)
 Patty, I sent you a forwarded email with information on this patient. Please document after you have discussed things further with the patient. See if he wants to wait for me or wants referral to go to [facility] for EUS to be scheduled. Otherwise, schedule my next available EUS once schedule is released. GM

## 2023-12-18 NOTE — Telephone Encounter (Signed)
 Team, I am sorry to hear this. It is certainly up to the patient about rescheduling. Unfortunately, I do not have any more availability for this procedure (unless other cancellations occur) until September. 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 At this point, if patient has decided to postpone, then it is his right/decision. I think, the [facility] needs to be updated about this and approval will need to be obtained. He likely needs to have this procedure done at [facility] at this point, to continue to expedite his care because I cannot guarantee I can get him in while other patients are already waiting 2 months for procedures with me. If he chooses to wait, then we need to document this in the chart.  Rozann Holts, Please contact patient. May document within the telephone note your conversation. Get him scheduled first available with my September list and be first to schedule if we have a cancellation. Let him know, his other option is to have VA or us  refer him to [facility] for the procedure to be done sooner. Ultimately, his decision.  With best regards,   Aloha Finner, MD   [facility]   Advanced Therapeutic Endoscopy

## 2023-12-18 NOTE — Telephone Encounter (Signed)
 Spoke with the pt and made him aware of the recommendations.  He states he will have the [facility]/PCP refer to [facility] for further care.

## 2023-12-18 NOTE — Telephone Encounter (Signed)
 Thank you Patty for trying and helping. GM

## 2023-12-19 ENCOUNTER — Encounter (HOSPITAL_COMMUNITY): Admission: RE | Payer: Self-pay | Source: Home / Self Care

## 2023-12-19 ENCOUNTER — Ambulatory Visit (HOSPITAL_COMMUNITY): Admission: RE | Admit: 2023-12-19 | Source: Home / Self Care | Admitting: Gastroenterology

## 2023-12-19 SURGERY — ULTRASOUND, UPPER GI TRACT, ENDOSCOPIC
Anesthesia: Monitor Anesthesia Care
